# Patient Record
Sex: Female | Born: 1990 | Race: Black or African American | Hispanic: No | Marital: Single | Smoking: Never smoker
Health system: Southern US, Community
[De-identification: ages and names within clinical notes are randomized; demographics above are authoritative.]

## PROBLEM LIST (undated history)

## (undated) ENCOUNTER — Inpatient Hospital Stay (HOSPITAL_COMMUNITY): Payer: Self-pay

## (undated) ENCOUNTER — Emergency Department (HOSPITAL_COMMUNITY): Payer: Self-pay

## (undated) ENCOUNTER — Emergency Department (HOSPITAL_COMMUNITY): Admission: EM | Payer: Self-pay

## (undated) DIAGNOSIS — B999 Unspecified infectious disease: Secondary | ICD-10-CM

## (undated) DIAGNOSIS — L309 Dermatitis, unspecified: Secondary | ICD-10-CM

## (undated) DIAGNOSIS — K219 Gastro-esophageal reflux disease without esophagitis: Secondary | ICD-10-CM

## (undated) DIAGNOSIS — Z862 Personal history of diseases of the blood and blood-forming organs and certain disorders involving the immune mechanism: Secondary | ICD-10-CM

## (undated) DIAGNOSIS — T7840XA Allergy, unspecified, initial encounter: Secondary | ICD-10-CM

## (undated) DIAGNOSIS — A599 Trichomoniasis, unspecified: Secondary | ICD-10-CM

## (undated) DIAGNOSIS — D649 Anemia, unspecified: Secondary | ICD-10-CM

## (undated) DIAGNOSIS — A749 Chlamydial infection, unspecified: Secondary | ICD-10-CM

## (undated) DIAGNOSIS — A549 Gonococcal infection, unspecified: Secondary | ICD-10-CM

## (undated) HISTORY — DX: Anemia, unspecified: D64.9

## (undated) HISTORY — DX: Allergy, unspecified, initial encounter: T78.40XA

## (undated) HISTORY — DX: Gastro-esophageal reflux disease without esophagitis: K21.9

## (undated) HISTORY — PX: OTHER SURGICAL HISTORY: SHX169

## (undated) HISTORY — PX: WISDOM TOOTH EXTRACTION: SHX21

---
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
## 2012-06-14 ENCOUNTER — Inpatient Hospital Stay (HOSPITAL_COMMUNITY)
Admission: AD | Admit: 2012-06-14 | Discharge: 2012-06-14 | Disposition: A | Discharge status: Home / Self Care | Payer: Medicaid Other | Source: Ambulatory Visit | Attending: Family Medicine | Admitting: Family Medicine

## 2012-06-14 ENCOUNTER — Emergency Department (HOSPITAL_COMMUNITY)
Admission: EM | Admit: 2012-06-14 | Discharge: 2012-06-14 | Disposition: A | Discharge status: Home / Self Care | Payer: Self-pay | Attending: Emergency Medicine | Admitting: Emergency Medicine

## 2012-06-14 ENCOUNTER — Encounter (HOSPITAL_COMMUNITY): Payer: Self-pay | Admitting: *Deleted

## 2012-06-14 ENCOUNTER — Encounter (HOSPITAL_COMMUNITY): Payer: Self-pay | Admitting: Emergency Medicine

## 2012-06-14 DIAGNOSIS — N9489 Other specified conditions associated with female genital organs and menstrual cycle: Secondary | ICD-10-CM | POA: Insufficient documentation

## 2012-06-14 DIAGNOSIS — N898 Other specified noninflammatory disorders of vagina: Secondary | ICD-10-CM | POA: Insufficient documentation

## 2012-06-14 DIAGNOSIS — N9089 Other specified noninflammatory disorders of vulva and perineum: Secondary | ICD-10-CM

## 2012-06-14 DIAGNOSIS — N909 Noninflammatory disorder of vulva and perineum, unspecified: Secondary | ICD-10-CM | POA: Insufficient documentation

## 2012-06-14 HISTORY — DX: Unspecified infectious disease: B99.9

## 2012-06-14 HISTORY — DX: Dermatitis, unspecified: L30.9

## 2012-06-14 LAB — WET PREP, GENITAL: Trich, Wet Prep: NONE SEEN

## 2012-06-14 LAB — URINALYSIS, ROUTINE W REFLEX MICROSCOPIC
Bilirubin Urine: NEGATIVE
Glucose, UA: NEGATIVE mg/dL
Protein, ur: NEGATIVE mg/dL
Specific Gravity, Urine: 1.025 (ref 1.005–1.030)

## 2012-06-14 LAB — URINE MICROSCOPIC-ADD ON

## 2012-06-14 MED ORDER — TRIAMCINOLONE ACETONIDE 0.1 % EX CREA
TOPICAL_CREAM | Freq: Two times a day (BID) | CUTANEOUS | Status: DC
Start: 2012-06-14 — End: 2013-03-03

## 2012-06-14 NOTE — MAU Note (Signed)
Pt reports she noticed some swelling in her vaginal area that started today. Has some white discharge as well

## 2012-06-14 NOTE — ED Notes (Signed)
Pt c/o swelling to vaginal area onset today. Pt reports white discharge noted yesterday. Pt has had odor in urine x 1 month.

## 2012-06-14 NOTE — MAU Provider Note (Signed)
History     CSN: [id number]  Arrival date and time: 06/14/12 1758   First Provider Initiated Contact with Patient 06/14/12 1839      Chief Complaint  Patient presents with  . Groin Swelling   HPI [name] [name] 21 y.o. Comes to MAU with vulvar swelling which began this morning.  Denies any intercourse this week.    OB History    Grav Para Term Preterm Abortions TAB SAB Ect Mult Living   2 2 2       2       Past Medical History  Diagnosis Date  . Eczema   . Infection     chlamydia    Past Surgical History  Procedure Date  . No past surgeries     Family History  Problem Relation Age of Onset  . Other Neg Hx     History  Substance Use Topics  . Smoking status: Former Smoker    Types: Cigarettes  . Smokeless tobacco: Never Used  . Alcohol Use: No    Allergies: No Known Allergies  No prescriptions prior to admission    Review of Systems  Constitutional: Negative for fever.  Gastrointestinal: Negative for nausea, vomiting and abdominal pain.  Genitourinary: Negative for dysuria.       Vaginal discharge Vulvar swelling   Physical Exam   Blood pressure 132/65, pulse 70, temperature 97.5 F (36.4 C), temperature source Oral, resp. rate 18, height 5' 3.5" (1.613 m), weight 65.318 kg (144 lb), last menstrual period 06/07/2012, not currently breastfeeding.  Physical Exam  Nursing note and vitals reviewed. Constitutional: She is oriented to person, place, and time. She appears well-developed and well-nourished.  HENT:  Head: Normocephalic.  Eyes: EOM are normal.  Neck: Neck supple.  GI: Soft. There is no tenderness.  Genitourinary:       Entire clitoral hood is edematous as well as entire right labia minora.  No change in left labia minora.  No lesion seen. Speculum exam: Vagina - Small amount of creamy discharge, no odor Cervix - No contact bleeding Bimanual exam: Cervix closed Uterus non tender, normal size Adnexa non tender, no masses  bilaterally GC/Chlam, wet prep done Chaperone present for exam.  Musculoskeletal: Normal range of motion.  Neurological: She is alert and oriented to person, place, and time.  Skin: Skin is warm and dry.  Psychiatric: She has a normal mood and affect.    MAU Course  Procedures  MDM Results for orders placed during the hospital encounter of 06/14/12 (from the past 24 hour(s))  POCT PREGNANCY, URINE     Status: Normal   Collection Time   06/14/12  6:40 PM      Component Value Range   Preg Test, Ur NEGATIVE  NEGATIVE  WET PREP, GENITAL     Status: Abnormal   Collection Time   06/14/12  6:42 PM      Component Value Range   Yeast Wet Prep HPF POC NONE SEEN  NONE SEEN   Trich, Wet Prep NONE SEEN  NONE SEEN   Clue Cells Wet Prep HPF POC FEW (*) NONE SEEN   WBC, Wet Prep HPF POC FEW (*) NONE SEEN   Talked with client at length.  Has eczema which has flared lately.  Has recently changed laundry detergent, uses dryer sheets.  Also has "boy shorts" underwear which she frequently has to pull down from riding up.  Edema could be from contact dermatitis or irritation from clothing.  Assessment and  Plan  Vulvar swelling - possibly a contact dermatitis  Plan Change underwear to a type that does not ride up.   Avoid tight jeans and other tight clothing. Do not use dryer sheets. Change to a hypoallergenic body was  - Aveeno Use the same type of laundry detergent all the time. No infection is found today.  Cultures are pending. Try Benadryl over the counter for relief of swelling. rx Triamcinalone cream to affected area.  BURLESON,TERRI 06/14/2012, 7:11 PM

## 2012-06-14 NOTE — MAU Note (Addendum)
Swelling 'down there'.  (clitoral).  Noted this morning.  Was feeling a little irritated.  Denies recent intercourse, no stimulation (manual or vibrator). Changed to a scented ivory liquid soap, no other new changes. Past few wks urine has had a strong odor.

## 2012-06-14 NOTE — ED Notes (Signed)
Pt reports she is [facility]. Explained to pt we can see her here and she states that she would like to be seen at [facility] due to it being more appropriate.

## 2012-06-15 NOTE — MAU Provider Note (Signed)
Chart reviewed and agree with management and plan.  

## 2012-12-15 ENCOUNTER — Encounter (HOSPITAL_COMMUNITY): Payer: Self-pay | Admitting: *Deleted

## 2012-12-15 ENCOUNTER — Inpatient Hospital Stay (HOSPITAL_COMMUNITY)
Admission: AD | Admit: 2012-12-15 | Discharge: 2012-12-16 | Disposition: A | Discharge status: Home / Self Care | Payer: Self-pay | Source: Ambulatory Visit | Attending: Obstetrics & Gynecology | Admitting: Obstetrics & Gynecology

## 2012-12-15 DIAGNOSIS — O034 Incomplete spontaneous abortion without complication: Secondary | ICD-10-CM

## 2012-12-15 DIAGNOSIS — O021 Missed abortion: Secondary | ICD-10-CM | POA: Insufficient documentation

## 2012-12-15 DIAGNOSIS — R109 Unspecified abdominal pain: Secondary | ICD-10-CM | POA: Insufficient documentation

## 2012-12-15 LAB — CBC
HCT: 34.2 % — ABNORMAL LOW (ref 36.0–46.0)
MCH: 27.3 pg (ref 26.0–34.0)
MCHC: 32.7 g/dL (ref 30.0–36.0)
MCV: 83.4 fL (ref 78.0–100.0)
RDW: 16.4 % — ABNORMAL HIGH (ref 11.5–15.5)
WBC: 11.3 10*3/uL — ABNORMAL HIGH (ref 4.0–10.5)

## 2012-12-15 NOTE — MAU Note (Signed)
PT SAYS SHE DID PREG TEST IN MARCH 1,  POST.    PT SAYS SHE STARTED SPOTTING  ON 4-15-  - THEN OFF/ON  UNTIL   TODAY- THEN WORSE AT 6PM.   HAS A WASH CLOTH- IN UNDERWEAR- - SMALL AMT LIGHT RED  ON BLUE CLOTH.    SAYS PASSED CLOTS - NICKEL SIZE.     FEELS CRAMPS-  WHILE AT WORK-   WORSE LAST NIGHT.  Marland Kitchen  LAST SEX-  4-15.  NO BIRTH CONTROL.      DR MARSHALL AND J-M DEL- OTHER BABIES

## 2012-12-16 ENCOUNTER — Encounter (HOSPITAL_COMMUNITY): Payer: Self-pay

## 2012-12-16 ENCOUNTER — Inpatient Hospital Stay (HOSPITAL_COMMUNITY): Payer: Self-pay

## 2012-12-16 DIAGNOSIS — O034 Incomplete spontaneous abortion without complication: Secondary | ICD-10-CM

## 2012-12-16 LAB — WET PREP, GENITAL: Yeast Wet Prep HPF POC: NONE SEEN

## 2012-12-16 MED ORDER — HYDROCODONE-ACETAMINOPHEN 5-325 MG PO TABS: 1.0000 | ORAL_TABLET | Freq: Four times a day (QID) | ORAL | Status: DC | PRN

## 2012-12-16 MED ORDER — MISOPROSTOL 200 MCG PO TABS
800.0000 ug | ORAL_TABLET | Freq: Once | ORAL | Status: AC
  Administered 2012-12-16: 800 ug via VAGINAL
  Filled 2012-12-16: qty 4

## 2012-12-16 MED ORDER — PROMETHAZINE HCL 25 MG PO TABS: 25.0000 mg | ORAL_TABLET | Freq: Four times a day (QID) | ORAL | Status: DC | PRN

## 2012-12-16 NOTE — MAU Provider Note (Signed)
History     CSN: [id number]  Arrival date and time: 12/15/12 2213   First Provider Initiated Contact with Patient 12/16/12 0010      No chief complaint on file.  HPI This is a 22 y.o. female at about [redacted] weeks gestation who presents with c/o bleeding and cramping for one week. States was worse last night and today.   RN Note: PT SAYS SHE DID PREG TEST IN MARCH 1, POST. PT SAYS SHE STARTED SPOTTING ON 4-15- - THEN OFF/ON UNTIL TODAY- THEN WORSE AT 6PM. HAS A WASH CLOTH- IN UNDERWEAR- - SMALL AMT LIGHT RED ON BLUE CLOTH. SAYS PASSED CLOTS - NICKEL SIZE. FEELS CRAMPS- WHILE AT WORK- WORSE LAST NIGHT. Marland Kitchen LAST SEX- 4-15. NO BIRTH CONTROL. DR MARSHALL AND J-M DEL- OTHER BABIES  OB History   Grav Para Term Preterm Abortions TAB SAB Ect Mult Living   2 2 2       2       Past Medical History  Diagnosis Date  . Eczema   . Infection     chlamydia    Past Surgical History  Procedure Laterality Date  . No past surgeries      Family History  Problem Relation Age of Onset  . Other Neg Hx     History  Substance Use Topics  . Smoking status: Former Smoker    Types: Cigarettes  . Smokeless tobacco: Never Used  . Alcohol Use: No    Allergies: No Known Allergies  Prescriptions prior to admission  Medication Sig Dispense Refill  . cetirizine (ZYRTEC) 10 MG tablet Take 5 mg by mouth daily.      Marland Kitchen triamcinolone cream (KENALOG) 0.1 % Apply topically 2 (two) times daily.  30 g  0    Review of Systems  Constitutional: Negative for fever, chills and malaise/fatigue.  Gastrointestinal: Positive for abdominal pain. Negative for nausea, vomiting, diarrhea and constipation.  Genitourinary: Negative for dysuria.       Vaginal bleeding  Neurological: Negative for weakness and headaches.   Physical Exam   Blood pressure 121/67, pulse 97, temperature 98.1 F (36.7 C), temperature source Oral, resp. rate 20, height 5\' 2"  (1.575 m), weight 169 lb 8 oz (76.885 kg), last menstrual period  10/05/2012.  Physical Exam  Constitutional: She is oriented to person, place, and time. She appears well-developed and well-nourished. No distress.  Cardiovascular: Normal rate.   Respiratory: Effort normal.  GI: Soft. She exhibits no distension and no mass. There is tenderness (over uterus). There is no rebound and no guarding.  Genitourinary: Uterus normal. Vaginal discharge (moderate blood in vault, some membranous tissue at os) found.  Musculoskeletal: Normal range of motion.  Neurological: She is alert and oriented to person, place, and time.  Skin: Skin is warm and dry.  Psychiatric: She has a normal mood and affect.   Results for orders placed during the hospital encounter of 12/15/12 (from the past 24 hour(s))  HCG, QUANTITATIVE, PREGNANCY     Status: Abnormal   Collection Time    12/15/12 10:59 PM      Result Value Range   hCG, Beta Chain, Quant, S 5801 (*) <5 mIU/mL  CBC     Status: Abnormal   Collection Time    12/15/12 10:59 PM      Result Value Range   WBC 11.3 (*) 4.0 - 10.5 K/uL   RBC 4.10  3.87 - 5.11 MIL/uL   Hemoglobin 11.2 (*) 12.0 - 15.0 g/dL  HCT 34.2 (*) 36.0 - 46.0 %   MCV 83.4  78.0 - 100.0 fL   MCH 27.3  26.0 - 34.0 pg   MCHC 32.7  30.0 - 36.0 g/dL   RDW 96.0 (*) 45.4 - 09.8 %   Platelets 282  150 - 400 K/uL  WET PREP, GENITAL     Status: Abnormal   Collection Time    12/16/12 12:15 AM      Result Value Range   Yeast Wet Prep HPF POC NONE SEEN  NONE SEEN   Trich, Wet Prep NONE SEEN  NONE SEEN   Clue Cells Wet Prep HPF POC FEW (*) NONE SEEN   WBC, Wet Prep HPF POC FEW (*) NONE SEEN  US Ob Transvaginal  12/16/2012  *RADIOLOGY REPORT*  Clinical Data: 10 weeks 2 days pregnant by last menstrual period. Spotting for 1 week.  OBSTETRIC <14 WK [place] AND TRANSVAGINAL OB US  Technique:  Both transabdominal and transvaginal ultrasound examinations were performed for complete evaluation of the gestation as well as the maternal uterus, adnexal regions, and pelvic  cul-de-sac.  Transvaginal technique was performed to assess early pregnancy.  Comparison:  10/10/2009    Intrauterine gestational sac:  Visualized Yolk sac: Not visualized Embryo: Visualized Cardiac Activity: Absent  CRL: 21 mm  mm  8 w  5 d            [place] EDC: 07/23/2013.  Maternal uterus/adnexae: No subchorionic hemorrhage.  Both ovaries are normal in appearance, without adnexal mass. No significant free fluid.    IMPRESSION: Fetal pole with crown-rump length of 21 mm.  Absent cardiac activity, consistent with fetal demise.  Findings were relayed to the referring, Devoria Glassing, by the technologist, prior to this dictation at 1:30 am.     Original Report Authenticated By: Jeronimo Greaves, M.D.     MAU Course  Procedures  MDM  Discussed options of expectant management vs Cytotec.  Patient decided on cytotec but cannot bring herself to insert it herself at home. Wants it done here.   Early Intrauterine Pregnancy Failure Protocol  X Documented intrauterine pregnancy failure less than or equal to [redacted] weeks gestation  X No serious current illness  X Baseline Hgb greater than or equal to 10g/dl  X Patient has easily accessible transportation to the hospital  X Clear preference  X Practitioner/physician deems patient reliable  X Counseling by practitioner or physician  X Patient education by RN  X Consent form signed  Rho-Gam given by RN if indicated  X Medication dispensed  __ Cytotec 800 mcg Intravaginally by RN X Ibuprofen 600 mg 1 tablet by mouth every 6 hours as needed #30 - prescribed  X Vicodin by mouth every 4 to 6 hours as needed - prescribed  _x_ Phenergan 12.5 mg by mouth every 4 hours as needed for nausea - prescribed     Assessment and Plan  A:  SIUP at 10.2 weeks with fetal pole at 8+ weeks size      Failed IUP/Missed AB  P:  Cytotec inserted       Discharge home      Rx'es provided     Medication List    TAKE these medications       cetirizine 10 MG tablet   Commonly known as:  ZYRTEC  Take 5 mg by mouth daily.     HYDROcodone-acetaminophen 5-325 MG per tablet  Commonly known as:  NORCO/VICODIN  Take 1 tablet by mouth every 6 (six)  hours as needed for pain.     promethazine 25 MG tablet  Commonly known as:  PHENERGAN  Take 1 tablet (25 mg total) by mouth every 6 (six) hours as needed for nausea.     triamcinolone cream 0.1 %  Commonly known as:  KENALOG  Apply topically 2 (two) times daily.       Repeat quant in clinic in one week   Bleeding precautions   [facility] 12/16/2012, 1:04 AM

## 2012-12-17 NOTE — MAU Provider Note (Signed)
Attestation of Attending Supervision of Advanced Practitioner (PA/CNM/NP): Evaluation and management procedures were performed by the Advanced Practitioner under my supervision and collaboration.  I have reviewed the Advanced Practitioner's note and chart, and I agree with the management and plan.  Khaiden Segreto, MD, FACOG Attending Obstetrician & Gynecologist Faculty Practice, [facility]  

## 2012-12-23 ENCOUNTER — Other Ambulatory Visit: Payer: Self-pay

## 2013-01-27 ENCOUNTER — Encounter: Payer: Self-pay | Admitting: Obstetrics & Gynecology

## 2013-03-03 ENCOUNTER — Encounter (HOSPITAL_COMMUNITY): Payer: Self-pay | Admitting: *Deleted

## 2013-03-03 ENCOUNTER — Inpatient Hospital Stay (HOSPITAL_COMMUNITY)
Admission: AD | Admit: 2013-03-03 | Discharge: 2013-03-03 | Disposition: A | Discharge status: Home / Self Care | Payer: Self-pay | Source: Ambulatory Visit | Attending: Obstetrics & Gynecology | Admitting: Obstetrics & Gynecology

## 2013-03-03 DIAGNOSIS — R319 Hematuria, unspecified: Secondary | ICD-10-CM

## 2013-03-03 DIAGNOSIS — R109 Unspecified abdominal pain: Secondary | ICD-10-CM | POA: Insufficient documentation

## 2013-03-03 DIAGNOSIS — R1030 Lower abdominal pain, unspecified: Secondary | ICD-10-CM

## 2013-03-03 DIAGNOSIS — D573 Sickle-cell trait: Secondary | ICD-10-CM | POA: Insufficient documentation

## 2013-03-03 DIAGNOSIS — R3129 Other microscopic hematuria: Secondary | ICD-10-CM | POA: Insufficient documentation

## 2013-03-03 LAB — URINE MICROSCOPIC-ADD ON

## 2013-03-03 LAB — CBC WITH DIFFERENTIAL/PLATELET
Basophils Relative: 1 % (ref 0–1)
Eosinophils Absolute: 0.6 10*3/uL (ref 0.0–0.7)
Lymphs Abs: 2.3 10*3/uL (ref 0.7–4.0)
MCH: 24.5 pg — ABNORMAL LOW (ref 26.0–34.0)
MCHC: 31.4 g/dL (ref 30.0–36.0)
Neutrophils Relative %: 50 % (ref 43–77)
Platelets: 342 10*3/uL (ref 150–400)
RBC: 3.68 MIL/uL — ABNORMAL LOW (ref 3.87–5.11)

## 2013-03-03 LAB — URINALYSIS, ROUTINE W REFLEX MICROSCOPIC
Ketones, ur: NEGATIVE mg/dL
Nitrite: NEGATIVE
pH: 7.5 (ref 5.0–8.0)

## 2013-03-03 LAB — WET PREP, GENITAL
Trich, Wet Prep: NONE SEEN
Yeast Wet Prep HPF POC: NONE SEEN

## 2013-03-03 MED ORDER — CIPROFLOXACIN HCL 250 MG PO TABS: 500.0000 mg | ORAL_TABLET | Freq: Two times a day (BID) | ORAL | Status: DC

## 2013-03-03 NOTE — MAU Note (Signed)
Has been having cramping in lower abd for the past month.  At times feels like cycle is going to start, when she is not due.  Feels like stomach is getting bigger.  Did a home test and it was negative.

## 2013-03-03 NOTE — MAU Provider Note (Signed)
History     CSN: [id number]  Arrival date and time: 03/03/13 1428   First Provider Initiated Contact with Patient 03/03/13 1449      Chief Complaint  Patient presents with  . Abdominal Pain   HPI [name] [name] is 22 y.o. [id number] presents with abdominal pain x 1 month.  Describes as cramping even when not related to menses.   Worst pain 4-5/10, intermittent.  Now rates it as 2/10.  Has not tried OTC pain meds.  Nothing makes it worse or better.  Denies nausea, vomiting, UTI, or abnormal vaginal discharge or bleeding.  Last bowel movement yesterday, normal.  Periods are monthly lasting 6 days.  Sexually active X 1 partner for 3-4 months.  Not currently using contraception.  Wants her tubes tied but told she was too young.  Past Medical History  Diagnosis Date  . Eczema   . Infection     chlamydia    Past Surgical History  Procedure Laterality Date  . No past surgeries      Family History  Problem Relation Age of Onset  . Other Neg Hx   . Asthma Sister   . Heart disease Maternal Grandmother     History  Substance Use Topics  . Smoking status: Never Smoker   . Smokeless tobacco: Never Used  . Alcohol Use: No    Allergies: No Known Allergies  No prescriptions prior to admission    Review of Systems  Constitutional: Negative for fever and chills.  Gastrointestinal: Positive for abdominal pain. Negative for nausea, vomiting, diarrhea and constipation.  Genitourinary:       Negative for vaginal discharge or bleeding  Neurological: Negative for headaches.   Physical Exam   Blood pressure 112/67, pulse 95, temperature 98.2 F (36.8 C), temperature source Oral, resp. rate 18, height 5' 2.5" (1.588 m), weight 169 lb (76.658 kg), last menstrual period 02/14/2013.  Physical Exam  Constitutional: She is oriented to person, place, and time. She appears well-developed and well-nourished. No distress.  HENT:  Head: Normocephalic.  Neck: Normal range of motion.   Cardiovascular: Normal rate.   Respiratory: Effort normal.  GI: Soft. She exhibits no distension and no mass. There is tenderness. There is no rebound (mild  suprapubic discomfort) and no guarding.  Genitourinary: There is no rash, tenderness or lesion on the right labia. There is no rash, tenderness or lesion on the left labia. Uterus is not enlarged and not tender. Cervix exhibits no motion tenderness, no discharge and no friability. Right adnexum displays no mass, no tenderness and no fullness. Left adnexum displays no mass, no tenderness and no fullness. No erythema, tenderness or bleeding around the vagina. No foreign body around the vagina. Vaginal discharge (small amount of frothy discharge with mild odor) found.  Neurological: She is alert and oriented to person, place, and time.  Skin: Skin is warm and dry.  Psychiatric: She has a normal mood and affect. Her behavior is normal.   Results for orders placed during the hospital encounter of 03/03/13 (from the past 24 hour(s))  URINALYSIS, ROUTINE W REFLEX MICROSCOPIC     Status: Abnormal   Collection Time    03/03/13  2:50 PM      Result Value Range   Color, Urine YELLOW  YELLOW   APPearance CLOUDY (*) CLEAR   Specific Gravity, Urine 1.020  1.005 - 1.030   pH 7.5  5.0 - 8.0   Glucose, UA NEGATIVE  NEGATIVE mg/dL   Hgb urine  dipstick MODERATE (*) NEGATIVE   Bilirubin Urine NEGATIVE  NEGATIVE   Ketones, ur NEGATIVE  NEGATIVE mg/dL   Protein, ur NEGATIVE  NEGATIVE mg/dL   Urobilinogen, UA 0.2  0.0 - 1.0 mg/dL   Nitrite NEGATIVE  NEGATIVE   Leukocytes, UA LARGE (*) NEGATIVE  POCT PREGNANCY, URINE     Status: None   Collection Time    03/03/13  2:50 PM      Result Value Range   Preg Test, Ur NEGATIVE  NEGATIVE  URINE MICROSCOPIC-ADD ON     Status: Abnormal   Collection Time    03/03/13  2:50 PM      Result Value Range   Squamous Epithelial / LPF MANY (*) RARE   WBC, UA 11-20  <3 WBC/hpf   RBC / HPF 11-20  <3 RBC/hpf  CBC WITH  DIFFERENTIAL     Status: Abnormal   Collection Time    03/03/13  3:20 PM      Result Value Range   WBC 7.0  4.0 - 10.5 K/uL   RBC 3.68 (*) 3.87 - 5.11 MIL/uL   Hemoglobin 9.0 (*) 12.0 - 15.0 g/dL   HCT 40.9 (*) 81.1 - 91.4 %   MCV 78.0  78.0 - 100.0 fL   MCH 24.5 (*) 26.0 - 34.0 pg   MCHC 31.4  30.0 - 36.0 g/dL   RDW 78.2  95.6 - 21.3 %   Platelets 342  150 - 400 K/uL   Neutrophils Relative % 50  43 - 77 %   Neutro Abs 3.5  1.7 - 7.7 K/uL   Lymphocytes Relative 34  12 - 46 %   Lymphs Abs 2.3  0.7 - 4.0 K/uL   Monocytes Relative 8  3 - 12 %   Monocytes Absolute 0.5  0.1 - 1.0 K/uL   Eosinophils Relative 8 (*) 0 - 5 %   Eosinophils Absolute 0.6  0.0 - 0.7 K/uL   Basophils Relative 1  0 - 1 %   Basophils Absolute 0.1  0.0 - 0.1 K/uL  WET PREP, GENITAL     Status: Abnormal   Collection Time    03/03/13  3:40 PM      Result Value Range   Yeast Wet Prep HPF POC NONE SEEN  NONE SEEN   Trich, Wet Prep NONE SEEN  NONE SEEN   Clue Cells Wet Prep HPF POC NONE SEEN  NONE SEEN   WBC, Wet Prep HPF POC MANY (*) NONE SEEN    MAU Course  Procedures GC/CHL culture to lab MDM  Discussed labs with the patient.  When questioned her about low HGB, she reports sickle cell trait with history of low hgb.   Her sister has the disease.   Assessment and Plan  A:  Suprapubic pain with microscopic hematuria      Low Hemoglobin     Known sickle cell trait with hx of low hgb  P:  Rx for Cipro 250mg  bid X 3 days to pharmacy      Increase po fluids             Daris Harkins,EVE M 03/03/2013, 4:31 PM

## 2013-03-03 NOTE — MAU Provider Note (Signed)
Attestation of Attending Supervision of Advanced Practitioner (PA/CNM/NP): Evaluation and management procedures were performed by the Advanced Practitioner under my supervision and collaboration.  I have reviewed the Advanced Practitioner's note and chart, and I agree with the management and plan.  Kacie Huxtable, MD, FACOG Attending Obstetrician & Gynecologist Faculty Practice, [facility]  

## 2013-05-07 ENCOUNTER — Encounter (HOSPITAL_COMMUNITY): Payer: Self-pay | Admitting: *Deleted

## 2013-05-07 ENCOUNTER — Inpatient Hospital Stay (HOSPITAL_COMMUNITY)
Admission: AD | Admit: 2013-05-07 | Discharge: 2013-05-07 | Disposition: A | Discharge status: Home / Self Care | Payer: Self-pay | Source: Ambulatory Visit | Attending: Obstetrics & Gynecology | Admitting: Obstetrics & Gynecology

## 2013-05-07 ENCOUNTER — Emergency Department (HOSPITAL_COMMUNITY)
Admission: EM | Admit: 2013-05-07 | Discharge: 2013-05-07 | Discharge status: Against Medical Advice | Payer: Self-pay | Attending: Emergency Medicine | Admitting: Emergency Medicine

## 2013-05-07 ENCOUNTER — Encounter (HOSPITAL_COMMUNITY): Payer: Self-pay | Admitting: Family Medicine

## 2013-05-07 DIAGNOSIS — Z862 Personal history of diseases of the blood and blood-forming organs and certain disorders involving the immune mechanism: Secondary | ICD-10-CM | POA: Insufficient documentation

## 2013-05-07 DIAGNOSIS — O9989 Other specified diseases and conditions complicating pregnancy, childbirth and the puerperium: Secondary | ICD-10-CM | POA: Insufficient documentation

## 2013-05-07 DIAGNOSIS — A599 Trichomoniasis, unspecified: Secondary | ICD-10-CM | POA: Insufficient documentation

## 2013-05-07 DIAGNOSIS — Z3201 Encounter for pregnancy test, result positive: Secondary | ICD-10-CM | POA: Insufficient documentation

## 2013-05-07 DIAGNOSIS — R52 Pain, unspecified: Secondary | ICD-10-CM | POA: Insufficient documentation

## 2013-05-07 DIAGNOSIS — Z3490 Encounter for supervision of normal pregnancy, unspecified, unspecified trimester: Secondary | ICD-10-CM

## 2013-05-07 DIAGNOSIS — Z872 Personal history of diseases of the skin and subcutaneous tissue: Secondary | ICD-10-CM | POA: Insufficient documentation

## 2013-05-07 DIAGNOSIS — N898 Other specified noninflammatory disorders of vagina: Secondary | ICD-10-CM | POA: Insufficient documentation

## 2013-05-07 DIAGNOSIS — R252 Cramp and spasm: Secondary | ICD-10-CM | POA: Insufficient documentation

## 2013-05-07 LAB — POCT PREGNANCY, URINE: Preg Test, Ur: POSITIVE — AB

## 2013-05-07 LAB — CBC WITH DIFFERENTIAL/PLATELET
Basophils Absolute: 0 10*3/uL (ref 0.0–0.1)
Basophils Relative: 0 % (ref 0–1)
Eosinophils Absolute: 0.4 10*3/uL (ref 0.0–0.7)
Eosinophils Relative: 7 % — ABNORMAL HIGH (ref 0–5)
HCT: 32.5 % — ABNORMAL LOW (ref 36.0–46.0)
Hemoglobin: 10.5 g/dL — ABNORMAL LOW (ref 12.0–15.0)
MCH: 25.1 pg — ABNORMAL LOW (ref 26.0–34.0)
MCHC: 32.3 g/dL (ref 30.0–36.0)
MCV: 77.8 fL — ABNORMAL LOW (ref 78.0–100.0)
Monocytes Absolute: 0.4 10*3/uL (ref 0.1–1.0)
Monocytes Relative: 9 % (ref 3–12)
Neutro Abs: 2 10*3/uL (ref 1.7–7.7)
RDW: 17.5 % — ABNORMAL HIGH (ref 11.5–15.5)

## 2013-05-07 LAB — RETICULOCYTES
Retic Count, Absolute: 37.6 10*3/uL (ref 19.0–186.0)
Retic Ct Pct: 0.9 % (ref 0.4–3.1)

## 2013-05-07 LAB — URINE MICROSCOPIC-ADD ON

## 2013-05-07 LAB — URINALYSIS, ROUTINE W REFLEX MICROSCOPIC
Bilirubin Urine: NEGATIVE
Glucose, UA: NEGATIVE mg/dL
Ketones, ur: 15 mg/dL — AB
Nitrite: NEGATIVE
Protein, ur: 30 mg/dL — AB
pH: 6.5 (ref 5.0–8.0)

## 2013-05-07 LAB — COMPREHENSIVE METABOLIC PANEL
ALT: 15 U/L (ref 0–35)
BUN: 5 mg/dL — ABNORMAL LOW (ref 6–23)
CO2: 25 mEq/L (ref 19–32)
Calcium: 9.4 mg/dL (ref 8.4–10.5)
GFR calc Af Amer: >90 mL/min (ref >90)
GFR calc non Af Amer: >90 mL/min (ref >90)
Glucose, Bld: 91 mg/dL (ref 70–99)
Sodium: 135 mEq/L (ref 135–145)
Total Protein: 8 g/dL (ref 6.0–8.3)

## 2013-05-07 LAB — WET PREP, GENITAL: Clue Cells Wet Prep HPF POC: NONE SEEN

## 2013-05-07 MED ORDER — METRONIDAZOLE 500 MG PO TABS: 2000.0000 mg | ORAL_TABLET | Freq: Once | ORAL | Status: DC

## 2013-05-07 NOTE — ED Provider Notes (Signed)
CSN: [id number]     Arrival date & time 05/07/13  0945 History   First MD Initiated Contact with Patient 05/07/13 1001     Chief Complaint  Patient presents with  . Generalized Body Aches   (Consider location/radiation/quality/duration/timing/severity/associated sxs/prior Treatment) The history is provided by the patient and medical records.   Patient presents to the ED for generalized weakness, fatigue, and occasional cramping in upper and lower extremities occuring intermittently for the past 6 months.  No sx at present, last occurrence was yesterday afternoon. Patient does have sickle cell trait, both parents have sickle cell anemia.  Patient also endorses some new, white, nonodorous vaginal discharge for the past week. Denies any new sexual partners or concern for STD at this time. Patient does have a history of Chlamydia. No abdominal pain, nausea, vomiting, or diarrhea. No fevers, sweats, or chills.  No cough, congestion, shortness of breath, or other URI symptoms  No sick contacts.  Med record review-- pt has hx of recurrent anemia.  Past Medical History  Diagnosis Date  . Eczema   . Infection     chlamydia   Past Surgical History  Procedure Laterality Date  . No past surgeries     Family History  Problem Relation Age of Onset  . Other Neg Hx   . Asthma Sister   . Heart disease Maternal Grandmother    History  Substance Use Topics  . Smoking status: Never Smoker   . Smokeless tobacco: Never Used  . Alcohol Use: No   OB History   Grav Para Term Preterm Abortions TAB SAB Ect Mult Living   3 2 2  1  1   2      Review of Systems  Constitutional: Positive for fatigue.  Genitourinary: Positive for vaginal discharge.  All other systems reviewed and are negative.    Allergies  Review of patient's allergies indicates no known allergies.  Home Medications  No current outpatient prescriptions on file. BP 124/75  Pulse 93  Temp(Src) 97.7 F (36.5 C) (Oral)  Resp 20   SpO2 97%  LMP 04/07/2013  Physical Exam  Nursing note and vitals reviewed. Constitutional: She is oriented to person, place, and time. She appears well-developed and well-nourished. No distress.  HENT:  Head: Normocephalic and atraumatic.  Mouth/Throat: Oropharynx is clear and moist.  Eyes: Conjunctivae and EOM are normal. Pupils are equal, round, and reactive to light.  Neck: Normal range of motion. Neck supple.  Cardiovascular: Normal rate, regular rhythm and normal heart sounds.   Pulmonary/Chest: Effort normal and breath sounds normal. No respiratory distress. She has no wheezes.  Abdominal: Soft. Bowel sounds are normal. There is no tenderness. There is no guarding.  Genitourinary: There is no lesion on the right labia. There is no lesion on the left labia. Cervix exhibits no motion tenderness. Right adnexum displays no mass and no tenderness. Left adnexum displays no mass and no tenderness. No tenderness or bleeding around the vagina. Vaginal discharge found.  Cervical os closed; Copious, purulent, malodorous vaginal discharge; no bleeding; no adnexal or CMT  Musculoskeletal: Normal range of motion.  Neurological: She is alert and oriented to person, place, and time.  Skin: Skin is warm and dry. She is not diaphoretic.  Psychiatric: She has a normal mood and affect.    ED Course  Procedures (including critical care time) Labs Review Labs Reviewed  CBC WITH DIFFERENTIAL - Abnormal; Notable for the following:    Hemoglobin 10.5 (*)    HCT  32.5 (*)    MCV 77.8 (*)    MCH 25.1 (*)    RDW 17.5 (*)    Neutrophils Relative % 41 (*)    Eosinophils Relative 7 (*)    All other components within normal limits  COMPREHENSIVE METABOLIC PANEL - Abnormal; Notable for the following:    BUN 5 (*)    All other components within normal limits  WET PREP, GENITAL  GC/CHLAMYDIA PROBE AMP  RETICULOCYTES  URINALYSIS, ROUTINE W REFLEX MICROSCOPIC   Imaging Review No results found.  MDM    1. Pregnancy   2. Trichomoniasis     Labs largely WNL, no anemia or leukocytosis.  u-preg positive.  Pt notified of the results, this pregnancy was unplanned and she does not plan on keeping baby. Pt now G4P2.  Pelvic exam with large amount of purulent, malodorous vaginal discharge, suspicious for STD.  12:38 PM Nursing staff went in to check on pt, she was not there and room had already been cleaned.  U/a and wet prep still pending at this time, pt left without final evaluation/labs results/tx or d/c instructions.  12:55 PM U/a with many bacteria, trich noted.  Wet prep resulted-- too many trich to count.  Feel this needs urgent tx as pt is pregnant and this may cause complications-- notified flow manager, Alexia Freestone, she will contact pt and call into pharmacy.  Garlon Hatchet, PA-C 05/07/13 1304

## 2013-05-07 NOTE — ED Notes (Signed)
Per pt she has been having intermittent body aches and pain for a while. Per pt sts also some white discharge for 1 week.

## 2013-05-07 NOTE — ED Notes (Signed)
Pt c/o generalized weakness and occasion cramping in extremities for a few months. Pt states sickle cell runs in family. Denies pain at this time.

## 2013-05-07 NOTE — MAU Provider Note (Signed)
Chief Complaint: No chief complaint on file.   None    SUBJECTIVE HPI: [name] [name] is a 22 y.o. [id number] at 106w3d by LMP who presents to maternity admissions reporting she does not know how far along she is but had a pos preg test at [facility] today.  She denies pain, vaginal bleeding, vaginal itching/burning, urinary symptoms, h/a, dizziness, n/v, or fever/chills.    Past Medical History  Diagnosis Date  . Eczema   . Infection     chlamydia   Past Surgical History  Procedure Laterality Date  . No past surgeries     History   Social History  . Marital Status: Single    Spouse Name: N/A    Number of Children: N/A  . Years of Education: N/A   Occupational History  . Not on file.   Social History Main Topics  . Smoking status: Never Smoker   . Smokeless tobacco: Never Used  . Alcohol Use: No  . Drug Use: No  . Sexual Activity: Yes    Birth Control/ Protection: None     Comment: stopped pills recently - was not taking correctly   Other Topics Concern  . Not on file   Social History Narrative  . No narrative on file   No current facility-administered medications on file prior to encounter.   No current outpatient prescriptions on file prior to encounter.   No Known Allergies  ROS: Pertinent items in HPI  OBJECTIVE Blood pressure 121/77, pulse 101, temperature 97.6 F (36.4 C), temperature source Oral, resp. rate 18, height 5\' 5"  (1.651 m), weight 75.025 kg (165 lb 6.4 oz), last menstrual period 04/06/2013. GENERAL: Well-developed, well-nourished female in no acute distress.  HEENT: Normocephalic HEART: normal rate RESP: normal effort ABDOMEN: Soft, non-tender EXTREMITIES: Nontender, no edema NEURO: Alert and oriented  LAB RESULTS Results for orders placed during the hospital encounter of 05/07/13 (from the past 24 hour(s))  CBC WITH DIFFERENTIAL     Status: Abnormal   Collection Time    05/07/13 10:27 AM      Result Value Range   WBC 5.0  4.0 - 10.5 K/uL    RBC 4.18  3.87 - 5.11 MIL/uL   Hemoglobin 10.5 (*) 12.0 - 15.0 g/dL   HCT 45.4 (*) 09.8 - 11.9 %   MCV 77.8 (*) 78.0 - 100.0 fL   MCH 25.1 (*) 26.0 - 34.0 pg   MCHC 32.3  30.0 - 36.0 g/dL   RDW 14.7 (*) 82.9 - 56.2 %   Platelets 213  150 - 400 K/uL   Neutrophils Relative % 41 (*) 43 - 77 %   Neutro Abs 2.0  1.7 - 7.7 K/uL   Lymphocytes Relative 43  12 - 46 %   Lymphs Abs 2.2  0.7 - 4.0 K/uL   Monocytes Relative 9  3 - 12 %   Monocytes Absolute 0.4  0.1 - 1.0 K/uL   Eosinophils Relative 7 (*) 0 - 5 %   Eosinophils Absolute 0.4  0.0 - 0.7 K/uL   Basophils Relative 0  0 - 1 %   Basophils Absolute 0.0  0.0 - 0.1 K/uL   RBC Morphology ELLIPTOCYTES     Smear Review PLATELET CLUMPS NOTED ON SMEAR    COMPREHENSIVE METABOLIC PANEL     Status: Abnormal   Collection Time    05/07/13 10:27 AM      Result Value Range   Sodium 135  135 - 145 mEq/L   Potassium  4.1  3.5 - 5.1 mEq/L   Chloride 100  96 - 112 mEq/L   CO2 25  19 - 32 mEq/L   Glucose, Bld 91  70 - 99 mg/dL   BUN 5 (*) 6 - 23 mg/dL   Creatinine, Ser 3.08  0.50 - 1.10 mg/dL   Calcium 9.4  8.4 - 65.7 mg/dL   Total Protein 8.0  6.0 - 8.3 g/dL   Albumin 4.1  3.5 - 5.2 g/dL   AST 19  0 - 37 U/L   ALT 15  0 - 35 U/L   Alkaline Phosphatase 62  39 - 117 U/L   Total Bilirubin 0.3  0.3 - 1.2 mg/dL   GFR calc non Af Amer >90  >90 mL/min   GFR calc Af Amer >90  >90 mL/min  RETICULOCYTES     Status: None   Collection Time    05/07/13 10:27 AM      Result Value Range   Retic Ct Pct 0.9  0.4 - 3.1 %   RBC. 4.18  3.87 - 5.11 MIL/uL   Retic Count, Manual 37.6  19.0 - 186.0 K/uL  URINALYSIS, ROUTINE W REFLEX MICROSCOPIC     Status: Abnormal   Collection Time    05/07/13 11:13 AM      Result Value Range   Color, Urine YELLOW  YELLOW   APPearance CLOUDY (*) CLEAR   Specific Gravity, Urine 1.026  1.005 - 1.030   pH 6.5  5.0 - 8.0   Glucose, UA NEGATIVE  NEGATIVE mg/dL   Hgb urine dipstick LARGE (*) NEGATIVE   Bilirubin Urine  NEGATIVE  NEGATIVE   Ketones, ur 15 (*) NEGATIVE mg/dL   Protein, ur 30 (*) NEGATIVE mg/dL   Urobilinogen, UA 0.2  0.0 - 1.0 mg/dL   Nitrite NEGATIVE  NEGATIVE   Leukocytes, UA LARGE (*) NEGATIVE  URINE MICROSCOPIC-ADD ON     Status: Abnormal   Collection Time    05/07/13 11:13 AM      Result Value Range   Squamous Epithelial / LPF MANY (*) RARE   WBC, UA TOO NUMEROUS TO COUNT  <3 WBC/hpf   RBC / HPF 21-50  <3 RBC/hpf   Bacteria, UA MANY (*) RARE   Urine-Other TRICHOMONAS PRESENT    POCT PREGNANCY, URINE     Status: Abnormal   Collection Time    05/07/13 11:36 AM      Result Value Range   Preg Test, Ur POSITIVE (*) NEGATIVE  WET PREP, GENITAL     Status: Abnormal   Collection Time    05/07/13 11:59 AM      Result Value Range   Yeast Wet Prep HPF POC NONE SEEN  NONE SEEN   Trich, Wet Prep TOO NUMEROUS TO COUNT (*) NONE SEEN   Clue Cells Wet Prep HPF POC NONE SEEN  NONE SEEN   WBC, Wet Prep HPF POC TOO NUMEROUS TO COUNT (*) NONE SEEN     ASSESSMENT 1. Positive pregnancy test     PLAN Discharge home Outpatient U/S ordered for viability F/U with prenatal care at pt choice of provider Return to MAU as needed    Medication List    Notice   You have not been prescribed any medications.         Follow-up Information   Follow up with [facility]. (Or prenatal provider of your choice.  Ultrasound will call you or call [phone] and ask for ultrasound to schedule.  Return  to MAU as needed.)    Specialty:  Obstetrics and Gynecology   Contact information:   [address] [place] [address] [phone]      Sharen Counter Certified Nurse-Midwife 05/07/2013  4:38 PM

## 2013-05-07 NOTE — MAU Note (Signed)
Pt seen in [facility] this a.m., had pos UPT.  Came to MAU to see how far along she is.  Pt denies pain or bleeding.

## 2013-05-07 NOTE — ED Notes (Signed)
Pelvic cart at bedside. 

## 2013-05-07 NOTE — ED Notes (Signed)
Patient left against medical advice before receiving lab results.  PA notified and PA notified flow manager to call patient to receive antibiotics.

## 2013-05-08 LAB — URINE CULTURE: Colony Count: 50000

## 2013-05-08 NOTE — ED Provider Notes (Signed)
Medical screening examination/treatment/procedure(s) were performed by non-physician practitioner and as supervising physician I was immediately available for consultation/collaboration.  Flint Melter, MD 05/08/13 1000

## 2013-05-09 LAB — GC/CHLAMYDIA PROBE AMP: GC Probe RNA: NEGATIVE

## 2013-05-10 NOTE — ED Notes (Signed)
+   chlamydia, Chart sent to EDP for review.

## 2013-05-11 ENCOUNTER — Encounter (HOSPITAL_COMMUNITY): Payer: Self-pay | Admitting: *Deleted

## 2013-05-11 ENCOUNTER — Telehealth: Payer: Self-pay | Admitting: Advanced Practice Midwife

## 2013-05-11 ENCOUNTER — Inpatient Hospital Stay (HOSPITAL_COMMUNITY)
Admission: AD | Admit: 2013-05-11 | Discharge: 2013-05-11 | Discharge status: Against Medical Advice | Payer: Self-pay | Source: Ambulatory Visit | Attending: Obstetrics & Gynecology | Admitting: Obstetrics & Gynecology

## 2013-05-11 DIAGNOSIS — O26859 Spotting complicating pregnancy, unspecified trimester: Secondary | ICD-10-CM | POA: Insufficient documentation

## 2013-05-11 HISTORY — DX: Chlamydial infection, unspecified: A74.9

## 2013-05-11 HISTORY — DX: Trichomoniasis, unspecified: A59.9

## 2013-05-11 LAB — URINALYSIS, ROUTINE W REFLEX MICROSCOPIC
Bilirubin Urine: NEGATIVE
Ketones, ur: NEGATIVE mg/dL
Nitrite: NEGATIVE
Protein, ur: NEGATIVE mg/dL
Urobilinogen, UA: 0.2 mg/dL (ref 0.0–1.0)

## 2013-05-11 NOTE — Telephone Encounter (Signed)
Pt came to MAU with abdominal pain and spotting in early pregnancy but had to leave AMA to pick up her child before being evaluated.  She did leave a urine sample today, 05/11/13.  Called pt to report her tests results at [facility] ED on 05/07/13 were positive for trichomonas and chlamydia.  Trichomonas also present in urine today in MAU.  Also encouraged pt to return to MAU for evaluation for spotting/abominal pain.  Pt reports she will return to MAU tomorrow for evaluation/STD treatment.

## 2013-05-11 NOTE — MAU Note (Signed)
Pt at desk signing AMA form with tech, informed pt she had some lab results that needed to be reviewed with her & that she would need some medications.  Pt states she got a call to pick up her child.  "They can call me with results."  CNM informed.

## 2013-05-11 NOTE — MAU Note (Signed)
Pt seen in MAU on 9/12, started bleeding the next day - spotting, hasn't noted bleeding yet today.  Lower abd cramping.

## 2013-05-12 ENCOUNTER — Inpatient Hospital Stay (HOSPITAL_COMMUNITY): Payer: Self-pay

## 2013-05-12 ENCOUNTER — Encounter (HOSPITAL_COMMUNITY): Payer: Self-pay

## 2013-05-12 ENCOUNTER — Inpatient Hospital Stay (HOSPITAL_COMMUNITY)
Admission: AD | Admit: 2013-05-12 | Discharge: 2013-05-12 | Disposition: A | Discharge status: Home / Self Care | Payer: Self-pay | Source: Ambulatory Visit | Attending: Obstetrics & Gynecology | Admitting: Obstetrics & Gynecology

## 2013-05-12 DIAGNOSIS — A599 Trichomoniasis, unspecified: Secondary | ICD-10-CM

## 2013-05-12 DIAGNOSIS — A5901 Trichomonal vulvovaginitis: Secondary | ICD-10-CM | POA: Insufficient documentation

## 2013-05-12 DIAGNOSIS — A749 Chlamydial infection, unspecified: Secondary | ICD-10-CM

## 2013-05-12 DIAGNOSIS — O98319 Other infections with a predominantly sexual mode of transmission complicating pregnancy, unspecified trimester: Secondary | ICD-10-CM | POA: Insufficient documentation

## 2013-05-12 DIAGNOSIS — N739 Female pelvic inflammatory disease, unspecified: Secondary | ICD-10-CM | POA: Insufficient documentation

## 2013-05-12 DIAGNOSIS — O98819 Other maternal infectious and parasitic diseases complicating pregnancy, unspecified trimester: Secondary | ICD-10-CM | POA: Insufficient documentation

## 2013-05-12 DIAGNOSIS — O26899 Other specified pregnancy related conditions, unspecified trimester: Secondary | ICD-10-CM

## 2013-05-12 DIAGNOSIS — R109 Unspecified abdominal pain: Secondary | ICD-10-CM | POA: Insufficient documentation

## 2013-05-12 DIAGNOSIS — A5619 Other chlamydial genitourinary infection: Secondary | ICD-10-CM | POA: Insufficient documentation

## 2013-05-12 DIAGNOSIS — O9989 Other specified diseases and conditions complicating pregnancy, childbirth and the puerperium: Secondary | ICD-10-CM

## 2013-05-12 LAB — CBC
HCT: 31.3 % — ABNORMAL LOW (ref 36.0–46.0)
Hemoglobin: 10.1 g/dL — ABNORMAL LOW (ref 12.0–15.0)
MCV: 79.6 fL (ref 78.0–100.0)
Platelets: 316 10*3/uL (ref 150–400)
RBC: 3.93 MIL/uL (ref 3.87–5.11)
WBC: 6.3 10*3/uL (ref 4.0–10.5)

## 2013-05-12 LAB — HCG, QUANTITATIVE, PREGNANCY: hCG, Beta Chain, Quant, S: 924 m[IU]/mL — ABNORMAL HIGH (ref <5)

## 2013-05-12 MED ORDER — METRONIDAZOLE 500 MG PO TABS
2000.0000 mg | ORAL_TABLET | Freq: Once | ORAL | Status: AC
  Administered 2013-05-12: 2000 mg via ORAL
  Filled 2013-05-12: qty 4

## 2013-05-12 MED ORDER — ONDANSETRON 8 MG PO TBDP
8.0000 mg | ORAL_TABLET | Freq: Once | ORAL | Status: AC
  Administered 2013-05-12: 8 mg via ORAL
  Filled 2013-05-12: qty 1

## 2013-05-12 MED ORDER — AZITHROMYCIN 250 MG PO TABS
1000.0000 mg | ORAL_TABLET | Freq: Once | ORAL | Status: AC
  Administered 2013-05-12: 1000 mg via ORAL
  Filled 2013-05-12: qty 4

## 2013-05-12 NOTE — MAU Note (Signed)
Patient has been seen at [facility] ED on 9-12 and MAU on 9-16 and left before being completely evaluated. Has had positive cultures and was phoned and told to return for treatment. Patient states she is still having cramping.

## 2013-05-12 NOTE — MAU Note (Signed)
Patient states that she is in because she received a call yesterday to come back and get treated for an infection and ultrasound. She denies pain (she states that she had a slight cramping last night) or vaginal bleeding today.

## 2013-05-12 NOTE — MAU Provider Note (Signed)
History     CSN: [id number]  Arrival date and time: 05/12/13 1308   First Provider Initiated Contact with Patient 05/12/13 0920      Chief Complaint  Patient presents with  . Exposure to STD   HPI [name] is a 22 y.o. [id number] at [redacted]w[redacted]d who presents to MAU today for STD treatment and lower abdominal cramping. The patient was seen at [facility] on 05/07/13 and had +trichomonas and chlamydia. She came to MAU for further evaluation of lower abdominal pain and +HPT and left AMA because she had to pick up her son. She was contacted by Sharen Counter, CNM with positive results and told to come back in for treatment and further evaluation. She states that she continues to have lower abdominal cramping that is rated at 6/10 at the worst. She denies pain now, but had pain earlier today and overnight. She denies vaginal bleeding, discharge, dizziness, weakness, fatigue, fever, N/V/D or constipation or UTI symptoms.    OB History   Grav Para Term Preterm Abortions TAB SAB Ect Mult Living   4 2 2  1  1   2       Past Medical History  Diagnosis Date  . Eczema   . Infection     chlamydia  . Trichomonas   . Chlamydia     Past Surgical History  Procedure Laterality Date  . Wisdom tooth extraction    . Fractured finger as a child      Family History  Problem Relation Age of Onset  . Other Neg Hx   . Asthma Sister   . Heart disease Maternal Grandmother     History  Substance Use Topics  . Smoking status: Never Smoker   . Smokeless tobacco: Never Used  . Alcohol Use: No    Allergies: No Known Allergies  No prescriptions prior to admission    Review of Systems  Constitutional: Negative for fever and malaise/fatigue.  Gastrointestinal: Positive for abdominal pain. Negative for nausea, vomiting, diarrhea and constipation.  Genitourinary: Negative for dysuria, urgency and frequency.       Neg - vaginal bleeding, discharge  Neurological: Negative for dizziness, loss of  consciousness and weakness.   Physical Exam   Blood pressure 131/65, pulse 94, resp. rate 18, height 5\' 4"  (1.626 m), weight 167 lb 6.4 oz (75.932 kg), last menstrual period 04/06/2013.  Physical Exam  Constitutional: She is oriented to person, place, and time. She appears well-developed and well-nourished. No distress.  HENT:  Head: Normocephalic and atraumatic.  Cardiovascular: Normal rate, regular rhythm and normal heart sounds.   Respiratory: Effort normal and breath sounds normal. No respiratory distress.  GI: Soft. Bowel sounds are normal. She exhibits no distension and no mass. There is no tenderness. There is no rebound and no guarding.  Neurological: She is alert and oriented to person, place, and time.  Skin: Skin is warm and dry. No erythema.  Psychiatric: She has a normal mood and affect.   Results for orders placed during the hospital encounter of 05/12/13 (from the past 24 hour(s))  CBC     Status: Abnormal   Collection Time    05/12/13  9:41 AM      Result Value Range   WBC 6.3  4.0 - 10.5 K/uL   RBC 3.93  3.87 - 5.11 MIL/uL   Hemoglobin 10.1 (*) 12.0 - 15.0 g/dL   HCT 62.9 (*) 52.8 - 41.3 %   MCV 79.6  78.0 - 100.0  fL   MCH 25.7 (*) 26.0 - 34.0 pg   MCHC 32.3  30.0 - 36.0 g/dL   RDW 16.1 (*) 09.6 - 04.5 %   Platelets 316  150 - 400 K/uL  HCG, QUANTITATIVE, PREGNANCY     Status: Abnormal   Collection Time    05/12/13  9:41 AM      Result Value Range   hCG, Beta Chain, Quant, S 924 (*) <5 mIU/mL   US Ob Comp Less 14 Wks  05/12/2013   *RADIOLOGY REPORT*  Clinical Data: Pelvic cramping, positive pregnancy test.  Prior intrauterine fetal demise is documented on prior exam 12/16/2012.  OBSTETRIC <14 WK [place] AND TRANSVAGINAL OB US  Technique:  Both transabdominal and transvaginal ultrasound examinations were performed for complete evaluation of the gestation as well as the maternal uterus, adnexal regions, and pelvic cul-de-sac.  Transvaginal technique was performed to  assess early pregnancy.  Comparison:  12/16/2012  Intrauterine gestational sac:  Not visualized Yolk sac: Not visualized Embryo: Not visualized Cardiac Activity: Not visualized  Maternal uterus/adnexae: The ovaries are normal.  No adnexal mass.  IMPRESSION: No intrauterine gestational sac, yolk sac, fetal pole, or cardiac activity visualized. Differential considerations include intrauterine gestation too early to be sonographically visualized, spontaneous abortion, or ectopic pregnancy.  Consider follow-up ultrasound in 10-14 days and serial quantitative beta HCG follow- up.   Original Report Authenticated By: Christiana Pellant, M.D.   US Ob Transvaginal  05/12/2013   *RADIOLOGY REPORT*  Clinical Data: Pelvic cramping, positive pregnancy test.  Prior intrauterine fetal demise is documented on prior exam 12/16/2012.  OBSTETRIC <14 WK [place] AND TRANSVAGINAL OB US  Technique:  Both transabdominal and transvaginal ultrasound examinations were performed for complete evaluation of the gestation as well as the maternal uterus, adnexal regions, and pelvic cul-de-sac.  Transvaginal technique was performed to assess early pregnancy.  Comparison:  12/16/2012  Intrauterine gestational sac:  Not visualized Yolk sac: Not visualized Embryo: Not visualized Cardiac Activity: Not visualized  Maternal uterus/adnexae: The ovaries are normal.  No adnexal mass.  IMPRESSION: No intrauterine gestational sac, yolk sac, fetal pole, or cardiac activity visualized. Differential considerations include intrauterine gestation too early to be sonographically visualized, spontaneous abortion, or ectopic pregnancy.  Consider follow-up ultrasound in 10-14 days and serial quantitative beta HCG follow- up.   Original Report Authenticated By: Christiana Pellant, M.D.    MAU Course  Procedures None  MDM A+ blood type from previous visit in Epic UA, CBC, quant hCG and [place] today Will treat CT and Trichomonas from previous visits 1 G Zithromax and 2 G  Flagyl given with ODT Zofran in MAU Discussed partner treatment needed  Assessment and Plan  A: Trichomonas Chlamydia Abdominal pain in early pregnancy, antepartum  P: Discharge home Patient advised to take Tylenol PRN pain Patient to return to MAU in 48 hours for follow-up quant hCG Bleeding/ectopic precautions reviewed Patient may return to MAU as needed sooner  Freddi Starr, PA-C  05/12/2013, 3:46 PM

## 2013-05-13 NOTE — MAU Provider Note (Signed)
Attestation of Attending Supervision of Advanced Practitioner (PA/CNM/NP): Evaluation and management procedures were performed by the Advanced Practitioner under my supervision and collaboration.  I have reviewed the Advanced Practitioner's note and chart, and I agree with the management and plan.  Alera Quevedo, MD, FACOG Attending Obstetrician & Gynecologist Faculty Practice, [facility]  

## 2013-05-14 ENCOUNTER — Inpatient Hospital Stay (HOSPITAL_COMMUNITY)
Admission: AD | Admit: 2013-05-14 | Discharge: 2013-05-14 | Disposition: A | Discharge status: Home / Self Care | Payer: Self-pay | Source: Ambulatory Visit | Attending: Obstetrics and Gynecology | Admitting: Obstetrics and Gynecology

## 2013-05-14 DIAGNOSIS — Z202 Contact with and (suspected) exposure to infections with a predominantly sexual mode of transmission: Secondary | ICD-10-CM | POA: Insufficient documentation

## 2013-05-14 DIAGNOSIS — O26899 Other specified pregnancy related conditions, unspecified trimester: Secondary | ICD-10-CM

## 2013-05-14 DIAGNOSIS — O99891 Other specified diseases and conditions complicating pregnancy: Secondary | ICD-10-CM | POA: Insufficient documentation

## 2013-05-14 DIAGNOSIS — O9989 Other specified diseases and conditions complicating pregnancy, childbirth and the puerperium: Secondary | ICD-10-CM

## 2013-05-14 DIAGNOSIS — R109 Unspecified abdominal pain: Secondary | ICD-10-CM | POA: Insufficient documentation

## 2013-05-14 NOTE — MAU Note (Signed)
[name] [name] Either PA spoke to pt in Triage regarding lab results and d/c plan made. Pt d/c from Triage

## 2013-05-14 NOTE — MAU Provider Note (Signed)
[name] [name] is a 22 y.o. [phone] at [redacted]w[redacted]d who presents to MAU today for follow-up quant hCG. The patient had quant hCG of 924 on 05/12/13. The patient denies pain or bleeding today. She does state that after being treated for Trichomonas and Chlamydia at her last visit she had intercourse with her "babies father" who had not yet been treated and the condom broke.  LMP 04/06/2013 GENERAL: Well-developed, well-nourished female in no acute distress.  HEENT: Normocephalic, atraumatic.   LUNGS: Effort normal HEART: Regular rate  SKIN: Warm, dry and without erythema PSYCH: Normal mood and affect  Results for orders placed during the hospital encounter of 05/14/13 (from the past 24 hour(s))  HCG, QUANTITATIVE, PREGNANCY     Status: Abnormal   Collection Time    05/14/13  6:36 PM      Result Value Range   hCG, Beta Chain, Quant, S 2483 (*) <5 mIU/mL   MDM Patient will return to MAU in 1 week for [place] results and will need STD check again at that time.   A: Appropriate rise in quant hCG  P: Discharge home [place] ordered for confirmation on 05/21/13. They will call patient with an appointment Patient will follow-up in MAU after [place] for results and STD check Ectopic precautions discussed Patient may return to MAU as needed sooner  Freddi Starr, PA-C 05/14/2013 7:22 PM

## 2013-05-17 NOTE — ED Notes (Addendum)
Patient needs Azithromycin 1 gram orally.Test of cure in 4 weeks per Arthor Captain PAC-rx called to [facility] point [phone] by Alinda Money PFM

## 2013-05-21 ENCOUNTER — Ambulatory Visit (HOSPITAL_COMMUNITY): Payer: Self-pay

## 2013-05-25 ENCOUNTER — Ambulatory Visit (HOSPITAL_COMMUNITY): Payer: Self-pay

## 2013-05-27 ENCOUNTER — Ambulatory Visit (HOSPITAL_COMMUNITY): Payer: Self-pay | Attending: Advanced Practice Midwife

## 2013-06-02 ENCOUNTER — Ambulatory Visit (HOSPITAL_COMMUNITY): Payer: Self-pay

## 2013-06-03 ENCOUNTER — Ambulatory Visit (HOSPITAL_COMMUNITY): Payer: Self-pay | Attending: Advanced Practice Midwife

## 2013-06-10 ENCOUNTER — Ambulatory Visit (HOSPITAL_COMMUNITY): Payer: Self-pay | Attending: Advanced Practice Midwife

## 2013-06-17 ENCOUNTER — Ambulatory Visit (HOSPITAL_COMMUNITY): Payer: Medicaid Other | Attending: Advanced Practice Midwife

## 2013-07-06 ENCOUNTER — Inpatient Hospital Stay (HOSPITAL_COMMUNITY)
Admission: AD | Admit: 2013-07-06 | Discharge: 2013-07-06 | Disposition: A | Discharge status: Home / Self Care | Payer: Medicaid Other | Source: Ambulatory Visit | Attending: Obstetrics & Gynecology | Admitting: Obstetrics & Gynecology

## 2013-07-06 ENCOUNTER — Encounter (HOSPITAL_COMMUNITY): Payer: Self-pay | Admitting: *Deleted

## 2013-07-06 DIAGNOSIS — O209 Hemorrhage in early pregnancy, unspecified: Secondary | ICD-10-CM

## 2013-07-06 DIAGNOSIS — O26859 Spotting complicating pregnancy, unspecified trimester: Secondary | ICD-10-CM | POA: Insufficient documentation

## 2013-07-06 DIAGNOSIS — R109 Unspecified abdominal pain: Secondary | ICD-10-CM | POA: Insufficient documentation

## 2013-07-06 DIAGNOSIS — O99891 Other specified diseases and conditions complicating pregnancy: Secondary | ICD-10-CM | POA: Insufficient documentation

## 2013-07-06 DIAGNOSIS — O4690 Antepartum hemorrhage, unspecified, unspecified trimester: Secondary | ICD-10-CM

## 2013-07-06 LAB — URINALYSIS, ROUTINE W REFLEX MICROSCOPIC
Glucose, UA: NEGATIVE mg/dL
Protein, ur: 30 mg/dL — AB

## 2013-07-06 LAB — URINE MICROSCOPIC-ADD ON

## 2013-07-06 LAB — WET PREP, GENITAL
Clue Cells Wet Prep HPF POC: NONE SEEN
Trich, Wet Prep: NONE SEEN
Yeast Wet Prep HPF POC: NONE SEEN

## 2013-07-06 NOTE — MAU Note (Signed)
Patient states she has not started prenatal care and no plans with a provider. States she has had red spotting on tissue and abdominal cramping off and on for about 2 months. Patient states she is not having any pain or bleeding at this time.

## 2013-07-06 NOTE — MAU Note (Signed)
Patient presents with abdominal cramping intermittently X 1 month.

## 2013-07-06 NOTE — MAU Provider Note (Signed)
History   CSN: [id number]  Arrival date and time: 07/06/13 1016   First Provider Initiated Contact with Patient 07/06/13 1154      Chief Complaint  Patient presents with  . Abdominal Cramping   HPI  [name] [name] is a 22 y.o. [id number] at [redacted]w[redacted]d She presents today with complaint of "some spotting / blood when wiping after using the bathroom." She also endorses some suprapubic discomfort. [name] [name] is 13w pregnant with no prenatal care aside from symptomatic visits to the MAU and [facility] ED for trichomonas and chlamydia infection. The patient has previously left the MAU AMA for this same complaint. She also endorses being nonadherent to recommended treatment plans including abstinence from intercourse during the treatment period for both her and her partner, as well as follow up for ultrasound to confirm her current pregnancy is intrauterine. FHT present at 162 today.    OB History   Grav Para Term Preterm Abortions TAB SAB Ect Mult Living   4 2 2  1  1   2       Past Medical History  Diagnosis Date  . Eczema   . Infection     chlamydia  . Trichomonas   . Chlamydia     Past Surgical History  Procedure Laterality Date  . Wisdom tooth extraction    . Fractured finger as a child      Family History  Problem Relation Age of Onset  . Other Neg Hx   . Asthma Sister   . Heart disease Maternal Grandmother     History  Substance Use Topics  . Smoking status: Never Smoker   . Smokeless tobacco: Never Used  . Alcohol Use: No    Allergies: No Known Allergies  No prescriptions prior to admission    Review of Systems  Constitutional: Negative.  Negative for fever, chills and weight loss.  HENT: Negative.   Eyes: Negative.  Negative for blurred vision, double vision and discharge.  Respiratory: Negative.  Negative for shortness of breath and wheezing.   Cardiovascular: Negative.  Negative for chest pain and palpitations.  Gastrointestinal: Negative.  Negative  for heartburn, nausea, vomiting, abdominal pain, diarrhea, constipation, blood in stool and melena.  Genitourinary: Positive for urgency and frequency. Negative for dysuria, hematuria and flank pain.       Pt reports no urinary pain, but some suprapubic comfort, nocturia x2, and some urinary urgency  Musculoskeletal: Negative.   Skin: Negative.  Negative for itching and rash.  Neurological: Negative.  Negative for dizziness and headaches.  Endo/Heme/Allergies: Negative.   Psychiatric/Behavioral: Negative.  Negative for depression.   Physical Exam   Blood pressure 114/53, pulse 85, temperature 98.2 F (36.8 C), resp. rate 16, height 5\' 4"  (1.626 m), weight 73.483 kg (162 lb), last menstrual period 04/06/2013, SpO2 99.00%.  Physical Exam  Constitutional: She is oriented to person, place, and time. She appears well-developed and well-nourished. No distress.  HENT:  Right Ear: External ear normal.  Left Ear: External ear normal.  Eyes: Right eye exhibits no discharge. Left eye exhibits no discharge.  Neck: No JVD present. No tracheal deviation present.  Cardiovascular: Normal rate and normal heart sounds.  Exam reveals no gallop and no friction rub.   No murmur heard. Respiratory: Effort normal. No stridor. No respiratory distress. She has no wheezes. She has no rales. She exhibits no tenderness.  GI: Soft. Bowel sounds are normal. She exhibits no distension and no mass. There is tenderness (minimal suprapubic discomfort  on deep palpation). There is no rebound and no guarding.  Genitourinary: Vagina normal.  Normal female genitalia and anorectal area - no lesions, deformity, or discharge. Bimanual exam unremarkable. No blood noted in vaginal canal or from cervical os. Some thick white mucous noted around cervix.  Neurological: She is alert and oriented to person, place, and time. She has normal reflexes.  Skin: Skin is warm and dry. No rash noted. She is not diaphoretic. No erythema. No  pallor.  Psychiatric: She has a normal mood and affect. Her behavior is normal. Judgment and thought content normal.  Patient seems to have propensity toward high risk behaviors (poor medical follow up and follow through).    MAU Course  Procedures Bimanual Pelvic Exam and Speculum Exam MDM Urinalysis + fht's 160 via doppler   Results for orders placed during the hospital encounter of 07/06/13 (from the past 24 hour(s))  URINALYSIS, ROUTINE W REFLEX MICROSCOPIC     Status: Abnormal   Collection Time    07/06/13 10:55 AM      Result Value Range   Color, Urine YELLOW  YELLOW   APPearance CLOUDY (*) CLEAR   Specific Gravity, Urine 1.020  1.005 - 1.030   pH 6.5  5.0 - 8.0   Glucose, UA NEGATIVE  NEGATIVE mg/dL   Hgb urine dipstick LARGE (*) NEGATIVE   Bilirubin Urine NEGATIVE  NEGATIVE   Ketones, ur NEGATIVE  NEGATIVE mg/dL   Protein, ur 30 (*) NEGATIVE mg/dL   Urobilinogen, UA 1.0  0.0 - 1.0 mg/dL   Nitrite NEGATIVE  NEGATIVE   Leukocytes, UA SMALL (*) NEGATIVE  URINE MICROSCOPIC-ADD ON     Status: Abnormal   Collection Time    07/06/13 10:55 AM      Result Value Range   Squamous Epithelial / LPF MANY (*) RARE   WBC, UA 0-2  <3 WBC/hpf   RBC / HPF 21-50  <3 RBC/hpf   Bacteria, UA FEW (*) RARE   Urine-Other MUCOUS PRESENT    WET PREP, GENITAL     Status: Abnormal   Collection Time    07/06/13 12:39 PM      Result Value Range   Yeast Wet Prep HPF POC NONE SEEN  NONE SEEN   Trich, Wet Prep NONE SEEN  NONE SEEN   Clue Cells Wet Prep HPF POC NONE SEEN  NONE SEEN   WBC, Wet Prep HPF POC FEW (*) NONE SEEN     Microscopic wet-mount exam shows few WBCs. GC and chlamydia DNA probe obtained and sent to lab. FHT confirmed by doppler at 162bpm.  Assessment and Plan  A: Unsupervised Intrauterine Pregnancy  Risk of Chlamydia  P: Discharge home Return to MAU as needed, if symptoms worsen Patient Education RE: STI and Prenatal Care Referral to [facility] Follow UA Culture  and GC / Chlamydia Culture- will call patient if results are positive   Jefm Petty 07/06/2013, 12:53 PM   Evaluation and management procedures were performed by the under my supervision and collaboration. I have reviewed the note and chart, and I agree with the management and plan.  Iona Hansen Kassem Kibbe, NP 07/06/2013 8:32 PM

## 2013-07-06 NOTE — MAU Provider Note (Signed)

## 2013-07-07 LAB — URINE CULTURE
Culture: NO GROWTH
Special Requests: NORMAL

## 2013-07-07 LAB — GC/CHLAMYDIA PROBE AMP: CT Probe RNA: NEGATIVE

## 2013-08-03 ENCOUNTER — Encounter: Payer: Self-pay | Admitting: Advanced Practice Midwife

## 2013-08-09 ENCOUNTER — Other Ambulatory Visit (HOSPITAL_COMMUNITY): Payer: Self-pay | Admitting: *Deleted

## 2013-08-09 DIAGNOSIS — Z3689 Encounter for other specified antenatal screening: Secondary | ICD-10-CM

## 2013-08-09 LAB — OB RESULTS CONSOLE HIV ANTIBODY (ROUTINE TESTING): HIV: NONREACTIVE

## 2013-08-09 LAB — OB RESULTS CONSOLE PLATELET COUNT: Platelets: 220 10*3/uL

## 2013-08-09 LAB — OB RESULTS CONSOLE RUBELLA ANTIBODY, IGM: Rubella: IMMUNE

## 2013-08-09 LAB — OB RESULTS CONSOLE ABO/RH: RH Type: POSITIVE

## 2013-08-09 LAB — OB RESULTS CONSOLE HGB/HCT, BLOOD
HCT: 26 %
Hemoglobin: 8.7 g/dL

## 2013-08-09 LAB — OB RESULTS CONSOLE ANTIBODY SCREEN: Antibody Screen: NEGATIVE

## 2013-08-09 LAB — OB RESULTS CONSOLE HEPATITIS B SURFACE ANTIGEN: Hepatitis B Surface Ag: NEGATIVE

## 2013-08-09 LAB — OB RESULTS CONSOLE GC/CHLAMYDIA
CHLAMYDIA, DNA PROBE: NEGATIVE
Gonorrhea: NEGATIVE

## 2013-08-09 LAB — SICKLE CELL SCREEN

## 2013-08-09 LAB — OB RESULTS CONSOLE VARICELLA ZOSTER ANTIBODY, IGG: Varicella: NON-IMMUNE/NOT IMMUNE

## 2013-08-09 LAB — OB RESULTS CONSOLE RPR: RPR: NONREACTIVE

## 2013-08-23 ENCOUNTER — Ambulatory Visit (HOSPITAL_COMMUNITY)
Admission: RE | Admit: 2013-08-23 | Discharge: 2013-08-23 | Disposition: A | Discharge status: Home / Self Care | Payer: Medicaid Other | Source: Ambulatory Visit | Attending: Obstetrics & Gynecology | Admitting: Obstetrics & Gynecology

## 2013-08-23 DIAGNOSIS — Z3689 Encounter for other specified antenatal screening: Secondary | ICD-10-CM

## 2013-08-26 NOTE — L&D Delivery Note (Signed)
Delivery Note At 10:38 AM a viable female was delivered via  (Presentation: OA).   Placenta status: delivered with cord traction via Tomasa BlaseSchultz.  3 vessel cord.  Anesthesia:  Epidural Episiotomy: None Lacerations: None Suture Repair: n/a Est. Blood Loss (mL): 200 ml  Mom to postpartum.  Baby to Couplet care / Skin to Skin.  Antionette CharLisa Jackson-Moore 01/12/2014, 11:07 AM

## 2013-09-29 ENCOUNTER — Encounter: Payer: Self-pay | Admitting: Obstetrics & Gynecology

## 2013-10-18 ENCOUNTER — Encounter: Payer: Self-pay | Admitting: Advanced Practice Midwife

## 2013-10-27 ENCOUNTER — Encounter: Payer: Self-pay | Admitting: Obstetrics & Gynecology

## 2013-10-27 ENCOUNTER — Ambulatory Visit (INDEPENDENT_AMBULATORY_CARE_PROVIDER_SITE_OTHER): Payer: Medicaid Other | Admitting: Obstetrics & Gynecology

## 2013-10-27 VITALS — BP 115/65 | Temp 97.2°F | Wt 180.0 lb

## 2013-10-27 DIAGNOSIS — Z3480 Encounter for supervision of other normal pregnancy, unspecified trimester: Secondary | ICD-10-CM

## 2013-10-27 NOTE — Progress Notes (Signed)
Pulse- 98 Patient states she is having lower abdomen pain and pressure.   Subjective:    [name] [name] is being seen today for her first obstetrical visit.  This is not a planned pregnancy. She is at 3689w1d gestation. Her obstetrical history is significant for none. Relationship with FOB: not in contact. Patient does not intend to breast feed. Pregnancy history fully reviewed.  Menstrual History: OB History   Grav Para Term Preterm Abortions TAB SAB Ect Mult Living   4 2 2  1  1   2       Menarche age: [age]  Patient's last menstrual period was 04/06/2013.    The following portions of the patient's history were reviewed and updated as appropriate: allergies, current medications, past family history, past medical history, past social history, past surgical history and problem list.  Review of Systems Pertinent items are noted in HPI.    Objective:   No exam today  Assessment:    Pregnancy at 5889w1d weeks --transferring care   Plan:     Prenatal vitamins.  Counseling provided regarding continued use of seat belts, cessation of alcohol consumption, smoking or use of illicit drugs; infection precautions i.e., influenza/TDAP immunizations, toxoplasmosis,CMV, parvovirus, listeria and varicella; workplace safety, exercise during pregnancy; routine dental care, safe medications, sexual activity, hot tubs, saunas, pools, travel, caffeine use, fish and methlymercury, potential toxins, hair treatments, varicose veins Weight gain recommendations per IOM guidelines reviewed: overweight/BMI 25 - 29.9--> gain 15 - 25 lbs Problem list reviewed and updated. Follow up in 2 weeks. 50% of 15 min visit spent on counseling and coordination of care.

## 2013-10-28 ENCOUNTER — Encounter: Payer: Self-pay | Admitting: Obstetrics & Gynecology

## 2013-10-28 ENCOUNTER — Encounter: Payer: Self-pay | Admitting: *Deleted

## 2013-10-28 DIAGNOSIS — Z3480 Encounter for supervision of other normal pregnancy, unspecified trimester: Secondary | ICD-10-CM

## 2013-10-31 NOTE — Patient Instructions (Signed)
Tetanus, Diphtheria (Td) Vaccine What You Need to Know WHY GET VACCINATED? Tetanus  and diphtheria are very serious diseases. They are rare in the [place] today, but people who do become infected often have severe complications. Td vaccine is used to protect adolescents and adults from both of these diseases. Both tetanus and diphtheria are infections caused by bacteria. Diphtheria spreads from person to person through coughing or sneezing. Tetanus-causing bacteria enter the body through cuts, scratches, or wounds. TETANUS (Lockjaw) causes painful muscle tightening and stiffness, usually all over the body.  It can lead to tightening of muscles in the head and neck so you can't open your mouth, swallow, or sometimes even breathe. Tetanus kills about 1 out of every 5 people who are infected. DIPHTHERIA can cause a thick coating to form in the back of the throat.  It can lead to breathing problems, paralysis, heart failure, and death. Before vaccines, the [place] saw as many as 200,000 cases a year of diphtheria and hundreds of cases of tetanus. Since vaccination began, cases of both diseases have dropped by about 99%. TD VACCINE Td vaccine can protect adolescents and adults from tetanus and diphtheria. Td is usually given as a booster dose every 10 years but it can also be given earlier after a severe and dirty wound or burn. Your doctor can give you more information. Td may safely be given at the same time as other vaccines. SOME PEOPLE SHOULD NOT GET THIS VACCINE  If you ever had a life-threatening allergic reaction after a dose of any tetanus or diphtheria containing vaccine, OR if you have a severe allergy to any part of this vaccine, you should not get Td. Tell your doctor if you have any severe allergies.  Talk to your doctor if you:  have epilepsy or another nervous system problem,  had severe pain or swelling after any vaccine containing diphtheria or tetanus,  ever had  Guillain Barr Syndrome (GBS),  aren't feeling well on the day the shot is scheduled. RISKS OF A VACCINE REACTION With a vaccine, like any medicine, there is a chance of side effects. These are usually mild and go away on their own. Serious side effects are also possible, but are very rare. Most people who get Td vaccine do not have any problems with it. Mild Problems  following Td (Did not interfere with activities)  Pain where the shot was given (about 8 people in 10)  Redness or swelling where the shot was given (about 1 person in 3)  Mild fever (about 1 person in 15)  Headache or Tiredness (uncommon) Moderate Problems following Td (Interfered with activities, but did not require medical attention)  Fever over 102 F (38.9 C) (rare) Severe Problems  following Td (Unable to perform usual activities; required medical attention)  Swelling, severe pain, bleeding, or redness in the arm where the shot was given (rare). Problems that could happen after any vaccine:  Brief fainting spells can happen after any medical procedure, including vaccination. Sitting or lying down for about 15 minutes can help prevent fainting, and injuries caused by a fall. Tell your doctor if you feel dizzy, or have vision changes or ringing in the ears.  Severe shoulder pain and reduced range of motion in the arm where a shot was given can happen, very rarely, after a vaccination.  Severe allergic reactions from a vaccine are very rare, estimated at less than 1 in a million doses. If one were to occur, it would   usually be within a few minutes to a few hours after the vaccination. WHAT IF THERE IS A SERIOUS REACTION? What should I look for?  Look for anything that concerns you, such as signs of a severe allergic reaction, very high fever, or behavior changes. Signs of a severe allergic reaction can include hives, swelling of the face and throat, difficulty breathing, a fast heartbeat, dizziness, and  weakness. These would usually start a few minutes to a few hours after the vaccination. What should I do?  If you think it is a severe allergic reaction or other emergency that can't wait, call 911 or get the person to the nearest hospital. Otherwise, call your doctor.  Afterward, the reaction should be reported to the Vaccine Adverse Event Reporting System (VAERS). Your doctor might file this report, or, you can do it yourself through the VAERS website or by calling 1-800-822-7967. VAERS is only for reporting reactions. They do not give medical advice. THE NATIONAL VACCINE INJURY COMPENSATION PROGRAM The National Vaccine Injury Compensation Program (VICP) is a federal program that was created to compensate people who may have been injured by certain vaccines. Persons who believe they may have been injured by a vaccine can learn about the program and about filing a claim by calling 1-800-338-2382 or visiting the VICP website. HOW CAN I LEARN MORE?  Ask your doctor.  Contact your local or state health department.  Contact the Centers for Disease Control and Prevention (CDC):  Call 1-800-232-4636 (1-800-CDC-INFO)  Visit CDC's vaccines website CDC Td Vaccine Interim VIS (09/29/12) Document Released: 06/09/2006 Document Revised: 12/07/2012 Document Reviewed: 12/02/2012 ExitCare Patient Information 2014 [employer].  

## 2013-11-02 ENCOUNTER — Encounter: Payer: Self-pay | Admitting: Advanced Practice Midwife

## 2013-11-10 ENCOUNTER — Ambulatory Visit (INDEPENDENT_AMBULATORY_CARE_PROVIDER_SITE_OTHER): Payer: Medicaid Other | Admitting: Obstetrics

## 2013-11-10 ENCOUNTER — Other Ambulatory Visit: Payer: Medicaid Other

## 2013-11-10 VITALS — BP 121/71 | Wt 181.0 lb

## 2013-11-10 DIAGNOSIS — Z3480 Encounter for supervision of other normal pregnancy, unspecified trimester: Secondary | ICD-10-CM

## 2013-11-10 LAB — CBC
HCT: 32.6 % — ABNORMAL LOW (ref 36.0–46.0)
Hemoglobin: 11 g/dL — ABNORMAL LOW (ref 12.0–15.0)
MCH: 28.7 pg (ref 26.0–34.0)
MCHC: 33.7 g/dL (ref 30.0–36.0)
MCV: 85.1 fL (ref 78.0–100.0)
Platelets: 207 10*3/uL (ref 150–400)
RBC: 3.83 MIL/uL — AB (ref 3.87–5.11)
RDW: 18.2 % — ABNORMAL HIGH (ref 11.5–15.5)
WBC: 8.9 10*3/uL (ref 4.0–10.5)

## 2013-11-10 NOTE — Progress Notes (Signed)
Pulse 90 Pt would like to have STD cultures at today's visit.

## 2013-11-10 NOTE — Progress Notes (Unsigned)
Patient in office today for 2 HR GTT. 2 HR GTT not drawn per Dr. Clearance CootsHarper because it took the patient 35 minutes to finish the drink. Only doing fasting glucose.

## 2013-11-11 ENCOUNTER — Other Ambulatory Visit: Payer: Medicaid Other

## 2013-11-11 ENCOUNTER — Encounter: Payer: Medicaid Other | Admitting: Advanced Practice Midwife

## 2013-11-11 ENCOUNTER — Encounter: Payer: Self-pay | Admitting: Obstetrics

## 2013-11-11 LAB — HIV ANTIBODY (ROUTINE TESTING W REFLEX): HIV: NONREACTIVE

## 2013-11-11 LAB — GLUCOSE, RANDOM: GLUCOSE: 57 mg/dL — AB (ref 70–99)

## 2013-11-11 LAB — RPR

## 2013-12-01 ENCOUNTER — Ambulatory Visit (INDEPENDENT_AMBULATORY_CARE_PROVIDER_SITE_OTHER): Payer: Medicaid Other | Admitting: Obstetrics

## 2013-12-01 ENCOUNTER — Encounter: Payer: Medicaid Other | Admitting: Obstetrics

## 2013-12-01 VITALS — BP 129/81 | Temp 97.3°F | Wt 189.0 lb

## 2013-12-01 DIAGNOSIS — Z3480 Encounter for supervision of other normal pregnancy, unspecified trimester: Secondary | ICD-10-CM

## 2013-12-01 LAB — POCT URINALYSIS DIPSTICK
Bilirubin, UA: NEGATIVE
Glucose, UA: NEGATIVE
Ketones, UA: NEGATIVE
NITRITE UA: NEGATIVE
Spec Grav, UA: 1.015
UROBILINOGEN UA: NEGATIVE
pH, UA: 6.5

## 2013-12-01 NOTE — Progress Notes (Signed)
Pulse: 91 Patient states she is having lower abdominal and pelvic pain and pressure. Patient states for the past two days it has been more pain then pressure.  Temperature would not read orally so temperature was taken axillary. Patient states she is taking an iron tablet.

## 2013-12-02 ENCOUNTER — Emergency Department (HOSPITAL_COMMUNITY): Payer: Medicaid Other

## 2013-12-02 ENCOUNTER — Emergency Department (HOSPITAL_COMMUNITY)
Admission: EM | Admit: 2013-12-02 | Discharge: 2013-12-02 | Discharge status: Against Medical Advice | Payer: Medicaid Other | Attending: Emergency Medicine | Admitting: Emergency Medicine

## 2013-12-02 ENCOUNTER — Encounter (HOSPITAL_COMMUNITY): Payer: Self-pay | Admitting: Emergency Medicine

## 2013-12-02 DIAGNOSIS — O9989 Other specified diseases and conditions complicating pregnancy, childbirth and the puerperium: Secondary | ICD-10-CM | POA: Insufficient documentation

## 2013-12-02 DIAGNOSIS — R05 Cough: Secondary | ICD-10-CM | POA: Insufficient documentation

## 2013-12-02 DIAGNOSIS — R079 Chest pain, unspecified: Secondary | ICD-10-CM

## 2013-12-02 DIAGNOSIS — R059 Cough, unspecified: Secondary | ICD-10-CM | POA: Insufficient documentation

## 2013-12-02 DIAGNOSIS — Z79899 Other long term (current) drug therapy: Secondary | ICD-10-CM | POA: Insufficient documentation

## 2013-12-02 DIAGNOSIS — Z8619 Personal history of other infectious and parasitic diseases: Secondary | ICD-10-CM | POA: Insufficient documentation

## 2013-12-02 DIAGNOSIS — R0789 Other chest pain: Secondary | ICD-10-CM | POA: Insufficient documentation

## 2013-12-02 DIAGNOSIS — Z872 Personal history of diseases of the skin and subcutaneous tissue: Secondary | ICD-10-CM | POA: Insufficient documentation

## 2013-12-02 DIAGNOSIS — R0602 Shortness of breath: Secondary | ICD-10-CM | POA: Insufficient documentation

## 2013-12-02 LAB — CBC
HCT: 32.6 % — ABNORMAL LOW (ref 36.0–46.0)
Hemoglobin: 10.9 g/dL — ABNORMAL LOW (ref 12.0–15.0)
MCH: 28.7 pg (ref 26.0–34.0)
MCHC: 33.4 g/dL (ref 30.0–36.0)
MCV: 85.8 fL (ref 78.0–100.0)
PLATELETS: 181 10*3/uL (ref 150–400)
RBC: 3.8 MIL/uL — ABNORMAL LOW (ref 3.87–5.11)
RDW: 16.2 % — AB (ref 11.5–15.5)
WBC: 9.4 10*3/uL (ref 4.0–10.5)

## 2013-12-02 LAB — BASIC METABOLIC PANEL
BUN: 5 mg/dL — ABNORMAL LOW (ref 6–23)
CO2: 20 mEq/L (ref 19–32)
Calcium: 8.6 mg/dL (ref 8.4–10.5)
Chloride: 105 mEq/L (ref 96–112)
Creatinine, Ser: 0.41 mg/dL — ABNORMAL LOW (ref 0.50–1.10)
GFR calc non Af Amer: >90 mL/min (ref >90)
Glucose, Bld: 78 mg/dL (ref 70–99)
POTASSIUM: 3.9 meq/L (ref 3.7–5.3)
Sodium: 140 mEq/L (ref 137–147)

## 2013-12-02 LAB — I-STAT TROPONIN, ED: Troponin i, poc: 0 ng/mL (ref 0.00–0.08)

## 2013-12-02 LAB — PRO B NATRIURETIC PEPTIDE: Pro B Natriuretic peptide (BNP): <5 pg/mL (ref 0–125)

## 2013-12-02 LAB — POC URINE PREG, ED: Preg Test, Ur: POSITIVE — AB

## 2013-12-02 NOTE — ED Notes (Signed)
Pt states [redacted] weeks pregnant. With 2 living children and 1 miscarriage.

## 2013-12-02 NOTE — ED Notes (Signed)
Pt states SOB with clear sputum

## 2013-12-02 NOTE — ED Notes (Signed)
PA at bedside.

## 2013-12-02 NOTE — Discharge Instructions (Signed)
You have decided not to go forth with CT scan of your chest at this time to rule out a blood clot. I recommend that you monitor symptoms closely and follow-up with physician in the next 24-48 hours for re-check. [facility] new or worsening symptoms.

## 2013-12-02 NOTE — ED Notes (Signed)
Pt states CP x 3 days which started while she was walking.

## 2013-12-02 NOTE — ED Notes (Addendum)
While ambulating, Patient 02 dropped from 95 to 74.  Patient pulse started out as 93 went up to 126. Brought Patient back to room in wheelchair.

## 2013-12-02 NOTE — ED Provider Notes (Signed)
CSN: [id number]     Arrival date & time 12/02/13  0445 History   First MD Initiated Contact with Patient 12/02/13 [phone]     Chief Complaint  Patient presents with  . Chest Pain     (Consider location/radiation/quality/duration/timing/severity/associated sxs/prior Treatment) Patient is a 23 y.o. female presenting with chest pain. The history is provided by the patient and medical records.  Chest Pain Associated symptoms: shortness of breath    This is a 23 y.o. F, G1P0 currently [redacted] weeks gestation presenting to the ED for chest pain and shortness of breath over the past 3 days.  Pain described as a tightness across her upper chest.  States sx are worse with exertional activities, especially if walking long distances.  Denies associated palpitations, diaphoresis, dizziness, weakness, nausea, or vomiting.  She has had a productive cough with clear sputum over the past several days.  Does have hx of allergies and has had occasional sore throat and watery eyes recently.  No recent travel, surgeries, LE edema, calf pain, or prolonged immobilization.  No prior hx of DVT or PE.  PMH significant for anemia, currently on Fe+ supplements.  Denies any abdominal pain, vaginal bleeding, loss of fluids, or feelings of contractions.    Past Medical History  Diagnosis Date  . Eczema   . Infection     chlamydia  . Trichomonas   . Chlamydia    Past Surgical History  Procedure Laterality Date  . Wisdom tooth extraction    . Fractured finger as a child     Family History  Problem Relation Age of Onset  . Other Neg Hx   . Asthma Sister   . Heart disease Maternal Grandmother    History  Substance Use Topics  . Smoking status: Never Smoker   . Smokeless tobacco: Never Used  . Alcohol Use: No   OB History   Grav Para Term Preterm Abortions TAB SAB Ect Mult Living   4 2 2  1  1   2      Review of Systems  Respiratory: Positive for shortness of breath.   Cardiovascular: Positive for chest pain.   All other systems reviewed and are negative.     Allergies  Review of patient's allergies indicates no known allergies.  Home Medications   Current Outpatient Rx  Name  Route  Sig  Dispense  Refill  . ferrous sulfate 325 (65 FE) MG tablet   Oral   Take 325 mg by mouth 3 (three) times daily with meals.          BP 120/59  Pulse 94  Temp(Src) 98.3 F (36.8 C) (Oral)  Resp 19  SpO2 97%  LMP 04/06/2013  Physical Exam  Nursing note and vitals reviewed. Constitutional: She is oriented to person, place, and time. She appears well-developed and well-nourished. No distress.  Lying comfortably in bed, NAD  HENT:  Head: Normocephalic and atraumatic.  Mouth/Throat: Oropharynx is clear and moist.  Eyes: Conjunctivae and EOM are normal. Pupils are equal, round, and reactive to light.  Neck: Normal range of motion.  Cardiovascular: Normal rate, regular rhythm and normal heart sounds.   Pulmonary/Chest: Effort normal and breath sounds normal. No accessory muscle usage. Not tachypneic. No respiratory distress. She has no decreased breath sounds. She has no wheezes. She has no rhonchi. She has no rales.  Normal work of breathing without accessory muscle use; no audible wheezes, rhonchi, or rales; pt speaking in full complete sentences without difficulty  Abdominal:  pregnant  Musculoskeletal: Normal range of motion.  No calf asymmetry, tenderness, or palpable cords; negative Homan's sign bilaterally  Neurological: She is alert and oriented to person, place, and time.  Skin: Skin is warm and dry. She is not diaphoretic.  Psychiatric: She has a normal mood and affect.    ED Course  Procedures (including critical care time)   Date: 12/02/2013  Rate: 103  Rhythm: sinus tachycardia  QRS Axis: normal  Intervals: normal  ST/T Wave abnormalities: normal  Conduction Disutrbances:none  Narrative Interpretation:   Old EKG Reviewed: none available   Labs Review Labs Reviewed  CBC  - Abnormal; Notable for the following:    RBC 3.80 (*)    Hemoglobin 10.9 (*)    HCT 32.6 (*)    RDW 16.2 (*)    All other components within normal limits  BASIC METABOLIC PANEL - Abnormal; Notable for the following:    BUN 5 (*)    Creatinine, Ser 0.41 (*)    All other components within normal limits  POC URINE PREG, ED - Abnormal; Notable for the following:    Preg Test, Ur POSITIVE (*)    All other components within normal limits  PRO B NATRIURETIC PEPTIDE  I-STAT TROPOININ, ED   Imaging Review No results found.   EKG Interpretation None      MDM   Final diagnoses:  Chest pain  Shortness of breath   On initial evaluation pt is lying comfortably in bed in NAD.  She has no signs of respiratory distress and is saturating well on RA.  She is able to speak in full complete sentences without dyspnea.  She is [redacted] weeks gestation has no other RF or prior hx of PE.  No LE symptoms to suggest DVT.  EKG sinus tach, rate 103 without ischemic changes.  Labs pending at this time.  Labs reassuring.  CXR is clear.  Pts HR has slowed without intervention now, 83.  She continues to saturate well on RA.  Pt was ambulated with pulse ox-- first time she desatted down to 74%, less than 5 minutes later was ambulated again with O2 sats 88-100%.  Pt states she just felt as though she needed to take several deep breaths but she did not experience any lightheadedness or chest pain while ambulating. Possible error with pulse-ox, however this does raise concern for possible PE.  Long discussion with pt regarding possibility of PE and process for evaluation for such including CT angio of chest.  Pt was advised that this is radiation to her and baby and does not go without risk.  She has elected not to go forth with scan at this time.  I have encouraged her to monitor sx closely and to FU within the next 24-48 hours for re-check.  She was instructed to return to the ED immediately for new or worsening  symptoms.  Pt advised of risks of not going forth with scan including worsening condition, unknown PE, cardiac instability or even death.  Pt acknowledged these risks and will sign out AMA.  She has agreed to have OP follow-up.  Garlon Hatchet, PA-C 12/02/13 [phone]

## 2013-12-02 NOTE — ED Notes (Addendum)
Sats 88-100% on RA while ambulating approx 53800ft. Pt states she felt like she couldn't get a deep breath and coughed several times. Sharilyn SitesLisa Sanders PA-C aware. Pt assisted back to bed and placed on cardiac monitor.

## 2013-12-02 NOTE — Progress Notes (Signed)
Patient left before being seen by physician.

## 2013-12-03 NOTE — ED Provider Notes (Signed)
Medical screening examination/treatment/procedure(s) were performed by non-physician practitioner and as supervising physician I was immediately available for consultation/collaboration.   EKG Interpretation None       Sunnie NielsenBrian Mark Benecke, MD 12/03/13 [phone]

## 2013-12-08 ENCOUNTER — Ambulatory Visit (INDEPENDENT_AMBULATORY_CARE_PROVIDER_SITE_OTHER): Payer: Medicaid Other | Admitting: Obstetrics & Gynecology

## 2013-12-08 VITALS — BP 117/66 | Temp 97.5°F | Wt 188.0 lb

## 2013-12-08 DIAGNOSIS — Z3480 Encounter for supervision of other normal pregnancy, unspecified trimester: Secondary | ICD-10-CM

## 2013-12-08 DIAGNOSIS — O321XX0 Maternal care for breech presentation, not applicable or unspecified: Secondary | ICD-10-CM | POA: Insufficient documentation

## 2013-12-08 LAB — POCT URINALYSIS DIPSTICK
Bilirubin, UA: NEGATIVE
Blood, UA: NEGATIVE
Glucose, UA: NEGATIVE
Ketones, UA: NEGATIVE
Nitrite, UA: NEGATIVE
Protein, UA: NEGATIVE
Spec Grav, UA: 1.015
Urobilinogen, UA: NEGATIVE
pH, UA: 5

## 2013-12-08 NOTE — Patient Instructions (Signed)
Medroxyprogesterone injection [Contraceptive] What is this medicine? MEDROXYPROGESTERONE (me DROX ee proe JES te rone) contraceptive injections prevent pregnancy. They provide effective birth control for 3 months. Depo-subQ Provera 104 is also used for treating pain related to endometriosis. This medicine may be used for other purposes; ask your health care provider or pharmacist if you have questions. COMMON BRAND NAME(S): Depo-Provera, Depo-subQ Provera 104 What should I tell my health care provider before I take this medicine? They need to know if you have any of these conditions: -frequently drink alcohol -asthma -blood vessel disease or a history of a blood clot in the lungs or legs -bone disease such as osteoporosis -breast cancer -diabetes -eating disorder (anorexia nervosa or bulimia) -high blood pressure -HIV infection or AIDS -kidney disease -liver disease -mental depression -migraine -seizures (convulsions) -stroke -tobacco smoker -vaginal bleeding -an unusual or allergic reaction to medroxyprogesterone, other hormones, medicines, foods, dyes, or preservatives -pregnant or trying to get pregnant -breast-feeding How should I use this medicine? Depo-Provera Contraceptive injection is given into a muscle. Depo-subQ Provera 104 injection is given under the skin. These injections are given by a health care professional. You must not be pregnant before getting an injection. The injection is usually given during the first 5 days after the start of a menstrual period or 6 weeks after delivery of a baby. Talk to your pediatrician regarding the use of this medicine in children. Special care may be needed. These injections have been used in female children who have started having menstrual periods. Overdosage: If you think you have taken too much of this medicine contact a poison control center or emergency room at once. NOTE: This medicine is only for you. Do not share this medicine  with others. What if I miss a dose? Try not to miss a dose. You must get an injection once every 3 months to maintain birth control. If you cannot keep an appointment, call and reschedule it. If you wait longer than 13 weeks between Depo-Provera contraceptive injections or longer than 14 weeks between Depo-subQ Provera 104 injections, you could get pregnant. Use another method for birth control if you miss your appointment. You may also need a pregnancy test before receiving another injection. What may interact with this medicine? Do not take this medicine with any of the following medications: -bosentan This medicine may also interact with the following medications: -aminoglutethimide -antibiotics or medicines for infections, especially rifampin, rifabutin, rifapentine, and griseofulvin -aprepitant -barbiturate medicines such as phenobarbital or primidone -bexarotene -carbamazepine -medicines for seizures like ethotoin, felbamate, oxcarbazepine, phenytoin, topiramate -modafinil -St. John's wort This list may not describe all possible interactions. Give your health care provider a list of all the medicines, herbs, non-prescription drugs, or dietary supplements you use. Also tell them if you smoke, drink alcohol, or use illegal drugs. Some items may interact with your medicine. What should I watch for while using this medicine? This drug does not protect you against HIV infection (AIDS) or other sexually transmitted diseases. Use of this product may cause you to lose calcium from your bones. Loss of calcium may cause weak bones (osteoporosis). Only use this product for more than 2 years if other forms of birth control are not right for you. The longer you use this product for birth control the more likely you will be at risk for weak bones. Ask your health care professional how you can keep strong bones. You may have a change in bleeding pattern or irregular periods. Many females stop having    periods while taking this drug. If you have received your injections on time, your chance of being pregnant is very low. If you think you may be pregnant, see your health care professional as soon as possible. Tell your health care professional if you want to get pregnant within the next year. The effect of this medicine may last a long time after you get your last injection. What side effects may I notice from receiving this medicine? Side effects that you should report to your doctor or health care professional as soon as possible: -allergic reactions like skin rash, itching or hives, swelling of the face, lips, or tongue -breast tenderness or discharge -breathing problems -changes in vision -depression -feeling faint or lightheaded, falls -fever -pain in the abdomen, chest, groin, or leg -problems with balance, talking, walking -unusually weak or tired -yellowing of the eyes or skin Side effects that usually do not require medical attention (report to your doctor or health care professional if they continue or are bothersome): -acne -fluid retention and swelling -headache -irregular periods, spotting, or absent periods -temporary pain, itching, or skin reaction at site where injected -weight gain This list may not describe all possible side effects. Call your doctor for medical advice about side effects. You may report side effects to FDA at 1-800-FDA-1088. Where should I keep my medicine? This does not apply. The injection will be given to you by a health care professional. NOTE: This sheet is a summary. It may not cover all possible information. If you have questions about this medicine, talk to your doctor, pharmacist, or health care provider.  2014, Elsevier/Gold Standard. (2008-09-02 18:37:56) Breech Delivery A breech birth is when a baby is born with the buttocks or the feet first. Most babies are in a head down (vertex) position when they are born. Many babies are breech early  in the pregnancy but turn to headfirst position toward the end of the pregnancy. There are three types of breech babies that can pose a risk to the baby and may need delivery by cesarean delivery. These include:  When the baby's buttocks is showing first in the birth canal (vagina) with the legs straight up and the feet at the baby's head (frank breech).  When the baby's buttocks shows first with the legs bent at the knees and the feet down near the buttocks (complete breech).  When one or both of the baby's feet are down below the buttocks (footling breech). RISKS WITH BREECH DELIVERIES:  Umbilical cord prolapse. This is when the umbilical cord is in front of the baby before or during labor.  Injury to the nerves in the shoulder, arm and hand (Brachial Plexus injury) when delivered.  Birth defects, overly large head (hydrocephalic) and neural tube defects (spina bifida) are seen more often.  Premature babies are seen more often. These are babies that are born too early.  There is an increase rate for cesarean delivery with breech babies. CAUSE OF BREECH PREGNANCY:  The mother has had several babies.  Having twins or more.  The uterus is abnormal in shape and size (double uterus).  The baby weighs less than 5 pounds.  There is too much or not enough amniotic fluid.  There is a tumor in the uterus.  All or part of the placenta covers the opening of the uterus (placenta previa). DIAGNOSIS  When you are close to your due date, your caregiver can tell if your baby is breech by an abdominal or vaginal exam, an X-ray  or ultrasound, or any combination both. TREATMENT   More problems are likely if the baby is breech, but some babies may be delivered safely without a cesarean delivery. A cesarean delivery also has risks such as longer hospital stays, blood clots, infection or bleeding. A breech delivery can compress the umbilical cord or cut off the blood supply to the baby. This can  cause brain damage or death to the baby.  You and your caregiver will discuss the best way to deliver the baby, in your particular circumstance.  Your caregiver may try to turn the baby in your uterus. This is done towards the end of a normal pregnancy with your caregiver placing both hands on the abdomen, gently and slowly turning the baby around. This is a procedure called external cephalic version (ECV). If this procedure is successful, and the baby stays head down, a normal delivery is more likely.  Some doctors will plan to deliver a breech baby by cesarean delivery. An ECV should be attempted only when:  The pregnancy is more than [redacted] weeks along.  The breech should be confirmed with an ultrasound.  ECV should only be done in a delivery/surgical room with an anesthesiologist, surgical nurses and pediatric nursery nurses and preferably a pediatrician present. All should be ready for an emergency cesarean delivery if necessary.  A medication is given before the ECV is attempted to relax the muscles of the uterus.  A nonstress test on the baby should be normal.  A biophysical profile on the baby should be normal.  The baby should have continuous monitoring during the ECV.  Using regional anesthesia (epidural) helps the outcome of the ECV to be more successful.  The baby should be monitored for 1 to 2 hours after the procedure. The benefits of ECV are:   Lower risk to the baby when delivered head first.  Lower cost.  Decrease in rate of cesarean delivery. The ECV should not be used if you have:  Vaginal bleeding.  Placenta previa.  A nonreactive nonstress test of the baby.  An abnormal biophysical profile of the baby.  An abnormally small baby.  A low level of fluid in the sac that surrounds and protects the baby.  An abnormal fetal heart rate.  Early rupture of the membranes.  Twins or other multiple pregnancy.  An abnormal shape uterus, tumor or genetic defect  (double uterus). More than half of external cephalic versions work. Even when they work, there is always a chance that the baby will turn back to the breech position.  HOME CARE INSTRUCTIONS  When you are pregnant:  See your caregiver regularly.  Take your prenatal vitamins as suggested.  Eat a well-balanced diet and exercise regularly unless instructed otherwise.  Get plenty of rest and sleep. After you have the baby:  You may have a small amount of bleeding for a week or so.  You may have some cramping of the uterus, especially if you are nursing.  Do not have sexual intercourse until your caregiver says it is okay.  Do not use tampons or douche.  Do not take aspirin because it can cause bleeding.  If you had a cesarean delivery, you may have some pain in the cut (incision). Your caregiver will advise you or give you pain medication.  Do not lift anything over 5 pounds.  Try to have help at home for 2 to 3 weeks.  Keep all your post delivery appointments. SEEK IMMEDIATE MEDICAL CARE IF:  Before  you have the baby:  You have vaginal bleeding.  You are leaking fluid or have a gush of fluid from the vagina.  You develop uterine contractions.  You develop a temperature of 100 F (37.8 C) or higher.  You feel the baby is not moving or is moving less than normal. After you have the baby:  You develop a temperature of 100 F (37.8 C) or higher.  You have heavy vaginal bleeding.  You develop abnormal vaginal discharge.  You have pain when you urinate.  You become light headed or faint.  You have leg pain, chest pain or shortness of breath.  If you had a cesarean delivery and develop redness and swelling around the incision. You see fluid (pus) coming from the incision. Call your caregiver if you think you are developing any problems during the pregnancy or after you have the baby. Document Released: 10/03/2006 Document Revised: 11/04/2011 Document Reviewed:  03/30/2008 [facility] Patient Information 2014 [employer] [place].

## 2013-12-08 NOTE — Progress Notes (Signed)
Pulse- 85 Pt states she is having lower abdomen pain and pressure. Breech presentation by Leopold's maneuvers--confirmed by informal U/S.  Counseled re: ECV/possible C/D

## 2013-12-09 LAB — GC/CHLAMYDIA PROBE AMP
CT Probe RNA: NEGATIVE
GC Probe RNA: NEGATIVE

## 2013-12-10 LAB — STREP B DNA PROBE: STREP GROUP B AG: NEGATIVE

## 2013-12-15 ENCOUNTER — Ambulatory Visit (INDEPENDENT_AMBULATORY_CARE_PROVIDER_SITE_OTHER): Payer: Medicaid Other | Admitting: Obstetrics & Gynecology

## 2013-12-15 VITALS — BP 119/69 | HR 93 | Temp 96.3°F | Wt 189.0 lb

## 2013-12-15 DIAGNOSIS — Z3480 Encounter for supervision of other normal pregnancy, unspecified trimester: Secondary | ICD-10-CM

## 2013-12-15 LAB — POCT URINALYSIS DIPSTICK
Bilirubin, UA: NEGATIVE
Blood, UA: NEGATIVE
Glucose, UA: NEGATIVE
Ketones, UA: NEGATIVE
Nitrite, UA: NEGATIVE
SPEC GRAV UA: 1.01
Urobilinogen, UA: NEGATIVE
pH, UA: 7

## 2013-12-15 NOTE — Progress Notes (Signed)
Subjective:    [name] [name] is a 23 y.o. female being seen today for her obstetrical visit. She is at 8762w1d gestation. Patient reports no complaints. Fetal movement: normal.   Past Medical History  Diagnosis Date  . Eczema   . Infection     chlamydia  . Trichomonas   . Chlamydia     Past Surgical History  Procedure Laterality Date  . Wisdom tooth extraction    . Fractured finger as a child      Current outpatient prescriptions:ferrous sulfate 325 (65 FE) MG tablet, Take 325 mg by mouth 3 (three) times daily with meals., Disp: , Rfl:  No Known Allergies  History  Substance Use Topics  . Smoking status: Never Smoker   . Smokeless tobacco: Never Used  . Alcohol Use: No    Family History  Problem Relation Age of Onset  . Other Neg Hx   . Asthma Sister   . Heart disease Maternal Grandmother      Review of Systems Constitutional: negative for anorexia Gastrointestinal: negative for abdominal pain Genitourinary:negative for vaginal discharge Musculoskeletal:negative for back pain Behavioral/Psych: negative for depression and tobacco use   Objective:    BP 119/69  Pulse 93  Temp(Src) 96.3 F (35.7 C)  Wt 85.73 kg (189 lb)  LMP 04/06/2013 FHT:   120 BPM  Uterine Size: size equals dates  Presentation: cephalic confirmed by informal U/S     Assessment:    Pregnancy @ 2562w1d weeks   Plan:     labs reviewed, problem list updated Cigarette smoking: never smoked. Orders Placed This Encounter  Procedures  . POCT urinalysis dipstick    Follow up in 1 Week.

## 2013-12-16 ENCOUNTER — Encounter: Payer: Medicaid Other | Admitting: Obstetrics & Gynecology

## 2013-12-22 ENCOUNTER — Ambulatory Visit (INDEPENDENT_AMBULATORY_CARE_PROVIDER_SITE_OTHER): Payer: Medicaid Other | Admitting: Obstetrics & Gynecology

## 2013-12-22 ENCOUNTER — Encounter: Payer: Self-pay | Admitting: Obstetrics & Gynecology

## 2013-12-22 VITALS — BP 123/72 | HR 84 | Temp 97.5°F | Wt 190.0 lb

## 2013-12-22 DIAGNOSIS — Z3480 Encounter for supervision of other normal pregnancy, unspecified trimester: Secondary | ICD-10-CM

## 2013-12-22 LAB — POCT URINALYSIS DIPSTICK
Bilirubin, UA: NEGATIVE
Blood, UA: NEGATIVE
GLUCOSE UA: NEGATIVE
Leukocytes, UA: NEGATIVE
NITRITE UA: NEGATIVE
SPEC GRAV UA: 1.015
UROBILINOGEN UA: NEGATIVE
pH, UA: 6.5

## 2013-12-22 NOTE — Progress Notes (Signed)
Subjective:    [name] [name] is a 23 y.o. female being seen today for her obstetrical visit. She is at 4118w1d  gestation. Patient reports no complaints. Fetal movement: normal.   Past Medical History  Diagnosis Date  . Eczema   . Infection     chlamydia  . Trichomonas   . Chlamydia     Past Surgical History  Procedure Laterality Date  . Wisdom tooth extraction    . Fractured finger as a child      Current outpatient prescriptions:ferrous sulfate 325 (65 FE) MG tablet, Take 325 mg by mouth 3 (three) times daily with meals., Disp: , Rfl:  No Known Allergies  History  Substance Use Topics  . Smoking status: Never Smoker   . Smokeless tobacco: Never Used  . Alcohol Use: No    Family History  Problem Relation Age of Onset  . Other Neg Hx   . Asthma Sister   . Heart disease Maternal Grandmother      Review of Systems Constitutional: negative for anorexia Gastrointestinal: negative for abdominal pain Genitourinary:negative for vaginal discharge Musculoskeletal:negative for back pain Behavioral/Psych: negative for depression and tobacco use   Objective:    BP 123/72  Pulse 84  Temp(Src) 97.5 F (36.4 C)  Wt 86.183 kg (190 lb)  LMP 04/06/2013 FHT:   120 BPM  Uterine Size: size equals dates  Presentation: cephalic      Assessment:    Pregnancy @ 6218w1d  weeks   Plan:     labs reviewed, problem list updated Cigarette smoking: never smoked. Orders Placed This Encounter  Procedures  . POCT urinalysis dipstick    Follow up in 1 Week.

## 2013-12-22 NOTE — Patient Instructions (Signed)
Sudden Infant Death Syndrome (SIDS): Sleeping Position SIDS is the sudden death of a healthy infant. The cause of SIDS is not known. However, there are certain factors that put the baby at risk, such as:  Babies placed on their stomach or side to sleep.  The baby being born earlier than normal (prematurity).  Being of [place] American, Native [place], and [place] Native descent.  Being a female. SIDS is seen more often in female babies than in female babies.  Sleeping on a soft surface.  Overheating.  Having a mother who smokes or uses illegal drugs.  Being an infant of a mother who is very young.  Having poor prenatal care.  Babies that had a low weight at birth.  Abnormalities of the placenta, the organ that provides nutrition in the womb.  Babies born in the fall or winter months.  Recent respiratory tract infection. Although it is recommended that most babies should be put on their backs to sleep, some questions have arisen: IS THE SIDE POSITION AS EFFECTIVE AS THE BACK? The side position is not recommended because there is still an increased chance of SIDS compared to the back position. Your baby should be placed on his or her back every time he or she sleeps. ARE THERE ANY BABIES WHO SHOULD BE PLACED ON THEIR TUMMY FOR SLEEP? Babies with certain disorders have fewer problems when lying on their tummy. These babies include:  Infants with symptomatic gastro-esophageal reflux (GERD). Reflux is usually less in the tummy position.  Babies with certain upper airway malformations, such as Robin syndrome. There are fewer occurrences of the airway being blocked when lying on the stomach. Before letting your baby sleep on his/her tummy, discuss with your caregiver. If your baby has one of the above problems, your caregiver will help you decide if the benefits of tummy sleeping are more than the small increased risk for SIDS. Be sure to avoid overheating and soft bedding as these risk  factors are troublesome for belly sleeping infants. SHOULD HEALTHY BABIES EVER BE PLACED ON THE TUMMY? Having tummy time while the baby is awake is important for movement (motor) development. It can also lower the chance of a flattened head (positional plagiocephaly). Flattened head can be the result of spending too much time on their back. Tummy time when the baby is awake and watched by an adult is good for baby's development. WHICH SLEEPING POSITION IS BEST FOR A BABY BORN EARLY (PRE-TERM) AFTER LEAVING THE HOSPITAL? In the nursery, babies who are born early (pre-term) often receive care in a position lying on their backs. Once recovered and ready to leave the hospital, there is no reason to believe that they should be treated any differently than a baby who was born at term. Unless there are specific instructions to do otherwise, these babies should be placed on their backs to sleep. IN WHAT POSITION ARE FULL-TERM BABIES PUT TO SLEEP IN HOSPITAL NURSERIES? Unless there is a specific reason to do otherwise, babies are placed on their backs in hospital nurseries.  IF A BABY DOES NOT SLEEP WELL ON HIS OR HER BACK, IS IT OKAY TO TURN HIM OR HER TO A SIDE OR TUMMY POSITION? No. Because of the risk of SIDS, the side and tummy positions are not recommended. Positional preference appears to be a learned behavior among infants from birth to 634 to 316 months of age. Infants who are always placed on their backs will become used to this position. If your baby  is not sleeping well, look for possible reasons. For example, be sure to avoid overheating or the use of soft bedding. AT WHAT AGE CAN YOU STOP USING THE BACK POSITION FOR SLEEP? The peak risk for SIDS is age 21 to 8224 weeks. Although less common, it can occur up to 21 year of age. It is recommended that you place your baby on his/her back up to age 17 year.  DO I NEED TO KEEP CHECKING ON MY BABY AFTER LAYING HIM OR HER DOWN FOR SLEEP IN A BACK-LYING POSITION?    No. Very young infants placed on their backs cannot roll onto their tummies. HOW SHOULD HOSPITALS PLACE BABIES DOWN FOR SLEEP IF THEY ARE READMITTED? As a general guideline, hospitalized infants should sleep on their backs just as they would at home. However, there may be a medical problem that would require a side or tummy position.  WILL BABIES ASPIRATE ON THEIR BACKS? There is no evidence that healthy babies are more likely to inhale stomach contents (have aspiration episodes) when they are on their backs. In the majority of the small number of reported cases of death due to aspiration, the infant's position at death, when known, was on their tummy. DOES SLEEPING ON THE BACK CAUSE BABIES TO HAVE FLAT HEADS? There is some suggestion that the incidence of babies developing a flat spot on their heads may have increased since the incidence of sleeping on their tummies has decreased. Usually, this is not a serious condition. This condition will disappear within several months after the baby begins to sit up. Flat spots can be avoided by altering the head position when the baby is sleeping on his/her back. Giving your baby tummy time also helps prevent the development of a flat head. SHOULD PRODUCTS BE USED TO KEEP BABIES ON THEIR BACKS OR SIDES DURING SLEEP? Although various devices have been sold to maintain babies in a back-lying position during sleep, their use is not recommended. Infants who sleep on their backs need no extra support. SHOULD SOFT SURFACES BE AVOIDED? Several studies indicate that soft sleeping surfaces increase the risk of SIDS in infants. It is unknown how soft a surface must be to pose a threat. A firm infant-mattress with no more than a thin covering such as a sheet or rubberized pad between the infant and mattress is advised. Soft, plush, or bulky items, such as pillows, rolls of bedding, or cushions in the baby's sleeping environment are strongly warned against. These items can  come into close contact with the infant's face and might cause breathing problems.  DOES BED SHARING OR CO-SLEEPING DECREEASE RISK? No.Bed sharing, while controversial, is associated with an increased risk of SIDS, especially when the mother smokes, when sleeping occurs on a couch or sofa, when there are multiple bed sharers, or when bed sharers have consumed alcohol. Sleeping in an approved crib or bassinet in the same room as the mother decreases risk of SIDS. CAN A PACIFIER DECREASE RISK? While it is not known exactly how, pacifier use during the first year of life decreases the risk of SIDS. Give your baby the pacifier when putting the baby down, but do not force a pacifier or place one in your baby's mouth once your baby has fallen asleep. Pacifiers should not have any sugary solutions applied to them and need to be cleaned regularly. Finally, if your baby is breastfeeding, it is beneficial to delay use of a pacifier in order to firmly establish breastfeeding. Document Released: 08/06/2001  Document Revised: 11/04/2011 Document Reviewed: 03/13/2009 [facility] Patient Information 2014 [place], [place].

## 2013-12-28 ENCOUNTER — Encounter: Payer: Self-pay | Admitting: Obstetrics & Gynecology

## 2013-12-30 ENCOUNTER — Encounter: Payer: Medicaid Other | Admitting: Obstetrics & Gynecology

## 2014-01-05 ENCOUNTER — Encounter: Payer: Medicaid Other | Admitting: Obstetrics & Gynecology

## 2014-01-07 ENCOUNTER — Encounter: Payer: Self-pay | Admitting: Obstetrics & Gynecology

## 2014-01-07 ENCOUNTER — Ambulatory Visit (INDEPENDENT_AMBULATORY_CARE_PROVIDER_SITE_OTHER): Payer: Medicaid Other | Admitting: Obstetrics & Gynecology

## 2014-01-07 VITALS — BP 124/71 | HR 80 | Temp 97.8°F | Wt 193.0 lb

## 2014-01-07 DIAGNOSIS — Z3480 Encounter for supervision of other normal pregnancy, unspecified trimester: Secondary | ICD-10-CM

## 2014-01-07 LAB — POCT URINALYSIS DIPSTICK
Glucose, UA: NEGATIVE
KETONES UA: NEGATIVE
Nitrite, UA: NEGATIVE
PH UA: 7
Spec Grav, UA: 1.015

## 2014-01-07 NOTE — Progress Notes (Signed)
Subjective:    [name] [name] is a 23 y.o. female being seen today for her obstetrical visit. She is at 9946w3d  gestation. Patient reports no complaints. Fetal movement: normal.   Past Medical History  Diagnosis Date  . Eczema   . Infection     chlamydia  . Trichomonas   . Chlamydia     Past Surgical History  Procedure Laterality Date  . Wisdom tooth extraction    . Fractured finger as a child      Current outpatient prescriptions:ferrous sulfate 325 (65 FE) MG tablet, Take 325 mg by mouth 3 (three) times daily with meals., Disp: , Rfl:  No Known Allergies  History  Substance Use Topics  . Smoking status: Never Smoker   . Smokeless tobacco: Never Used  . Alcohol Use: No    Family History  Problem Relation Age of Onset  . Other Neg Hx   . Asthma Sister   . Heart disease Maternal Grandmother      Review of Systems Constitutional: negative for anorexia Gastrointestinal: negative for abdominal pain Genitourinary:negative for vaginal discharge Musculoskeletal:negative for back pain Behavioral/Psych: negative for depression and tobacco use   Objective:    BP 124/71  Pulse 80  Temp(Src) 97.8 F (36.6 C)  Wt 87.544 kg (193 lb)  LMP 04/06/2013 FHT:   120 BPM  Uterine Size: size equals dates  Presentation: cephalic      Assessment:    Pregnancy @ 3146w3d  weeks   Plan:     labs reviewed, problem list updated  Orders Placed This Encounter  Procedures  . POCT urinalysis dipstick    Follow up in 1 Week.

## 2014-01-12 ENCOUNTER — Encounter (HOSPITAL_COMMUNITY): Payer: Self-pay | Admitting: *Deleted

## 2014-01-12 ENCOUNTER — Inpatient Hospital Stay (HOSPITAL_COMMUNITY): Payer: Medicaid Other | Admitting: Anesthesiology

## 2014-01-12 ENCOUNTER — Inpatient Hospital Stay (HOSPITAL_COMMUNITY)
Admission: AD | Admit: 2014-01-12 | Discharge: 2014-01-14 | DRG: 775 | Disposition: A | Discharge status: Home / Self Care | Payer: Medicaid Other | Source: Ambulatory Visit | Attending: Obstetrics & Gynecology | Admitting: Obstetrics & Gynecology

## 2014-01-12 ENCOUNTER — Encounter (HOSPITAL_COMMUNITY): Payer: Medicaid Other | Admitting: Anesthesiology

## 2014-01-12 DIAGNOSIS — Z3480 Encounter for supervision of other normal pregnancy, unspecified trimester: Secondary | ICD-10-CM

## 2014-01-12 LAB — CBC
HCT: 34.9 % — ABNORMAL LOW (ref 36.0–46.0)
Hemoglobin: 11.4 g/dL — ABNORMAL LOW (ref 12.0–15.0)
MCH: 28.5 pg (ref 26.0–34.0)
MCHC: 32.7 g/dL (ref 30.0–36.0)
MCV: 87.3 fL (ref 78.0–100.0)
PLATELETS: 171 10*3/uL (ref 150–400)
RBC: 4 MIL/uL (ref 3.87–5.11)
RDW: 16.1 % — AB (ref 11.5–15.5)
WBC: 8.2 10*3/uL (ref 4.0–10.5)

## 2014-01-12 LAB — RPR

## 2014-01-12 MED ORDER — OXYTOCIN 40 UNITS IN LACTATED RINGERS INFUSION - SIMPLE MED
62.5000 mL/h | INTRAVENOUS | Status: DC
  Administered 2014-01-12: 62.5 mL/h via INTRAVENOUS
  Filled 2014-01-12: qty 1000

## 2014-01-12 MED ORDER — PHENYLEPHRINE 40 MCG/ML (10ML) SYRINGE FOR IV PUSH (FOR BLOOD PRESSURE SUPPORT)
80.0000 ug | PREFILLED_SYRINGE | INTRAVENOUS | Status: DC | PRN
  Filled 2014-01-12: qty 10
  Filled 2014-01-12: qty 2

## 2014-01-12 MED ORDER — OXYCODONE-ACETAMINOPHEN 5-325 MG PO TABS
1.0000 | ORAL_TABLET | ORAL | Status: DC | PRN
Start: 2014-01-12 — End: 2014-01-12

## 2014-01-12 MED ORDER — LIDOCAINE HCL (PF) 1 % IJ SOLN
INTRAMUSCULAR | Status: DC | PRN
  Administered 2014-01-12 (×2): 4 mL

## 2014-01-12 MED ORDER — DIPHENHYDRAMINE HCL 50 MG/ML IJ SOLN: 12.5000 mg | INTRAMUSCULAR | Status: DC | PRN

## 2014-01-12 MED ORDER — DIPHENHYDRAMINE HCL 25 MG PO CAPS: 25.0000 mg | ORAL_CAPSULE | Freq: Four times a day (QID) | ORAL | Status: DC | PRN

## 2014-01-12 MED ORDER — SENNOSIDES-DOCUSATE SODIUM 8.6-50 MG PO TABS
2.0000 | ORAL_TABLET | ORAL | Status: DC
  Administered 2014-01-13 – 2014-01-14 (×2): 2 via ORAL
  Filled 2014-01-12 (×2): qty 2

## 2014-01-12 MED ORDER — EPHEDRINE 5 MG/ML INJ
10.0000 mg | INTRAVENOUS | Status: DC | PRN
  Filled 2014-01-12: qty 4
  Filled 2014-01-12: qty 2

## 2014-01-12 MED ORDER — DIBUCAINE 1 % RE OINT: 1.0000 "application " | TOPICAL_OINTMENT | RECTAL | Status: DC | PRN

## 2014-01-12 MED ORDER — WITCH HAZEL-GLYCERIN EX PADS: 1.0000 "application " | MEDICATED_PAD | CUTANEOUS | Status: DC | PRN

## 2014-01-12 MED ORDER — ONDANSETRON HCL 4 MG/2ML IJ SOLN: 4.0000 mg | Freq: Four times a day (QID) | INTRAMUSCULAR | Status: DC | PRN

## 2014-01-12 MED ORDER — MAGNESIUM HYDROXIDE 400 MG/5ML PO SUSP: 30.0000 mL | ORAL | Status: DC | PRN

## 2014-01-12 MED ORDER — LACTATED RINGERS IV SOLN
500.0000 mL | INTRAVENOUS | Status: DC | PRN
  Administered 2014-01-12: 500 mL via INTRAVENOUS

## 2014-01-12 MED ORDER — LANOLIN HYDROUS EX OINT: TOPICAL_OINTMENT | CUTANEOUS | Status: DC | PRN

## 2014-01-12 MED ORDER — LIDOCAINE HCL (PF) 1 % IJ SOLN
30.0000 mL | INTRAMUSCULAR | Status: DC | PRN
  Filled 2014-01-12: qty 30

## 2014-01-12 MED ORDER — ACETAMINOPHEN 325 MG PO TABS: 650.0000 mg | ORAL_TABLET | ORAL | Status: DC | PRN

## 2014-01-12 MED ORDER — MEASLES, MUMPS & RUBELLA VAC ~~LOC~~ INJ
0.5000 mL | INJECTION | Freq: Once | SUBCUTANEOUS | Status: DC
  Filled 2014-01-12: qty 0.5

## 2014-01-12 MED ORDER — CITRIC ACID-SODIUM CITRATE 334-500 MG/5ML PO SOLN: 30.0000 mL | ORAL | Status: DC | PRN

## 2014-01-12 MED ORDER — TETANUS-DIPHTH-ACELL PERTUSSIS 5-2.5-18.5 LF-MCG/0.5 IM SUSP: 0.5000 mL | Freq: Once | INTRAMUSCULAR | Status: DC

## 2014-01-12 MED ORDER — IBUPROFEN 600 MG PO TABS
600.0000 mg | ORAL_TABLET | Freq: Four times a day (QID) | ORAL | Status: DC
  Administered 2014-01-12 – 2014-01-14 (×7): 600 mg via ORAL
  Filled 2014-01-12 (×7): qty 1

## 2014-01-12 MED ORDER — PHENYLEPHRINE 40 MCG/ML (10ML) SYRINGE FOR IV PUSH (FOR BLOOD PRESSURE SUPPORT)
80.0000 ug | PREFILLED_SYRINGE | INTRAVENOUS | Status: DC | PRN
  Filled 2014-01-12: qty 2

## 2014-01-12 MED ORDER — PRENATAL MULTIVITAMIN CH
1.0000 | ORAL_TABLET | Freq: Every day | ORAL | Status: DC
  Administered 2014-01-13: 1 via ORAL
  Filled 2014-01-12: qty 1

## 2014-01-12 MED ORDER — LACTATED RINGERS IV SOLN: INTRAVENOUS | Status: DC

## 2014-01-12 MED ORDER — FENTANYL 2.5 MCG/ML BUPIVACAINE 1/10 % EPIDURAL INFUSION (WH - ANES)
INTRAMUSCULAR | Status: DC | PRN
  Administered 2014-01-12: 14 mL/h via EPIDURAL

## 2014-01-12 MED ORDER — BENZOCAINE-MENTHOL 20-0.5 % EX AERO
1.0000 "application " | INHALATION_SPRAY | CUTANEOUS | Status: DC | PRN
  Administered 2014-01-12: 1 via TOPICAL
  Filled 2014-01-12: qty 56

## 2014-01-12 MED ORDER — FENTANYL 2.5 MCG/ML BUPIVACAINE 1/10 % EPIDURAL INFUSION (WH - ANES)
14.0000 mL/h | INTRAMUSCULAR | Status: DC | PRN
  Filled 2014-01-12: qty 125

## 2014-01-12 MED ORDER — OXYCODONE-ACETAMINOPHEN 5-325 MG PO TABS
1.0000 | ORAL_TABLET | ORAL | Status: DC | PRN
  Administered 2014-01-12: 1 via ORAL
  Administered 2014-01-12: 2 via ORAL
  Administered 2014-01-13: 1 via ORAL
  Administered 2014-01-13: 2 via ORAL
  Administered 2014-01-14 (×2): 1 via ORAL
  Filled 2014-01-12: qty 2
  Filled 2014-01-12: qty 1
  Filled 2014-01-12: qty 2
  Filled 2014-01-12 (×3): qty 1

## 2014-01-12 MED ORDER — EPHEDRINE 5 MG/ML INJ
10.0000 mg | INTRAVENOUS | Status: DC | PRN
  Filled 2014-01-12: qty 2

## 2014-01-12 MED ORDER — FERROUS SULFATE 325 (65 FE) MG PO TABS
325.0000 mg | ORAL_TABLET | Freq: Two times a day (BID) | ORAL | Status: DC
  Administered 2014-01-12 – 2014-01-14 (×4): 325 mg via ORAL
  Filled 2014-01-12 (×4): qty 1

## 2014-01-12 MED ORDER — IBUPROFEN 600 MG PO TABS
600.0000 mg | ORAL_TABLET | Freq: Four times a day (QID) | ORAL | Status: DC | PRN
  Administered 2014-01-12: 600 mg via ORAL
  Filled 2014-01-12: qty 1

## 2014-01-12 MED ORDER — LACTATED RINGERS IV SOLN
500.0000 mL | Freq: Once | INTRAVENOUS | Status: AC
  Administered 2014-01-12: 500 mL via INTRAVENOUS

## 2014-01-12 MED ORDER — FLEET ENEMA 7-19 GM/118ML RE ENEM: 1.0000 | ENEMA | RECTAL | Status: DC | PRN

## 2014-01-12 MED ORDER — ZOLPIDEM TARTRATE 5 MG PO TABS: 5.0000 mg | ORAL_TABLET | Freq: Every evening | ORAL | Status: DC | PRN

## 2014-01-12 MED ORDER — ONDANSETRON HCL 4 MG PO TABS: 4.0000 mg | ORAL_TABLET | ORAL | Status: DC | PRN

## 2014-01-12 MED ORDER — ONDANSETRON HCL 4 MG/2ML IJ SOLN: 4.0000 mg | INTRAMUSCULAR | Status: DC | PRN

## 2014-01-12 MED ORDER — OXYTOCIN BOLUS FROM INFUSION: 500.0000 mL | INTRAVENOUS | Status: DC

## 2014-01-12 NOTE — MAU Note (Signed)
Reports contractions, denies bleeding or ROM

## 2014-01-12 NOTE — Anesthesia Preprocedure Evaluation (Signed)

## 2014-01-12 NOTE — Progress Notes (Signed)
Delivery of live viable female by Dr Tamela OddiJackson-Moore, APGARS 8,9

## 2014-01-12 NOTE — H&P (Signed)
[name] [name] is a 23 y.o. female presenting for UC's. History OB History   Grav Para Term Preterm Abortions TAB SAB Ect Mult Living   4 2 2  1  1   2      Past Medical History  Diagnosis Date  . Eczema   . Infection     chlamydia  . Trichomonas   . Chlamydia    Past Surgical History  Procedure Laterality Date  . Wisdom tooth extraction    . Fractured finger as a child     Family History: family history includes Asthma in her sister; Heart disease in her maternal grandmother. There is no history of Other. Social History:  reports that she has never smoked. She has never used smokeless tobacco. She reports that she does not drink alcohol or use illicit drugs.   Prenatal Transfer Tool  Maternal Diabetes: No Genetic Screening: Normal Maternal Ultrasounds/Referrals: Normal Fetal Ultrasounds or other Referrals:  None Maternal Substance Abuse:  No Significant Maternal Medications:  None Significant Maternal Lab Results:  Lab values include: Group B Strep negative Other Comments:  None  Review of Systems  All other systems reviewed and are negative.   Dilation: 5 Effacement (%): 80 Station: -2 Exam by:: A. Gagliardo, RN Blood pressure 127/63, pulse 84, temperature 99.3 F (37.4 C), temperature source Oral, resp. rate 18, height 5\' 5"  (1.651 m), weight 197 lb (89.359 kg), last menstrual period 04/06/2013, SpO2 99.00%. Maternal Exam:  Abdomen: Patient reports no abdominal tenderness. Fetal presentation: vertex  Introitus: Normal vulva. Normal vagina.  Pelvis: adequate for delivery.   Cervix: Cervix evaluated by digital exam.     Physical Exam  Nursing note and vitals reviewed. Constitutional: She is oriented to person, place, and time. She appears well-developed and well-nourished.  HENT:  Head: Normocephalic and atraumatic.  Eyes: Conjunctivae are normal. Pupils are equal, round, and reactive to light.  Neck: Normal range of motion. Neck supple.  Cardiovascular:  Normal rate and regular rhythm.   Respiratory: Effort normal.  GI: Soft.  Genitourinary: Vagina normal and uterus normal.  Musculoskeletal: Normal range of motion.  Neurological: She is alert and oriented to person, place, and time.  Skin: Skin is warm and dry.  Psychiatric: She has a normal mood and affect. Her behavior is normal. Judgment and thought content normal.    Prenatal labs: ABO, Rh: A/Positive/-- (12/15 0000) Antibody: Negative (12/15 0000) Rubella: Immune (12/15 0000) RPR: NON REAC (03/18 1321)  HBsAg: Negative (12/15 0000)  HIV: NON REACTIVE (03/18 1321)  GBS: NEGATIVE (04/15 1156)   Assessment/Plan: 40.1 weeks.  Active labor.  Admit.   Brock Badharles A Harper 01/12/2014, 3:11 AM

## 2014-01-12 NOTE — Progress Notes (Signed)
[name] [name] is a 23 y.o. [id number] at 7834w1d by LMP admitted for active labor  Subjective:   Objective: BP 110/67  Pulse 82  Temp(Src) 97.3 F (36.3 C) (Oral)  Resp 18  Ht 5\' 5"  (1.651 m)  Wt 197 lb (89.359 kg)  BMI 32.78 kg/m2  SpO2 97%  LMP 04/06/2013 I/O last 3 completed shifts: In: -  Out: 1150 [Urine:1150]    FHT:  120-130 bpm UC:   regular, every 2-3 minutes SVE:   Dilation: 8 Effacement (%): 100 Station: 0 Exam by:: L. MCdaniel RN  Labs: Lab Results  Component Value Date   WBC 8.2 01/12/2014   HGB 11.4* 01/12/2014   HCT 34.9* 01/12/2014   MCV 87.3 01/12/2014   PLT 171 01/12/2014    Assessment / Plan: Spontaneous labor, progressing normally  Labor: Progressing normally Preeclampsia:  n/a Fetal Wellbeing:  Category I Pain Control:  Epidural I/D:  n/a Anticipated MOD:  NSVD  Brock Badharles A Harper 01/12/2014, 9:25 AM

## 2014-01-12 NOTE — Anesthesia Procedure Notes (Signed)
Epidural Patient location during procedure: OB Start time: 01/12/2014 4:05 AM  Staffing Anesthesiologist: Malen GauzeFOSTER, Jamie-Lee Galdamez A. Performed by: anesthesiologist   Preanesthetic Checklist Completed: patient identified, site marked, surgical consent, pre-op evaluation, timeout performed, IV checked, risks and benefits discussed and monitors and equipment checked  Epidural Patient position: sitting Prep: site prepped and draped and DuraPrep Patient monitoring: continuous pulse ox and blood pressure Approach: midline Location: L3-L4 Injection technique: LOR air  Needle:  Needle type: Tuohy  Needle gauge: 17 G Needle length: 9 cm and 9 Needle insertion depth: 6 cm Catheter type: closed end flexible Catheter size: 19 Gauge Catheter at skin depth: 11 cm Test dose: negative and Other  Assessment Events: blood not aspirated, injection not painful, no injection resistance, negative IV test and no paresthesia  Additional Notes Patient identified. Risks and benefits discussed including failed block, incomplete  Pain control, post dural puncture headache, nerve damage, paralysis, blood pressure Changes, nausea, vomiting, reactions to medications-both toxic and allergic and post Partum back pain. All questions were answered. Patient expressed understanding and wished to proceed. Sterile technique was used throughout procedure. Epidural site was Dressed with sterile barrier dressing. No paresthesias, signs of intravascular injection Or signs of intrathecal spread were encountered.  Patient was more comfortable after the epidural was dosed. Please see RN's note for documentation of vital signs and FHR which are stable.

## 2014-01-13 ENCOUNTER — Encounter: Payer: Medicaid Other | Admitting: Obstetrics & Gynecology

## 2014-01-13 NOTE — Progress Notes (Signed)
UR completed 

## 2014-01-13 NOTE — Progress Notes (Signed)
Post Partum Day 1 Subjective: no complaints  Objective: Blood pressure 108/63, pulse 69, temperature 97.5 F (36.4 C), temperature source Oral, resp. rate 17, height 5\' 5"  (1.651 m), weight 197 lb (89.359 kg), last menstrual period 04/06/2013, SpO2 97.00%, unknown if currently breastfeeding.  Physical Exam:  General: alert and no distress Lochia: appropriate Uterine Fundus: firm Incision: none DVT Evaluation: No evidence of DVT seen on physical exam.   Recent Labs  01/12/14 0315  HGB 11.4*  HCT 34.9*    Assessment/Plan: Plan for discharge tomorrow   LOS: 1 day   [name] [name] 01/13/2014, 8:34 AM

## 2014-01-13 NOTE — Anesthesia Postprocedure Evaluation (Signed)
Anesthesia Post Note  Patient: [name] [name]  Procedure(s) Performed: * No procedures listed *  Anesthesia type: Epidural  Patient location: Mother/Baby  Post pain: Pain level controlled  Post assessment: Post-op Vital signs reviewed  Last Vitals:  Filed Vitals:   01/13/14 0546  BP: 108/63  Pulse: 69  Temp: 36.4 C  Resp: 17    Post vital signs: Reviewed  Level of consciousness:alert  Complications: No apparent anesthesia complications

## 2014-01-14 MED ORDER — IBUPROFEN 600 MG PO TABS: 600.0000 mg | ORAL_TABLET | Freq: Four times a day (QID) | ORAL | Status: DC

## 2014-01-14 MED ORDER — OXYCODONE-ACETAMINOPHEN 5-325 MG PO TABS: 1.0000 | ORAL_TABLET | ORAL | Status: DC | PRN

## 2014-01-14 NOTE — Discharge Summary (Signed)
Obstetric Discharge Summary Reason for Admission: onset of labor Prenatal Procedures: none Intrapartum Procedures: spontaneous vaginal delivery Postpartum Procedures: none Complications-Operative and Postpartum: none Hemoglobin  Date Value Ref Range Status  01/12/2014 11.4* 12.0 - 15.0 g/dL Final  93/81/0175 8.7   Final     HCT  Date Value Ref Range Status  01/12/2014 34.9* 36.0 - 46.0 % Final  08/09/2013 26   Final    Physical Exam:  General: alert and cooperative Lochia: appropriate Uterine Fundus: firm Incision: NA DVT Evaluation: No evidence of DVT seen on physical exam.  Discharge Diagnoses: Term Pregnancy-delivered  Discharge Information: Date: 01/14/2014 Activity: pelvic rest Diet: routine Medications: Ibuprofen and Percocet Condition: stable Instructions: refer to practice specific booklet Discharge to: home   Newborn Data: Live born female  Birth Weight: 7 lb 9 oz (3430 g) APGAR: 8, 9  Home with mother.  [name] [name] Dessa Phi 01/14/2014, 8:56 AM

## 2014-02-07 ENCOUNTER — Ambulatory Visit: Payer: Medicaid Other | Admitting: Obstetrics & Gynecology

## 2014-02-23 ENCOUNTER — Ambulatory Visit: Payer: Medicaid Other | Admitting: Obstetrics & Gynecology

## 2014-03-07 ENCOUNTER — Encounter: Payer: Self-pay | Admitting: Obstetrics & Gynecology

## 2014-03-07 ENCOUNTER — Ambulatory Visit (INDEPENDENT_AMBULATORY_CARE_PROVIDER_SITE_OTHER): Payer: Medicaid Other | Admitting: Obstetrics & Gynecology

## 2014-03-07 VITALS — BP 117/72 | HR 98 | Temp 97.6°F | Ht 63.0 in | Wt 174.0 lb

## 2014-03-07 DIAGNOSIS — Z113 Encounter for screening for infections with a predominantly sexual mode of transmission: Secondary | ICD-10-CM

## 2014-03-07 DIAGNOSIS — Z01818 Encounter for other preprocedural examination: Secondary | ICD-10-CM

## 2014-03-07 NOTE — Patient Instructions (Signed)
Levonorgestrel intrauterine device (IUD) What is this medicine? LEVONORGESTREL IUD (LEE voe nor jes trel) is a contraceptive (birth control) device. The device is placed inside the uterus by a healthcare professional. It is used to prevent pregnancy and can also be used to treat heavy bleeding that occurs during your period. Depending on the device, it can be used for 3 to 5 years. This medicine may be used for other purposes; ask your health care provider or pharmacist if you have questions. COMMON BRAND NAME(S): Mirena, Skyla What should I tell my health care provider before I take this medicine? They need to know if you have any of these conditions: -abnormal Pap smear -cancer of the breast, uterus, or cervix -diabetes -endometritis -genital or pelvic infection now or in the past -have more than one sexual partner or your partner has more than one partner -heart disease -history of an ectopic or tubal pregnancy -immune system problems -IUD in place -liver disease or tumor -problems with blood clots or take blood-thinners -use intravenous drugs -uterus of unusual shape -vaginal bleeding that has not been explained -an unusual or allergic reaction to levonorgestrel, other hormones, silicone, or polyethylene, medicines, foods, dyes, or preservatives -pregnant or trying to get pregnant -breast-feeding How should I use this medicine? This device is placed inside the uterus by a health care professional. Talk to your pediatrician regarding the use of this medicine in children. Special care may be needed. Overdosage: If you think you have taken too much of this medicine contact a poison control center or emergency room at once. NOTE: This medicine is only for you. Do not share this medicine with others. What if I miss a dose? This does not apply. What may interact with this medicine? Do not take this medicine with any of the following  medications: -amprenavir -bosentan -fosamprenavir This medicine may also interact with the following medications: -aprepitant -barbiturate medicines for inducing sleep or treating seizures -bexarotene -griseofulvin -medicines to treat seizures like carbamazepine, ethotoin, felbamate, oxcarbazepine, phenytoin, topiramate -modafinil -pioglitazone -rifabutin -rifampin -rifapentine -some medicines to treat HIV infection like atazanavir, indinavir, lopinavir, nelfinavir, tipranavir, ritonavir -St. John's wort -warfarin This list may not describe all possible interactions. Give your health care provider a list of all the medicines, herbs, non-prescription drugs, or dietary supplements you use. Also tell them if you smoke, drink alcohol, or use illegal drugs. Some items may interact with your medicine. What should I watch for while using this medicine? Visit your doctor or health care professional for regular check ups. See your doctor if you or your partner has sexual contact with others, becomes HIV positive, or gets a sexual transmitted disease. This product does not protect you against HIV infection (AIDS) or other sexually transmitted diseases. You can check the placement of the IUD yourself by reaching up to the top of your vagina with clean fingers to feel the threads. Do not pull on the threads. It is a good habit to check placement after each menstrual period. Call your doctor right away if you feel more of the IUD than just the threads or if you cannot feel the threads at all. The IUD may come out by itself. You may become pregnant if the device comes out. If you notice that the IUD has come out use a backup birth control method like condoms and call your health care provider. Using tampons will not change the position of the IUD and are okay to use during your period. What side effects may I   notice from receiving this medicine? Side effects that you should report to your doctor or  health care professional as soon as possible: -allergic reactions like skin rash, itching or hives, swelling of the face, lips, or tongue -fever, flu-like symptoms -genital sores -high blood pressure -no menstrual period for 6 weeks during use -pain, swelling, warmth in the leg -pelvic pain or tenderness -severe or sudden headache -signs of pregnancy -stomach cramping -sudden shortness of breath -trouble with balance, talking, or walking -unusual vaginal bleeding, discharge -yellowing of the eyes or skin Side effects that usually do not require medical attention (report to your doctor or health care professional if they continue or are bothersome): -acne -breast pain -change in sex drive or performance -changes in weight -cramping, dizziness, or faintness while the device is being inserted -headache -irregular menstrual bleeding within first 3 to 6 months of use -nausea This list may not describe all possible side effects. Call your doctor for medical advice about side effects. You may report side effects to FDA at 1-800-FDA-1088. Where should I keep my medicine? This does not apply. NOTE: This sheet is a summary. It may not cover all possible information. If you have questions about this medicine, talk to your doctor, pharmacist, or health care provider.  2015, Elsevier/Gold Standard. (2011-09-12 13:54:04) Health Maintenance - 67-34 Years Old SCHOOL PERFORMANCE After high school, you may attend college or technical or vocational school, enroll in the [employer], or enter the workforce. PHYSICAL, SOCIAL, AND EMOTIONAL DEVELOPMENT  One hour of regular physical activity daily is recommended. Continue to participate in sports.  Develop your own interests and consider community service or volunteerism.  Make decisions about college and work plans.  Throughout these years, you should assume responsibility for your own health care. Increasing independence is important for you.  You  may be exploring your sexual identity. Understand that you should never be in a situation that makes you feel uncomfortable, and tell your partner if you do not want to engage in sexual activity.  Body image may become important to you. Be mindful that eating disorders can develop at this time. Talk to your parents or other caregivers if you have concerns about body image, weight gain, or losing weight.  You may notice mood disturbances, depression, anxiety, attention problems, or trouble with alcohol. Talk to your health care provider if you have concerns about mental illness.  Set limits for yourself and talk with your parents or other caregivers about independent decision making.  Handle conflict without physical violence.  Avoid loud noises which may impair hearing.  Limit television and computer time to 2 hours each day. Individuals who engage in excessive inactivity are more likely to become overweight. RECOMMENDED IMMUNIZATIONS  Influenza vaccine.  All adults should be immunized every year.  All adults, including pregnant women and people with hives-only allergy to eggs, can receive the inactivated influenza (IIV) vaccine.  Adults aged 18-49 years can receive the recombinant influenza (RIV) vaccine. The RIV vaccine does not contain any egg protein.  Tetanus, diphtheria, and acellular pertussis (Td, Tdap) vaccine.  Pregnant women should receive 1 dose of Tdap vaccine during each pregnancy. The dose should be obtained regardless of the length of time since the last dose. Immunization is preferred during the 27th to 36th week of gestation.  An adult who has not previously received Tdap or who does not know his or her vaccine status should receive 1 dose of Tdap. This initial dose should be followed by tetanus and  diphtheria toxoids (Td) booster doses every 10 years.  Adults with an unknown or incomplete history of completing a 3-dose immunization series with Td-containing vaccines  should begin or complete a primary immunization series including a Tdap dose.  Adults should receive a Td booster every 10 years.  Varicella vaccine.  An adult without evidence of immunity to varicella should receive 2 doses or a second dose if he or she has previously received 1 dose.  Pregnant females who do not have evidence of immunity should receive the first dose after pregnancy. This first dose should be obtained before leaving the health care facility. The second dose should be obtained 4-8 weeks after the first dose.  Human papillomavirus (HPV) vaccine.  Females aged 13-26 years who have not received the vaccine previously should obtain the 3-dose series.  The vaccine is not recommended for pregnant females. However, pregnancy testing is not needed before receiving a dose. If a female is found to be pregnant after receiving a dose, no treatment is needed. In that case, the remaining doses should be delayed until after the pregnancy.  Males aged 5-21 years who have not received the vaccine previously should receive the 3-dose series. Males aged 22-26 years may be immunized.  Immunization is recommended through the age of 40 years for any female who has sex with males and did not get any or all doses earlier.  Immunization is recommended for any person with an immunocompromised condition through the age of 50 years if he or she did not get any or all doses earlier.  During the 3-dose series, the second dose should be obtained 4-8 weeks after the first dose. The third dose should be obtained 24 weeks after the first dose and 16 weeks after the second dose.  Measles, mumps, and rubella (MMR) vaccine.  Adults born in [birth year] or later should have 1 or more doses of MMR vaccine unless there is a contraindication to the vaccine or there is laboratory evidence of immunity to each of the three diseases.  A routine second dose of MMR vaccine should be obtained at least 28 days after the first  dose for students attending postsecondary schools, health care workers, and international travelers.  For females of childbearing age, rubella immunity should be determined. If there is no evidence of immunity, females who are not pregnant should be vaccinated. If there is no evidence of immunity, females who are pregnant should delay immunization until after pregnancy.  Pneumococcal 13-valent conjugate (PCV13) vaccine.  When indicated, a person who is uncertain of his or her immunization history and has no record of immunization should receive the PCV13 vaccine.  An adult aged 4 years or older who has certain medical conditions and has not been previously immunized should receive 1 dose of PCV13 vaccine. This PCV13 should be followed with a dose of pneumococcal polysaccharide (PPSV23) vaccine. The PPSV23 vaccine dose should be obtained at least 8 weeks after the dose of PCV13 vaccine.  An adult aged 43 years or older who has certain medical conditions and previously received 1 or more doses of PPSV23 vaccine should receive 1 dose of PCV13. The PCV13 vaccine dose should be obtained 1 or more years after the last PPSV23 vaccine dose.  Pneumococcal polysaccharide (PPSV23) vaccine.  When PCV13 is also indicated, PCV13 should be obtained first.  An adult younger than age 65 years who has certain medical conditions should be immunized.  Any person who resides in a long-term care facility should be immunized.  An adult smoker should be immunized.  People with an immunocompromised condition and certain other conditions should receive both PCV13 and PPSV23 vaccines.  People with human immunodeficiency virus (HIV) infection should be immunized as soon as possible after diagnosis.  Immunization during chemotherapy or radiation therapy should be avoided.  Routine use of PPSV23 vaccine is not recommended for American Indians, [address], or people younger than 65 years unless there are medical  conditions that require PPSV23 vaccine.  When indicated, people who have unknown immunization and have no record of immunization should receive PPSV23 vaccine.  One-time revaccination 5 years after the first dose of PPSV23 is recommended for people aged 19-64 years who have chronic kidney failure, nephrotic syndrome, asplenia, or immunocompromised conditions.  Meningococcal vaccine.  Adults with asplenia or persistent complement component deficiencies should receive 2 doses of quadrivalent meningococcal conjugate (MenACWY-D) vaccine. The doses should be obtained at least 2 months apart.  Microbiologists working with certain meningococcal bacteria, military recruits, people at risk during an outbreak, and people who travel to or live in countries with a high rate of meningitis should be immunized.  A first-year college student up through age 49 years who is living in a residence hall should receive a dose if he or she did not receive a dose on or after his or her 16th birthday.  Adults who have certain high-risk conditions should receive one or more doses of vaccine.  Hepatitis A vaccine.  Adults who wish to be protected from this disease, have certain high-risk conditions, work with hepatitis A-infected animals, work in hepatitis A research labs, or travel to or work in countries with a high rate of hepatitis A should be immunized.  Adults who were previously unvaccinated and who anticipate close contact with an international adoptee during the first 60 days after arrival in the [place] from a country with a high rate of hepatitis A should be immunized.  Hepatitis B vaccine.  Adults who wish to be protected from this disease, have certain high-risk conditions, may be exposed to blood or other infectious body fluids, are household contacts or sex partners of hepatitis B positive people, are clients or workers in certain care facilities, or travel to or work in countries with a high rate  of hepatitis B should be immunized.  Haemophilus influenzae type b (Hib) vaccine.  A previously unvaccinated person with asplenia or sickle cell disease or having a scheduled splenectomy should receive 1 dose of Hib vaccine.  Regardless of previous immunization, a recipient of a hematopoietic stem cell transplant should receive a 3-dose series 6-12 months after his or her successful transplant.  Hib vaccine is not recommended for adults with HIV infection. TESTING  Annual screening for vision and hearing problems is recommended. Vision should be screened at least once between 70-81 years of age.  You may be screened for anemia or tuberculosis.  You should have a blood test to check for high cholesterol.  You should be screened for alcohol and drug use.  If you are sexually active, you may be screened for sexually transmitted infections (STIs), pregnancy, or HIV. You should be screened for STIs if:  Your sexual activity has changed since the last screening test, and you are at an increased risk for chlamydia or gonorrhea. Ask your health care provider if you are at risk.  If you are at an increased risk for hepatitis B, you should be screened for this virus. You are considered at high risk for hepatitis B  if you:  Were born in a country where hepatitis B occurs often. Talk with your health care provider about which countries are considered high risk.  Have parents who were born in a high-risk country and have not received a shot to protect against hepatitis B (hepatitis B vaccine).  Have HIV or AIDS.  Use needles to inject street drugs.  Live with or have sex with someone who has hepatitis B.  Are a man who has sex with other men (MSM).  Get hemodialysis treatment.  Take certain medicines for conditions like cancer, organ transplantation, or autoimmune conditions. NUTRITION   You should:  Have three servings of low-fat milk and dairy products daily. If you do not drink milk  or consume dairy products, you should eat calcium-enriched foods, such as juice, bread, or cereal. Dark, leafy greens or canned fish are alternate sources of calcium.  Drink plenty of water. Fruit juice should be limited to 8-12 oz (240-360 mL) each day. Sugary beverages and sodas should be avoided.  Avoid eating foods high in fat, salt, or sugar, such as chips, candy, and cookies.  Avoid fast foods and limit eating out at restaurants.  Try not to skip meals, especially breakfast. You should eat a variety of vegetables, fruits, and lean meats.  Eat meals together as a family whenever possible. ORAL HEALTH Brush your teeth twice a day and floss at least once a day. You should have two dental exams a year.  SKIN CARE You should wear sunscreen when out in the sun. TALK TO SOMEONE ABOUT:  Precautions against pregnancy, contraception, and sexually transmitted infections.  Taking a prescription medicine daily to prevent HIV infection if you are at risk of being infected with HIV. This is called preexposure prophylaxis (PrEP). You are at risk if you:  Are a female who has sex with other males (MSM).  Are heterosexual and sexually active with more than one partner.  Take drugs by injection.  Are sexually active with a partner who has HIV.  Whether you are at high risk of being infected with HIV. If you choose to begin PrEP, you should first be tested for HIV. You should then be tested every 3 months for as long as you are taking PrEP.  Drug, tobacco, and alcohol use among your friends or at friends' homes. Smoking tobacco or marijuana and taking drugs have health consequences and may impact your brain development.  Appropriate use of over-the-counter or prescription medicines.  Driving guidelines and riding with friends.  The risks of drinking and driving or boating. Call someone if you have been drinking or using drugs and need a ride. WHAT'S NEXT? Visit your pediatrician or family  physician once a year. By young adulthood, you should transition from your pediatrician to a family physician or internal medicine specialist. If you are a female and are sexually active, you may want to begin annual physical exams with a gynecologist. Document Released: 11/07/2006 Document Revised: 08/17/2013 Document Reviewed: 11/27/2006 [facility] Patient Information 2015 [place], [place]. This information is not intended to replace advice given to you by your health care provider. Make sure you discuss any questions you have with your health care provider.

## 2014-03-07 NOTE — Progress Notes (Signed)
Patient ID: [name] [name], female   DOB: 1991/04/27, 23 y.o.   MRN: [id number] Subjective:     [name] [name] is a 23 y.o. female who presents for a postpartum visit. She is 8 weeks postpartum following a spontaneous vaginal delivery. I have fully reviewed the prenatal and intrapartum course. The delivery was at term. Outcome: spontaneous vaginal delivery. Anesthesia: epidural. Postpartum course has been normal. Baby's course has been unremarkable. Baby is feeding by bottle. Bleeding no bleeding. Bowel function is normal. Bladder function is normal. Patient is not sexually active. Contraception method is none. Postpartum depression screening: negative.  Tobacco, alcohol and substance abuse history reviewed.  Adult immunizations reviewed including TDAP, rubella and varicella.  The following portions of the patient's history were reviewed and updated as appropriate: allergies, current medications, past family history, past medical history, past social history, past surgical history and problem list.  Review of Systems Pertinent items are noted in HPI.   Objective:    BP 117/72  Pulse 98  Temp(Src) 97.6 F (36.4 C)  Ht 5\' 3"  (1.6 m)  Wt 78.926 kg (174 lb)  BMI 30.83 kg/m2  LMP 02/21/2014  Breastfeeding? No  General:  alert   Breasts:  negative  Lungs: clear to auscultation bilaterally  Heart:  regular rate and rhythm, S1, S2 normal, no murmur, click, rub or gallop  Abdomen: soft, non-tender; bowel sounds normal; no masses,  no organomegaly   Vulva:  normal  Vagina: normal vagina  Cervix:  no lesions  Corpus: normal  Adnexa:  normal adnexa  Rectal Exam: Not performed.           Assessment:     Normal postpartum exam.  Plan:     Contraception: OCP for now  Return for Mirena IUD

## 2014-03-08 LAB — GC/CHLAMYDIA PROBE AMP
CT Probe RNA: NEGATIVE
GC Probe RNA: NEGATIVE

## 2014-03-09 ENCOUNTER — Ambulatory Visit: Payer: Medicaid Other | Admitting: Obstetrics & Gynecology

## 2014-04-13 ENCOUNTER — Ambulatory Visit: Payer: Self-pay | Admitting: Obstetrics & Gynecology

## 2014-05-25 ENCOUNTER — Encounter (HOSPITAL_COMMUNITY): Payer: Self-pay

## 2014-05-25 ENCOUNTER — Inpatient Hospital Stay (HOSPITAL_COMMUNITY)
Admission: AD | Admit: 2014-05-25 | Discharge: 2014-05-25 | Discharge status: Against Medical Advice | Payer: Medicaid Other | Source: Ambulatory Visit | Attending: Obstetrics & Gynecology | Admitting: Obstetrics & Gynecology

## 2014-05-25 ENCOUNTER — Inpatient Hospital Stay (HOSPITAL_COMMUNITY)
Admission: AD | Admit: 2014-05-25 | Discharge: 2014-05-25 | Disposition: A | Discharge status: Home / Self Care | Payer: Medicaid Other | Source: Ambulatory Visit | Attending: Obstetrics & Gynecology | Admitting: Obstetrics & Gynecology

## 2014-05-25 DIAGNOSIS — Z3201 Encounter for pregnancy test, result positive: Secondary | ICD-10-CM | POA: Insufficient documentation

## 2014-05-25 DIAGNOSIS — Z3200 Encounter for pregnancy test, result unknown: Secondary | ICD-10-CM | POA: Diagnosis present

## 2014-05-25 LAB — POCT PREGNANCY, URINE: PREG TEST UR: POSITIVE — AB

## 2014-05-25 NOTE — MAU Provider Note (Signed)
Pt seen for pregnancy test only.  Her pregnancy test is positive.  She denies vaginal bleeding or abdominal pain.  She has no further questions or concerns.   First trimester precautions given.   [facility]  PNV qd.   Pregnancy verification letter provided and pt discharged to home.  Dorris CarnesK. Teague Clark, PA-C

## 2014-05-25 NOTE — MAU Note (Signed)
Pt not in lobby, second call

## 2014-05-25 NOTE — MAU Note (Signed)
Pt not in lobby when called for third time

## 2014-05-25 NOTE — MAU Note (Signed)
Pt states here for pregnancy verification letter only. Denies pain or bleeding. LMP-04/18/2014./

## 2014-05-25 NOTE — MAU Note (Signed)
Pt not in lobby.  

## 2014-05-26 NOTE — MAU Provider Note (Signed)
Attestation of Attending Supervision of Advanced Practitioner (PA/CNM/NP): Evaluation and management procedures were performed by the Advanced Practitioner under my supervision and collaboration.  I have reviewed the Advanced Practitioner's note and chart, and I agree with the management and plan.  Adric Wrede, MD, FACOG Attending Obstetrician & Gynecologist Faculty Practice, [facility]   

## 2014-06-15 ENCOUNTER — Inpatient Hospital Stay (HOSPITAL_COMMUNITY)
Admission: AD | Admit: 2014-06-15 | Discharge: 2014-06-15 | Disposition: A | Discharge status: Home / Self Care | Payer: Medicaid Other | Source: Ambulatory Visit | Attending: Obstetrics & Gynecology | Admitting: Obstetrics & Gynecology

## 2014-06-15 ENCOUNTER — Encounter (HOSPITAL_COMMUNITY): Payer: Self-pay | Admitting: *Deleted

## 2014-06-15 ENCOUNTER — Inpatient Hospital Stay (HOSPITAL_COMMUNITY): Payer: Medicaid Other

## 2014-06-15 DIAGNOSIS — O26851 Spotting complicating pregnancy, first trimester: Secondary | ICD-10-CM | POA: Diagnosis not present

## 2014-06-15 DIAGNOSIS — O43891 Other placental disorders, first trimester: Secondary | ICD-10-CM | POA: Insufficient documentation

## 2014-06-15 DIAGNOSIS — Z3A08 8 weeks gestation of pregnancy: Secondary | ICD-10-CM | POA: Diagnosis not present

## 2014-06-15 DIAGNOSIS — A549 Gonococcal infection, unspecified: Secondary | ICD-10-CM

## 2014-06-15 DIAGNOSIS — O468X1 Other antepartum hemorrhage, first trimester: Secondary | ICD-10-CM

## 2014-06-15 DIAGNOSIS — O418X10 Other specified disorders of amniotic fluid and membranes, first trimester, not applicable or unspecified: Secondary | ICD-10-CM

## 2014-06-15 HISTORY — DX: Gonococcal infection, unspecified: A54.9

## 2014-06-15 LAB — CBC
HEMATOCRIT: 34.4 % — AB (ref 36.0–46.0)
HEMOGLOBIN: 11.8 g/dL — AB (ref 12.0–15.0)
MCH: 30.8 pg (ref 26.0–34.0)
MCHC: 34.3 g/dL (ref 30.0–36.0)
MCV: 89.8 fL (ref 78.0–100.0)
PLATELETS: 239 10*3/uL (ref 150–400)
RBC: 3.83 MIL/uL — ABNORMAL LOW (ref 3.87–5.11)
RDW: 13.4 % (ref 11.5–15.5)
WBC: 7.9 10*3/uL (ref 4.0–10.5)

## 2014-06-15 LAB — WET PREP, GENITAL
Trich, Wet Prep: NONE SEEN
Yeast Wet Prep HPF POC: NONE SEEN

## 2014-06-15 LAB — HCG, QUANTITATIVE, PREGNANCY: HCG, BETA CHAIN, QUANT, S: 103866 m[IU]/mL — AB (ref <5)

## 2014-06-15 LAB — OB RESULTS CONSOLE GC/CHLAMYDIA
Chlamydia: NEGATIVE
GC PROBE AMP, GENITAL: NEGATIVE

## 2014-06-15 NOTE — Discharge Instructions (Signed)
Pelvic Rest °Pelvic rest is sometimes recommended for women when:  °· The placenta is partially or completely covering the opening of the cervix (placenta previa). °· There is bleeding between the uterine wall and the amniotic sac in the first trimester (subchorionic hemorrhage). °· The cervix begins to open without labor starting (incompetent cervix, cervical insufficiency). °· The labor is too early (preterm labor). °HOME CARE INSTRUCTIONS °· Do not have sexual intercourse, stimulation, or an orgasm. °· Do not use tampons, douche, or put anything in the vagina. °· Do not lift anything over 10 pounds (4.5 kg). °· Avoid strenuous activity or straining your pelvic muscles. °SEEK MEDICAL CARE IF:  °· You have any vaginal bleeding during pregnancy. Treat this as a potential emergency. °· You have cramping pain felt low in the stomach (stronger than menstrual cramps). °· You notice vaginal discharge (watery, mucus, or bloody). °· You have a low, dull backache. °· There are regular contractions or uterine tightening. °SEEK IMMEDIATE MEDICAL CARE IF: °You have vaginal bleeding and have placenta previa.  °Document Released: 12/07/2010 Document Revised: 11/04/2011 Document Reviewed: 12/07/2010 °ExitCare® Patient Information ©2015 [employer]. This information is not intended to replace advice given to you by your health care provider. Make sure you discuss any questions you have with your health care provider. ° °Subchorionic Hematoma °A subchorionic hematoma is a gathering of blood between the outer wall of the placenta and the inner wall of the womb (uterus). The placenta is the organ that connects the fetus to the wall of the uterus. The placenta performs the feeding, breathing (oxygen to the fetus), and waste removal (excretory work) of the fetus.  °Subchorionic hematoma is the most common abnormality found on a result from ultrasonography done during the first trimester or early second trimester of pregnancy. If  there has been little or no vaginal bleeding, early small hematomas usually shrink on their own and do not affect your baby or pregnancy. The blood is gradually absorbed over 1-2 weeks. When bleeding starts later in pregnancy or the hematoma is larger or occurs in an older pregnant woman, the outcome may not be as good. Larger hematomas may get bigger, which increases the chances for miscarriage. Subchorionic hematoma also increases the risk of premature detachment of the placenta from the uterus, preterm (premature) labor, and stillbirth. °HOME CARE INSTRUCTIONS °· Stay on bed rest if your health care provider recommends this. Although bed rest will not prevent more bleeding or prevent a miscarriage, your health care provider may recommend bed rest until you are advised otherwise. °· Avoid heavy lifting (more than 10 lb [4.5 kg]), exercise, sexual intercourse, or douching as directed by your health care provider. °· Keep track of the number of pads you use each day and how soaked (saturated) they are. Write down this information. °· Do not use tampons. °· Keep all follow-up appointments as directed by your health care provider. Your health care provider may ask you to have follow-up blood tests or ultrasound tests or both. °SEEK IMMEDIATE MEDICAL CARE IF: °· You have severe cramps in your stomach, back, abdomen, or pelvis. °· You have a fever. °· You pass large clots or tissue. Save any tissue for your health care provider to look at. °· Your bleeding increases or you become lightheaded, feel weak, or have fainting episodes. °Document Released: 11/27/2006 Document Revised: 12/27/2013 Document Reviewed: 03/11/2013 °ExitCare® Patient Information ©2015 [employer]. This information is not intended to replace advice given to you by your health care provider.   Make sure you discuss any questions you have with your health care provider. ° °

## 2014-06-15 NOTE — MAU Provider Note (Signed)
History     CSN: [id number]  Arrival date and time: 06/15/14 [id number]   First Provider Initiated Contact with Patient 06/15/14 1921      Chief Complaint  Patient presents with  . Vaginal Bleeding   Vaginal Bleeding    [name] [name] is a 23 y.o. [id number] at 3142w2d who presents today with spotting. She states that about a week ago she had an episode of spotting. She wasn't concerned about it at that time. However, she had another episode today, and came in for evaluation. She denies any pain or vaginal discharge or pain with urination. She has not established care at this time. She has seen Dr. Tamela OddiJackson-Moore in the past.   Past Medical History  Diagnosis Date  . Eczema   . Infection     chlamydia  . Trichomonas   . Chlamydia     Past Surgical History  Procedure Laterality Date  . Wisdom tooth extraction    . Fractured finger as a child      Family History  Problem Relation Age of Onset  . Other Neg Hx   . Asthma Sister   . Heart disease Maternal Grandmother     History  Substance Use Topics  . Smoking status: Never Smoker   . Smokeless tobacco: Never Used  . Alcohol Use: No    Allergies: No Known Allergies  Prescriptions prior to admission  Medication Sig Dispense Refill  . ferrous sulfate 325 (65 FE) MG tablet Take 325 mg by mouth 3 (three) times daily with meals.        Review of Systems  Genitourinary: Positive for vaginal bleeding.   Physical Exam   Blood pressure 130/75, pulse 95, temperature 98.3 F (36.8 C), temperature source Oral, resp. rate 18, height 5\' 2"  (1.575 m), weight 81.647 kg (180 lb), last menstrual period 04/18/2014, not currently breastfeeding.  Physical Exam  Nursing note and vitals reviewed. Constitutional: She is oriented to person, place, and time. She appears well-developed and well-nourished. No distress.  Cardiovascular: Normal rate.   Respiratory: Effort normal.  GI: Soft. There is no tenderness.  Neurological: She is  alert and oriented to person, place, and time.  Skin: Skin is warm and dry.  Psychiatric: She has a normal mood and affect.    MAU Course  Procedures Results for orders placed during the hospital encounter of 06/15/14 (from the past 24 hour(s))  CBC     Status: Abnormal   Collection Time    06/15/14  6:53 PM      Result Value Ref Range   WBC 7.9  4.0 - 10.5 K/uL   RBC 3.83 (*) 3.87 - 5.11 MIL/uL   Hemoglobin 11.8 (*) 12.0 - 15.0 g/dL   HCT 65.734.4 (*) 84.636.0 - 96.246.0 %   MCV 89.8  78.0 - 100.0 fL   MCH 30.8  26.0 - 34.0 pg   MCHC 34.3  30.0 - 36.0 g/dL   RDW 95.213.4  84.111.5 - 32.415.5 %   Platelets 239  150 - 400 K/uL  HCG, QUANTITATIVE, PREGNANCY     Status: Abnormal   Collection Time    06/15/14  6:54 PM      Result Value Ref Range   hCG, Beta Chain, [place] LongestQuant, S [id number] (*) <5 mIU/mL  WET PREP, GENITAL     Status: Abnormal   Collection Time    06/15/14  7:35 PM      Result Value Ref Range   Yeast Wet Prep HPF POC  NONE SEEN  NONE SEEN   Trich, Wet Prep NONE SEEN  NONE SEEN   Clue Cells Wet Prep HPF POC FEW (*) NONE SEEN   WBC, Wet Prep HPF POC MANY (*) NONE SEEN   [place] Ob Comp Less 14 Wks  06/15/2014   CLINICAL DATA:  Vaginal bleeding for 1 day. Estimated gestational age by LMP is 8 weeks 2 days. Quantitative beta HCG is 103,866  EXAM: OBSTETRIC <14 WK US AND TRANSVAGINAL OB US  TECHNIQUE: Both transabdominal and transvaginal ultrasound examinations were performed for complete evaluation of the gestation as well as the maternal uterus, adnexal regions, and pelvic cul-de-sac. Transvaginal technique was performed to assess early pregnancy.  COMPARISON:  None.  FINDINGS: Intrauterine gestational sac: A single intrauterine gestational sac is visualized.  Yolk sac:  Yolk sac is present.  Embryo:  Fetal pole is present.  Cardiac Activity: Fetal cardiac activity is observed.  Heart Rate:  144 bpm  CRL:   17.1  mm   8 w 2 d                  US EDC: 01/23/2015  Maternal uterus/adnexae: A moderate-sized  subchorionic hemorrhage is demonstrated anteriorly. Uterus is anteverted. Limited visualization of the adnexal regions. Left ovary is visualized with corpus luteum cyst present. Right ovary is not visualized. No free pelvic fluid collections.  IMPRESSION: Single intrauterine pregnancy. Estimated gestational age by crown-rump length is 8 weeks 2 days. Moderate-sized subchorionic hemorrhage demonstrated.   Electronically Signed   By: Burman NievesWilliam  Stevens M.D.   On: 06/15/2014 21:19   [place] Ob Transvaginal  06/15/2014   CLINICAL DATA:  Vaginal bleeding for 1 day. Estimated gestational age by LMP is 8 weeks 2 days. Quantitative beta HCG is 103,866  EXAM: OBSTETRIC <14 WK US AND TRANSVAGINAL OB US  TECHNIQUE: Both transabdominal and transvaginal ultrasound examinations were performed for complete evaluation of the gestation as well as the maternal uterus, adnexal regions, and pelvic cul-de-sac. Transvaginal technique was performed to assess early pregnancy.  COMPARISON:  None.  FINDINGS: Intrauterine gestational sac: A single intrauterine gestational sac is visualized.  Yolk sac:  Yolk sac is present.  Embryo:  Fetal pole is present.  Cardiac Activity: Fetal cardiac activity is observed.  Heart Rate:  144 bpm  CRL:   17.1  mm   8 w 2 d                  US EDC: 01/23/2015  Maternal uterus/adnexae: A moderate-sized subchorionic hemorrhage is demonstrated anteriorly. Uterus is anteverted. Limited visualization of the adnexal regions. Left ovary is visualized with corpus luteum cyst present. Right ovary is not visualized. No free pelvic fluid collections.  IMPRESSION: Single intrauterine pregnancy. Estimated gestational age by crown-rump length is 8 weeks 2 days. Moderate-sized subchorionic hemorrhage demonstrated.   Electronically Signed   By: Burman NievesWilliam  Stevens M.D.   On: 06/15/2014 21:19   Assessment and Plan   1. Spotting during pregnancy in first trimester   2. Subchorionic hematoma in first trimester, antepartum     Bleeding precautions reviewed Pelvic rest Return to MAU as needed  Follow-up Information   Schedule an appointment as soon as possible for a [facility].   Specialty:  Obstetrics and Gynecology   Contact information:   [address], [address] [place] [address] [phone]       [name] CrookHogan, Saranya Harlin Donovan 06/15/2014, 7:22 PM

## 2014-06-15 NOTE — MAU Note (Signed)
Noted blood when wiped after using restroom about an hour ago. No pain.  Hx of spotting a wk ago

## 2014-06-16 LAB — GC/CHLAMYDIA PROBE AMP
CT Probe RNA: POSITIVE — AB
GC Probe RNA: POSITIVE — AB

## 2014-06-16 LAB — HIV ANTIBODY (ROUTINE TESTING W REFLEX): HIV: NONREACTIVE

## 2014-06-17 ENCOUNTER — Ambulatory Visit (INDEPENDENT_AMBULATORY_CARE_PROVIDER_SITE_OTHER): Payer: Medicaid Other

## 2014-06-17 VITALS — BP 137/67 | HR 83 | Wt 179.3 lb

## 2014-06-17 DIAGNOSIS — A549 Gonococcal infection, unspecified: Secondary | ICD-10-CM

## 2014-06-17 DIAGNOSIS — A749 Chlamydial infection, unspecified: Secondary | ICD-10-CM

## 2014-06-17 MED ORDER — CEFTRIAXONE SODIUM 1 G IJ SOLR
250.0000 mg | Freq: Once | INTRAMUSCULAR | Status: AC
  Administered 2014-06-17: 250 mg via INTRAMUSCULAR

## 2014-06-17 MED ORDER — AZITHROMYCIN 250 MG PO TABS
1000.0000 mg | ORAL_TABLET | Freq: Once | ORAL | Status: AC
  Administered 2014-06-17: 1000 mg via ORAL

## 2014-06-18 ENCOUNTER — Telehealth: Payer: Self-pay | Admitting: Advanced Practice Midwife

## 2014-06-18 NOTE — Telephone Encounter (Signed)
LM for pt to return call about lab results. Pt positive for gonorrhea and chlamydia on 10/21.  Pt returned call and reports she was notified and treated at health department yesterday.

## 2014-06-27 ENCOUNTER — Encounter (HOSPITAL_COMMUNITY): Payer: Self-pay | Admitting: *Deleted

## 2014-08-22 ENCOUNTER — Encounter: Payer: Self-pay | Admitting: *Deleted

## 2014-08-23 ENCOUNTER — Encounter: Payer: Self-pay | Admitting: Obstetrics & Gynecology

## 2014-08-25 ENCOUNTER — Other Ambulatory Visit (HOSPITAL_COMMUNITY): Payer: Self-pay | Admitting: Nurse Practitioner

## 2014-08-25 DIAGNOSIS — Z3689 Encounter for other specified antenatal screening: Secondary | ICD-10-CM

## 2014-08-25 LAB — OB RESULTS CONSOLE HEPATITIS B SURFACE ANTIGEN: HEP B S AG: NEGATIVE

## 2014-08-25 LAB — OB RESULTS CONSOLE RUBELLA ANTIBODY, IGM: Rubella: IMMUNE

## 2014-08-26 NOTE — L&D Delivery Note (Signed)
Delivery Note At 11:44 PM a viable female was delivered via Vaginal, Spontaneous Delivery (Presentation: ; Occiput Anterior).  APGAR: 8, 9; weight (pending).   Placenta status: Intact, Spontaneous.  Cord: 3v with the following complications: None.  Anesthesia: Epidural  Episiotomy: None Lacerations: Superficial perineal Suture Repair: None Est. Blood Loss (mL): 200cc  Mom to postpartum.  Baby to Couplet care / Skin to Skin.  Upon arrival to room patient was complete and ready to push, had received epidural about 1 hr prior still with discomfort. She pushed with good maternal effort to deliver a healthy baby boy, delivered without significant difficulty, some decreased maternal effort with pushing due to pain after delivery of head but continued good progress. Loose nuchal x 1, reduced following delivery, good tone and place on maternal abdomen for oral suctioning, drying and stimulation. Delayed cord clamping performed and cut by Family. Placenta delivered intact with 3V cord. Vaginal canal and perineum was inspected and with superficial perineal laceration, hemostatic without req suture. Pitocin was started and uterus massaged until bleeding slowed. Counts of sharps, instruments, and lap pads were all correct.    Saralyn PilarAlexander Karamalegos, DO [facility], PGY-2 01/24/2015, 12:10 AM   Patient is a [id number] at 9138w0d who was admitted w/ PROM and uncomplicated prenatal course.  She progressed with augmentation via Pitocin.  I was gloved and present for delivery in its entirety.  Second stage of labor progressed, baby delivered after 3-4 contractions.  Mild variable decels during second stage noted.  Complications: none  Lacerations: superficial perineal    Zykerria Tanton, CNM 12:42 AM

## 2014-09-01 ENCOUNTER — Ambulatory Visit (HOSPITAL_COMMUNITY)
Admission: RE | Admit: 2014-09-01 | Discharge: 2014-09-01 | Disposition: A | Discharge status: Home / Self Care | Payer: Medicaid Other | Source: Ambulatory Visit | Attending: Nurse Practitioner | Admitting: Nurse Practitioner

## 2014-09-01 DIAGNOSIS — Z36 Encounter for antenatal screening of mother: Secondary | ICD-10-CM | POA: Insufficient documentation

## 2014-09-01 DIAGNOSIS — Z3689 Encounter for other specified antenatal screening: Secondary | ICD-10-CM | POA: Insufficient documentation

## 2014-09-01 DIAGNOSIS — Z3A19 19 weeks gestation of pregnancy: Secondary | ICD-10-CM | POA: Insufficient documentation

## 2014-10-04 ENCOUNTER — Inpatient Hospital Stay (HOSPITAL_COMMUNITY)
Admission: AD | Admit: 2014-10-04 | Discharge: 2014-10-04 | Disposition: A | Discharge status: Home / Self Care | Payer: Medicaid Other | Source: Ambulatory Visit | Attending: Family Medicine | Admitting: Family Medicine

## 2014-10-04 ENCOUNTER — Encounter (HOSPITAL_COMMUNITY): Payer: Self-pay | Admitting: *Deleted

## 2014-10-04 DIAGNOSIS — O98312 Other infections with a predominantly sexual mode of transmission complicating pregnancy, second trimester: Secondary | ICD-10-CM | POA: Diagnosis not present

## 2014-10-04 DIAGNOSIS — Z3A24 24 weeks gestation of pregnancy: Secondary | ICD-10-CM

## 2014-10-04 DIAGNOSIS — O4692 Antepartum hemorrhage, unspecified, second trimester: Secondary | ICD-10-CM

## 2014-10-04 DIAGNOSIS — A5602 Chlamydial vulvovaginitis: Secondary | ICD-10-CM | POA: Insufficient documentation

## 2014-10-04 DIAGNOSIS — O26852 Spotting complicating pregnancy, second trimester: Secondary | ICD-10-CM | POA: Diagnosis present

## 2014-10-04 LAB — URINALYSIS, ROUTINE W REFLEX MICROSCOPIC
Bilirubin Urine: NEGATIVE
Glucose, UA: NEGATIVE mg/dL
KETONES UR: NEGATIVE mg/dL
NITRITE: NEGATIVE
PH: 6.5 (ref 5.0–8.0)
Protein, ur: NEGATIVE mg/dL
SPECIFIC GRAVITY, URINE: 1.025 (ref 1.005–1.030)
UROBILINOGEN UA: 0.2 mg/dL (ref 0.0–1.0)

## 2014-10-04 LAB — WET PREP, GENITAL
Clue Cells Wet Prep HPF POC: NONE SEEN
Trich, Wet Prep: NONE SEEN
Yeast Wet Prep HPF POC: NONE SEEN

## 2014-10-04 LAB — URINE MICROSCOPIC-ADD ON

## 2014-10-04 NOTE — MAU Note (Signed)
Urine in lab 

## 2014-10-04 NOTE — MAU Note (Signed)
P 

## 2014-10-04 NOTE — Discharge Instructions (Signed)
Vaginal Bleeding During Pregnancy, Second Trimester °A small amount of bleeding (spotting) from the vagina is relatively common in pregnancy. It usually stops on its own. Various things can cause bleeding or spotting in pregnancy. Some bleeding may be related to the pregnancy, and some may not. Sometimes the bleeding is normal and is not a problem. However, bleeding can also be a sign of something serious. Be sure to tell your health care provider about any vaginal bleeding right away. °Some possible causes of vaginal bleeding during the second trimester include: °· Infection, inflammation, or growths on the cervix.   °· The placenta may be partially or completely covering the opening of the cervix inside the uterus (placenta previa). °· The placenta may have separated from the uterus (abruption of the placenta).   °· You may be having early (preterm) labor.   °· The cervix may not be strong enough to keep a baby inside the uterus (cervical insufficiency).   °· Tiny cysts may have developed in the uterus instead of pregnancy tissue (molar pregnancy).  °HOME CARE INSTRUCTIONS  °Watch your condition for any changes. The following actions may help to lessen any discomfort you are feeling: °· Follow your health care provider's instructions for limiting your activity. If your health care provider orders bed rest, you may need to stay in bed and only get up to use the bathroom. However, your health care provider may allow you to continue light activity. °· If needed, make plans for someone to help with your regular activities and responsibilities while you are on bed rest. °· Keep track of the number of pads you use each day, how often you change pads, and how soaked (saturated) they are. Write this down. °· Do not use tampons. Do not douche. °· Do not have sexual intercourse or orgasms until approved by your health care provider. °· If you pass any tissue from your vagina, save the tissue so you can show it to your  health care provider. °· Only take over-the-counter or prescription medicines as directed by your health care provider. °· Do not take aspirin because it can make you bleed. °· Do not exercise or perform any strenuous activities or heavy lifting without your health care provider's permission. °· Keep all follow-up appointments as directed by your health care provider. °SEEK MEDICAL CARE IF: °· You have any vaginal bleeding during any part of your pregnancy. °· You have cramps or labor pains. °· You have a fever, not controlled by medicine. °SEEK IMMEDIATE MEDICAL CARE IF:  °· You have severe cramps in your back or belly (abdomen). °· You have contractions. °· You have chills. °· You pass large clots or tissue from your vagina. °· Your bleeding increases. °· You feel light-headed or weak, or you have fainting episodes. °· You are leaking fluid or have a gush of fluid from your vagina. °MAKE SURE YOU: °· Understand these instructions. °· Will watch your condition. °· Will get help right away if you are not doing well or get worse. °Document Released: 05/22/2005 Document Revised: 08/17/2013 Document Reviewed: 04/19/2013 °ExitCare® Patient Information ©2015 [employer]. This information is not intended to replace advice given to you by your health care provider. Make sure you discuss any questions you have with your health care provider. ° °

## 2014-10-04 NOTE — Progress Notes (Signed)
Pt states she has spotting now but at first it was red when she used the toilet.x 1. Pt states she wiped and it was there very faint

## 2014-10-04 NOTE — MAU Note (Signed)
Pt states she noticed spotting to day

## 2014-10-05 LAB — GC/CHLAMYDIA PROBE AMP (~~LOC~~) NOT AT ARMC
CHLAMYDIA, DNA PROBE: POSITIVE — AB
Neisseria Gonorrhea: NEGATIVE

## 2014-10-05 NOTE — MAU Provider Note (Signed)
  History     CSN: [id number]  Arrival date and time: 10/04/14 1719   None     Chief Complaint  Patient presents with  . Vaginal Bleeding   HPI  Patient is 24 y.o. [id number] 8234w1d here with complaints of vaginal spotting earlier today, none currently.  Previous + G/C and has since been treated, no TOC yet.  No recent intercourse.  +FM, denies LOF, contractions, vaginal discharge.     Past Medical History  Diagnosis Date  . Eczema   . Infection     chlamydia  . Trichomonas   . Chlamydia     Past Surgical History  Procedure Laterality Date  . Wisdom tooth extraction    . Fractured finger as a child      Family History  Problem Relation Age of Onset  . Other Neg Hx   . Asthma Sister   . Heart disease Maternal Grandmother     History  Substance Use Topics  . Smoking status: Never Smoker   . Smokeless tobacco: Never Used  . Alcohol Use: No    Allergies: No Known Allergies  No prescriptions prior to admission    Review of Systems  Constitutional: Negative for fever and chills.  Respiratory: Negative for cough and shortness of breath.   Gastrointestinal: Negative for heartburn, nausea, vomiting and diarrhea.  Genitourinary: Negative for dysuria, urgency, frequency and hematuria.  Neurological:       No headache   Physical Exam   Blood pressure 119/62, pulse 96, temperature 97.7 F (36.5 C), temperature source Oral, resp. rate 16, last menstrual period 04/18/2014, not currently breastfeeding.  Physical Exam  Constitutional: She is oriented to person, place, and time. She appears well-developed and well-nourished.  HENT:  Head: Normocephalic and atraumatic.  Eyes: Conjunctivae and EOM are normal.  Neck: Normal range of motion.  Cardiovascular: Normal rate.   Respiratory: Effort normal. No respiratory distress.  GI: Soft. She exhibits no distension. There is no tenderness.  Genitourinary:  SPE: cervix friable, no blood in vault   Musculoskeletal:  Normal range of motion. She exhibits no edema.  Neurological: She is alert and oriented to person, place, and time.  Skin: Skin is warm and dry. No erythema.    MAU Course  Procedures  MDM Repeat G/C  Results for [name] [name], [name] [name] (MRN [id number]) as of 10/07/2014 09:16  10/04/2014 17:27  APPearance HAZY (A)  Specific Gravity, Urine 1.025  pH 6.5  Glucose NEGATIVE  Bilirubin Urine NEGATIVE  Ketones, ur NEGATIVE  Protein NEGATIVE  Urobilinogen, UA 0.2  Nitrite NEGATIVE  Leukocytes, UA LARGE (A)  Hgb urine dipstick LARGE (A)  Urine-Other AMORPHOUS URATES/...  WBC, UA 7-10  Squamous Epithelial / LPF FEW (A)  Crystals CA OXALATE CRYSTALS (A)    Assessment and Plan  Patient is 24 y.o. [id number] 2334w1d reporting vaginal bleeding likely secondary to friable cervix - fetal kick counts reinforced - preterm labor precautions - repeat G/C ==> was positive Chlamydia, Marni to notify patient   Loyalty Brashier ROCIO 10/05/2014, 8:53 PM

## 2014-12-13 ENCOUNTER — Encounter (HOSPITAL_COMMUNITY): Payer: Self-pay | Admitting: *Deleted

## 2014-12-13 ENCOUNTER — Observation Stay (HOSPITAL_COMMUNITY)
Admission: AD | Admit: 2014-12-13 | Discharge: 2014-12-14 | Disposition: A | Discharge status: Home / Self Care | Payer: Medicaid Other | Source: Ambulatory Visit | Attending: Family Medicine | Admitting: Family Medicine

## 2014-12-13 ENCOUNTER — Inpatient Hospital Stay (HOSPITAL_COMMUNITY): Payer: Medicaid Other

## 2014-12-13 DIAGNOSIS — O368390 Maternal care for abnormalities of the fetal heart rate or rhythm, unspecified trimester, not applicable or unspecified: Secondary | ICD-10-CM

## 2014-12-13 DIAGNOSIS — O9989 Other specified diseases and conditions complicating pregnancy, childbirth and the puerperium: Secondary | ICD-10-CM | POA: Insufficient documentation

## 2014-12-13 DIAGNOSIS — Z872 Personal history of diseases of the skin and subcutaneous tissue: Secondary | ICD-10-CM | POA: Diagnosis not present

## 2014-12-13 DIAGNOSIS — Z3A34 34 weeks gestation of pregnancy: Secondary | ICD-10-CM | POA: Diagnosis not present

## 2014-12-13 DIAGNOSIS — R42 Dizziness and giddiness: Secondary | ICD-10-CM | POA: Insufficient documentation

## 2014-12-13 DIAGNOSIS — O212 Late vomiting of pregnancy: Secondary | ICD-10-CM | POA: Diagnosis present

## 2014-12-13 DIAGNOSIS — Z8619 Personal history of other infectious and parasitic diseases: Secondary | ICD-10-CM | POA: Diagnosis not present

## 2014-12-13 DIAGNOSIS — IMO0002 Reserved for concepts with insufficient information to code with codable children: Secondary | ICD-10-CM | POA: Insufficient documentation

## 2014-12-13 HISTORY — DX: Gonococcal infection, unspecified: A54.9

## 2014-12-13 LAB — RAPID URINE DRUG SCREEN, HOSP PERFORMED
Amphetamines: NOT DETECTED
BARBITURATES: NOT DETECTED
BENZODIAZEPINES: NOT DETECTED
COCAINE: NOT DETECTED
Opiates: NOT DETECTED
TETRAHYDROCANNABINOL: NOT DETECTED

## 2014-12-13 LAB — URINALYSIS, ROUTINE W REFLEX MICROSCOPIC
Bilirubin Urine: NEGATIVE
Glucose, UA: NEGATIVE mg/dL
Ketones, ur: >80 mg/dL — AB
LEUKOCYTES UA: NEGATIVE
Nitrite: NEGATIVE
PROTEIN: 30 mg/dL — AB
UROBILINOGEN UA: 0.2 mg/dL (ref 0.0–1.0)
pH: 5.5 (ref 5.0–8.0)

## 2014-12-13 LAB — WET PREP, GENITAL
Trich, Wet Prep: NONE SEEN
Yeast Wet Prep HPF POC: NONE SEEN

## 2014-12-13 LAB — URINE MICROSCOPIC-ADD ON

## 2014-12-13 MED ORDER — SODIUM CHLORIDE 0.9 % IV SOLN
Freq: Once | INTRAVENOUS | Status: AC
  Administered 2014-12-13: 23:00:00 via INTRAVENOUS

## 2014-12-13 MED ORDER — SODIUM CHLORIDE 0.9 % IV SOLN
Freq: Once | INTRAVENOUS | Status: AC
  Administered 2014-12-13: 20:00:00 via INTRAVENOUS

## 2014-12-13 MED ORDER — METOCLOPRAMIDE HCL 5 MG/ML IJ SOLN
10.0000 mg | Freq: Once | INTRAMUSCULAR | Status: AC
  Administered 2014-12-13: 10 mg via INTRAVENOUS
  Filled 2014-12-13: qty 2

## 2014-12-13 MED ORDER — CEFTRIAXONE SODIUM 250 MG IJ SOLR
250.0000 mg | Freq: Once | INTRAMUSCULAR | Status: AC
  Administered 2014-12-13: 250 mg via INTRAMUSCULAR
  Filled 2014-12-13: qty 250

## 2014-12-13 MED ORDER — SODIUM CHLORIDE 0.9 % IV SOLN
25.0000 mg | Freq: Once | INTRAVENOUS | Status: DC
  Filled 2014-12-13: qty 1

## 2014-12-13 MED ORDER — ONDANSETRON HCL 4 MG/2ML IJ SOLN
4.0000 mg | Freq: Once | INTRAMUSCULAR | Status: AC
  Administered 2014-12-13: 4 mg via INTRAVENOUS
  Filled 2014-12-13: qty 2

## 2014-12-13 MED ORDER — AZITHROMYCIN 250 MG PO TABS
1000.0000 mg | ORAL_TABLET | Freq: Once | ORAL | Status: AC
  Administered 2014-12-13: 1000 mg via ORAL
  Filled 2014-12-13: qty 4

## 2014-12-13 NOTE — H&P (Signed)
[name] [name] is a 24 y.o. female presenting for nausea and vomiting.  Received reglan, 1L NS with continued PO intolerance.  Given  zofran and additional fluids.  Still vomited.  Had late deceleration down to 90s.  BPP 6/8.  Patient also acting bizarrely, ignoring staff.  Had to undergo in-out cath to obtain UA/UDS.  Then hours later, RN asked if patient needed to go to bathroom and reported "No, the catheter is working just fine," stating that she still had catheter in place when she in fact did not.   Maternal Medical History:  Reason for admission: Nausea.     OB History    Gravida Para Term Preterm AB TAB SAB Ectopic Multiple Living   Past Medical History  Diagnosis Date  . Eczema   . Infection     chlamydia  . Trichomonas   . Chlamydia   . Gonorrhea    Past Surgical History  Procedure Laterality Date  . Wisdom tooth extraction    . Fractured finger as a child     Family History: family history includes Asthma in her sister; Heart disease in her maternal grandmother. There is no history of Other. Social History:  reports that she has never smoked. She has never used smokeless tobacco. She reports that she does not drink alcohol or use illicit drugs.   Prenatal Transfer Tool  Maternal Diabetes: No Genetic Screening: Declined Maternal Ultrasounds/Referrals: Normal Fetal Ultrasounds or other Referrals:  None Maternal Substance Abuse:  No Significant Maternal Medications:  None Significant Maternal Lab Results:  None Other Comments:  None  Review of Systems  Constitutional: Negative for fever and chills.  HENT: Negative for congestion.   Respiratory: Negative for cough and shortness of breath.   Cardiovascular: Negative for chest pain and leg swelling.  Gastrointestinal: Positive for nausea and vomiting. Negative for heartburn and diarrhea.  Genitourinary: Negative for dysuria, urgency, frequency and hematuria.  Skin: Negative for  itching and rash.  Neurological: Positive for dizziness. Negative for loss of consciousness and headaches.      Blood pressure 124/65, pulse 89, temperature 98.3 F (36.8 C), temperature source Oral, resp. rate 18, last menstrual period 04/18/2014, SpO2 100 %, not currently breastfeeding. Exam Physical Exam  Constitutional: She is oriented to person, place, and time. She appears well-developed and well-nourished.  HENT:  Head: Normocephalic and atraumatic.  Eyes: Conjunctivae and EOM are normal.  Neck: Normal range of motion.  Cardiovascular: Normal rate.   Respiratory: Effort normal. No respiratory distress.  GI: Soft. She exhibits no distension. There is no tenderness.  Musculoskeletal: Normal range of motion. She exhibits no edema.  Neurological: She is alert and oriented to person, place, and time.  Skin: Skin is warm and dry. No erythema.    Prenatal labs: ABO, Rh:    A pos Antibody:    Neg Rubella:    Immune RPR: NON REAC (05/20 0315)  HBsAg:   neg HIV: NONREACTIVE (10/21 1853)  GBS:     Assessment/Plan: 81XB [id number] here at [redacted]w[redacted]d with nausea/vomiting, PO intolerance, and late deceleration while in MAU   N/V: prn zofran, reglan Late deceleration: continuous monitoring BPP 6/8: repeat BPP tomorrow 1h gtt: has not had yet, will post-pone 1h until adequately tolerating PO Vaginal discharge: consistent with bacterial vaginosis.   => Received rocephin , did not tolerate  azithromycin.  Was empirically ordered initially 2/2 suspected noncompliance and large  amount of vaginal discharge noted on blind G/C. - repeat g/c ordered - if chlamydia positive will need tx - metrogel for BV  [name] [name] 12/13/2014, 11:55 PM

## 2014-12-13 NOTE — MAU Note (Signed)
Arrived via EMS. EMS reported patient states she has been vomiting today. Unable to keep food down. C/O weakness. Patient states she is unable to void for urine. Patient has received minimal prenatal care. States she has no child care and can not take her baby to health dept for appointments so she just stopped going.

## 2014-12-13 NOTE — MAU Provider Note (Signed)
Care taken over at 2000.   #hx of chlamydia, reports having medication called in for her by HD/WOC(was unsure who called it), states she did take it.  - Repeat g/c today #N/V: improved s/p reglan, still has fluids going.  8:45PM  #N/V: reports feeling better however reported episode of emesis after crackers/gingerale (not witnessed)  - bolus 500mL  - zofran 4mg  IV #hx of chlamydia: RN did blind G/C and reported significant amount of discharge.  Will treat empirically for G/C given hx of poor follow up and chlamydiax2  9:31 PM Elizabethanne Lusher ROCIO, MD   Patient subsequently admitted for BPP 8/10 with late deceleration for 24h obs and continous monitoring.  Please see H&P for further documentation  Perry MountACOSTA,Skip Litke ROCIO, MD

## 2014-12-13 NOTE — MAU Note (Addendum)
FAMILY  IN ROOM   -  UNABLE  TO TAKE ALL HER MEDS-    SWALLOWED 1-   VOMITED 1,   1 IN PT 'S  HAND - THREW IN TRASH  ,    RETURNED  2 TO [facility]

## 2014-12-13 NOTE — MAU Provider Note (Signed)
  History     CSN: [id number]  Arrival date and time: 12/13/14 1805   First Provider Initiated Contact with Patient 12/13/14 1837      Chief Complaint  Patient presents with  . Emesis   HPI  [name] is a 24 y.o. [id number] at 227w1d here with report of vomiting that started this AM.  Reports vomiting "a lot".  Unable to give a number.   Reports not able to hold down anything PO.  Denies fever or body aches.  +chills started this morning.  No recent exposure to ill individuals.    [facility] however stopped due to not being able to take children to visit.  Reports a normal prenatal course.    Past Medical History  Diagnosis Date  . Eczema   . Infection     chlamydia  . Trichomonas   . Chlamydia     Past Surgical History  Procedure Laterality Date  . Wisdom tooth extraction    . Fractured finger as a child      Family History  Problem Relation Age of Onset  . Other Neg Hx   . Asthma Sister   . Heart disease Maternal Grandmother     History  Substance Use Topics  . Smoking status: Never Smoker   . Smokeless tobacco: Never Used  . Alcohol Use: No    Allergies: No Known Allergies  No prescriptions prior to admission    Review of Systems  Constitutional: Positive for chills and malaise/fatigue. Negative for fever.  Gastrointestinal: Positive for nausea and vomiting. Negative for abdominal pain and diarrhea.  Genitourinary: Negative for dysuria, urgency and frequency.  Neurological: Positive for dizziness. Negative for headaches.  All other systems reviewed and are negative.  Physical Exam   Blood pressure 124/65, pulse 90, temperature 98.3 F (36.8 C), temperature source Oral, resp. rate 18, last menstrual period 04/18/2014, SpO2 99 %, not currently breastfeeding.  Physical Exam  Constitutional: She is oriented to person, place, and time. She appears well-developed and well-nourished.  No distress.  HENT:  Head: Normocephalic.  Neck: Normal range of motion. Neck supple.  Cardiovascular: Normal rate, regular rhythm and normal heart sounds.   Respiratory: Effort normal and breath sounds normal.  GI: Soft. There is no tenderness.  Genitourinary: No bleeding in the vagina.  Neurological: She is oriented to person, place, and time.  Skin: Skin is warm and dry.  Psychiatric:  Appears drowsy; responds to verbal stimulus    MAU Course  Procedures  2010 Report given to K. Loreta Avecosta who assumes care of patient.   Eino FarberWalidah Kennith GainN Karim, CNM   Assessment and Plan

## 2014-12-13 NOTE — Progress Notes (Addendum)
Patient lethargic, no eye contract. Difficult to assess. Will not lie in position conducive to monitoring. Will not follow instructions. Encouraged patient to lie where baby and contractions could be assessed. Patient make no response and will not turn back as requested. Currently monitoring MHR with flat UC line. Informed patient of importance in monitoring to assess wellbeing of her baby. Contractions noted prior to patient turning.

## 2014-12-13 NOTE — MAU Note (Signed)
SAYS FEELS  BETTER-  SO GAVE SPRITE AND GRAM CRACKERS

## 2014-12-14 ENCOUNTER — Observation Stay (HOSPITAL_COMMUNITY): Payer: Medicaid Other

## 2014-12-14 ENCOUNTER — Encounter (HOSPITAL_COMMUNITY): Payer: Self-pay | Admitting: *Deleted

## 2014-12-14 DIAGNOSIS — A749 Chlamydial infection, unspecified: Secondary | ICD-10-CM

## 2014-12-14 DIAGNOSIS — Z3A34 34 weeks gestation of pregnancy: Secondary | ICD-10-CM | POA: Insufficient documentation

## 2014-12-14 DIAGNOSIS — O98313 Other infections with a predominantly sexual mode of transmission complicating pregnancy, third trimester: Secondary | ICD-10-CM

## 2014-12-14 DIAGNOSIS — IMO0002 Reserved for concepts with insufficient information to code with codable children: Secondary | ICD-10-CM | POA: Insufficient documentation

## 2014-12-14 DIAGNOSIS — O368390 Maternal care for abnormalities of the fetal heart rate or rhythm, unspecified trimester, not applicable or unspecified: Secondary | ICD-10-CM | POA: Insufficient documentation

## 2014-12-14 DIAGNOSIS — R1110 Vomiting, unspecified: Secondary | ICD-10-CM

## 2014-12-14 LAB — GC/CHLAMYDIA PROBE AMP (~~LOC~~) NOT AT ARMC
Chlamydia: NEGATIVE
Neisseria Gonorrhea: NEGATIVE

## 2014-12-14 MED ORDER — METOCLOPRAMIDE HCL 5 MG/ML IJ SOLN: 10.0000 mg | Freq: Three times a day (TID) | INTRAMUSCULAR | Status: DC | PRN

## 2014-12-14 MED ORDER — METRONIDAZOLE 0.75 % VA GEL
1.0000 | Freq: Two times a day (BID) | VAGINAL | Status: DC
  Administered 2014-12-14 (×2): 1 via VAGINAL
  Filled 2014-12-14: qty 70

## 2014-12-14 MED ORDER — DOCUSATE SODIUM 100 MG PO CAPS: 100.0000 mg | ORAL_CAPSULE | Freq: Every day | ORAL | Status: DC

## 2014-12-14 MED ORDER — ACETAMINOPHEN 325 MG PO TABS: 650.0000 mg | ORAL_TABLET | ORAL | Status: DC | PRN

## 2014-12-14 MED ORDER — CALCIUM CARBONATE ANTACID 500 MG PO CHEW: 2.0000 | CHEWABLE_TABLET | ORAL | Status: DC | PRN

## 2014-12-14 MED ORDER — ZOLPIDEM TARTRATE 5 MG PO TABS: 5.0000 mg | ORAL_TABLET | Freq: Every evening | ORAL | Status: DC | PRN

## 2014-12-14 MED ORDER — METRONIDAZOLE 0.75 % VA GEL: 1.0000 | Freq: Two times a day (BID) | VAGINAL | Status: DC

## 2014-12-14 MED ORDER — PRENATAL MULTIVITAMIN CH: 1.0000 | ORAL_TABLET | Freq: Every day | ORAL | Status: DC

## 2014-12-14 MED ORDER — DEXTROSE 5 % IV SOLN
500.0000 mg | INTRAVENOUS | Status: AC
  Administered 2014-12-14 (×2): 500 mg via INTRAVENOUS
  Filled 2014-12-14 (×2): qty 500

## 2014-12-14 MED ORDER — PRENATAL MULTIVITAMIN CH
1.0000 | ORAL_TABLET | Freq: Every day | ORAL | Status: DC
Start: 2014-12-14 — End: 2015-01-11

## 2014-12-14 NOTE — Discharge Summary (Signed)
Antenatal Physician Discharge Summary  Patient ID: [name] [name] MRN: [id number] DOB/AGE: 11/03/1990 24 y.o.  Admit date: 12/13/2014 Discharge date: 12/14/2014  Admission Diagnoses: deceleration;  nausea and emessis  Discharge Diagnoses: same  Prenatal Procedures: BPP  Significant Diagnostic Studies:  Results for orders placed or performed during the hospital encounter of 12/13/14 (from the past 168 hour(s))  Urinalysis, Routine w reflex microscopic   Collection Time: 12/13/14  7:05 PM  Result Value Ref Range   Color, Urine YELLOW YELLOW   APPearance CLEAR CLEAR   Specific Gravity, Urine >1.030 (H) 1.005 - 1.030   pH 5.5 5.0 - 8.0   Glucose, UA NEGATIVE NEGATIVE mg/dL   Hgb urine dipstick SMALL (A) NEGATIVE   Bilirubin Urine NEGATIVE NEGATIVE   Ketones, ur >80 (A) NEGATIVE mg/dL   Protein, ur 30 (A) NEGATIVE mg/dL   Urobilinogen, UA 0.2 0.0 - 1.0 mg/dL   Nitrite NEGATIVE NEGATIVE   Leukocytes, UA NEGATIVE NEGATIVE  Urine rapid drug screen (hosp performed)   Collection Time: 12/13/14  7:05 PM  Result Value Ref Range   Opiates NONE DETECTED NONE DETECTED   Cocaine NONE DETECTED NONE DETECTED   Benzodiazepines NONE DETECTED NONE DETECTED   Amphetamines NONE DETECTED NONE DETECTED   Tetrahydrocannabinol NONE DETECTED NONE DETECTED   Barbiturates NONE DETECTED NONE DETECTED  Urine microscopic-add on   Collection Time: 12/13/14  7:05 PM  Result Value Ref Range   Squamous Epithelial / LPF RARE RARE   WBC, UA 3-6 <3 WBC/hpf   RBC / HPF 0-2 <3 RBC/hpf   Bacteria, UA FEW (A) RARE   Urine-Other MUCOUS PRESENT   Wet prep, genital   Collection Time: 12/13/14  9:25 PM  Result Value Ref Range   Yeast Wet Prep HPF POC NONE SEEN NONE SEEN   Trich, Wet Prep NONE SEEN NONE SEEN   Clue Cells Wet Prep HPF POC FEW (A) NONE SEEN   WBC, Wet Prep HPF POC MANY (A) NONE SEEN    Treatments: IV hydration  Hospital Course:  This is a 24 y.o. [id number] with IUP at 5361w2d admitted for  history of N/V with deceleration noted.  A BPP on yesterday was 6/8.  Pt was observed overnight and a repeat BPP was done this am which was 8/10 (2 off for breathing). Pt denies complaints this am.  No N/V.  She reports good FM.  She reports that [facility] but, that she could not keep her appt due to not being able to find childcare during her OB visits. She was observed, fetal heart rate monitoring remained reassuring, and she was deemed stable for discharge to home with outpatient follow up.  Discharge Exam: BP 106/56 mmHg  Pulse 94  Temp(Src) 98.4 F (36.9 C) (Oral)  Resp 18  Ht 5\' 4"  (1.626 m)  Wt 211 lb 4.8 oz (95.845 kg)  BMI 36.25 kg/m2  SpO2 100%  LMP 04/18/2014 General appearance: alert and no distress  Discharge Condition: good  Disposition: 01-Home or Self Care  Discharge Instructions    Discharge activity:  No Restrictions    Complete by:  As directed      Discharge diet:  No restrictions    Complete by:  As directed      No sexual activity restrictions    Complete by:  As directed      Notify physician for a general feeling that "something is not right"    Complete by:  As directed  Notify physician for increase or change in vaginal discharge    Complete by:  As directed      Notify physician for intestinal cramps, with or without diarrhea, sometimes described as "gas pain"    Complete by:  As directed      Notify physician for leaking of fluid    Complete by:  As directed      Notify physician for low, dull backache, unrelieved by heat or Tylenol    Complete by:  As directed      Notify physician for menstrual like cramps    Complete by:  As directed      Notify physician for pelvic pressure    Complete by:  As directed      Notify physician for uterine contractions.  These may be painless and feel like the uterus is tightening or the baby is  "balling up"    Complete by:  As directed      Notify physician for vaginal bleeding     Complete by:  As directed      PRETERM LABOR:  Includes any of the follwing symptoms that occur between 20 - [redacted] weeks gestation.  If these symptoms are not stopped, preterm labor can result in preterm delivery, placing your baby at risk    Complete by:  As directed             Medication List    TAKE these medications        metroNIDAZOLE 0.75 % vaginal gel  Commonly known as:  METROGEL  Place 1 Applicatorful vaginally 2 (two) times daily.     prenatal multivitamin Tabs tablet  Take 1 tablet by mouth daily at 12 noon.           Follow-up Information    Follow up with [facility]              In 1 week.   Contact information:   [employer]       Signed: Willodean Rosenthal M.D. 12/14/2014, 3:13 PM

## 2014-12-14 NOTE — Plan of Care (Signed)
Problem: Phase II Progression Outcomes Goal: Electronic fetal monitoring(Doppler,Continuous,Intermittent) EFM (Doppler, Continuous, Intermittent)  Outcome: Progressing Patient on continuous fetal monitoring. Goal: Labs/tests as ordered Labs/tests as ordered (Magnesium level, CBG's, CBC, CMET, 24 hr Urine, Amniocentesis, Ultrasound, Other)  Outcome: Progressing Patient had ultrasound done this am

## 2014-12-14 NOTE — Discharge Instructions (Signed)

## 2014-12-14 NOTE — Progress Notes (Signed)
Ur chart review completed.  

## 2014-12-23 ENCOUNTER — Ambulatory Visit (INDEPENDENT_AMBULATORY_CARE_PROVIDER_SITE_OTHER): Payer: Self-pay | Admitting: Advanced Practice Midwife

## 2014-12-23 ENCOUNTER — Encounter: Payer: Self-pay | Admitting: Advanced Practice Midwife

## 2014-12-23 VITALS — BP 125/64 | HR 100 | Wt 206.0 lb

## 2014-12-23 DIAGNOSIS — O09893 Supervision of other high risk pregnancies, third trimester: Secondary | ICD-10-CM

## 2014-12-23 DIAGNOSIS — O0933 Supervision of pregnancy with insufficient antenatal care, third trimester: Secondary | ICD-10-CM

## 2014-12-23 DIAGNOSIS — Z9119 Patient's noncompliance with other medical treatment and regimen: Secondary | ICD-10-CM

## 2014-12-23 DIAGNOSIS — O0930 Supervision of pregnancy with insufficient antenatal care, unspecified trimester: Secondary | ICD-10-CM | POA: Insufficient documentation

## 2014-12-23 DIAGNOSIS — R319 Hematuria, unspecified: Secondary | ICD-10-CM

## 2014-12-23 LAB — GLUCOSE, CAPILLARY: Glucose-Capillary: 88 mg/dL (ref 70–99)

## 2014-12-23 LAB — POCT URINALYSIS DIP (DEVICE)
Bilirubin Urine: NEGATIVE
Glucose, UA: NEGATIVE mg/dL
Ketones, ur: NEGATIVE mg/dL
Nitrite: NEGATIVE
Protein, ur: 30 mg/dL — AB
SPECIFIC GRAVITY, URINE: 1.02 (ref 1.005–1.030)
Urobilinogen, UA: 0.2 mg/dL (ref 0.0–1.0)
pH: 7 (ref 5.0–8.0)

## 2014-12-23 LAB — CBC
HCT: 29.6 % — ABNORMAL LOW (ref 36.0–46.0)
HEMOGLOBIN: 10 g/dL — AB (ref 12.0–15.0)
MCH: 26.8 pg (ref 26.0–34.0)
MCHC: 33.8 g/dL (ref 30.0–36.0)
MCV: 79.4 fL (ref 78.0–100.0)
Platelets: 183 10*3/uL (ref 150–400)
RBC: 3.73 MIL/uL — ABNORMAL LOW (ref 3.87–5.11)
RDW: 15.9 % — ABNORMAL HIGH (ref 11.5–15.5)
WBC: 7.6 10*3/uL (ref 4.0–10.5)

## 2014-12-23 LAB — HEMOGLOBIN A1C
Hgb A1c MFr Bld: 5.9 % — ABNORMAL HIGH (ref <5.7)
Mean Plasma Glucose: 123 mg/dL — ABNORMAL HIGH (ref <117)

## 2014-12-23 NOTE — Patient Instructions (Addendum)
Braxton Hicks Contractions Contractions of the uterus can occur throughout pregnancy. Contractions are not always a sign that you are in labor.  WHAT ARE BRAXTON HICKS CONTRACTIONS?  Contractions that occur before labor are called Braxton Hicks contractions, or false labor. Toward the end of pregnancy (32-34 weeks), these contractions can develop more often and may become more forceful. This is not true labor because these contractions do not result in opening (dilatation) and thinning of the cervix. They are sometimes difficult to tell apart from true labor because these contractions can be forceful and people have different pain tolerances. You should not feel embarrassed if you go to the hospital with false labor. Sometimes, the only way to tell if you are in true labor is for your health care provider to look for changes in the cervix. If there are no prenatal problems or other health problems associated with the pregnancy, it is completely safe to be sent home with false labor and await the onset of true labor. HOW CAN YOU TELL THE DIFFERENCE BETWEEN TRUE AND FALSE LABOR? False Labor  The contractions of false labor are usually shorter and not as hard as those of true labor.   The contractions are usually irregular.   The contractions are often felt in the front of the lower abdomen and in the groin.   The contractions may go away when you walk around or change positions while lying down.   The contractions get weaker and are shorter lasting as time goes on.   The contractions do not usually become progressively stronger, regular, and closer together as with true labor.  True Labor  Contractions in true labor last 30-70 seconds, become very regular, usually become more intense, and increase in frequency.   The contractions do not go away with walking.   The discomfort is usually felt in the top of the uterus and spreads to the lower abdomen and low back.   True labor can be  determined by your health care provider with an exam. This will show that the cervix is dilating and getting thinner.  WHAT TO REMEMBER  Keep up with your usual exercises and follow other instructions given by your health care provider.   Take medicines as directed by your health care provider.   Keep your regular prenatal appointments.   Eat and drink lightly if you think you are going into labor.   If Braxton Hicks contractions are making you uncomfortable:   Change your position from lying down or resting to walking, or from walking to resting.   Sit and rest in a tub of warm water.   Drink 2-3 glasses of water. Dehydration may cause these contractions.   Do slow and deep breathing several times an hour.  WHEN SHOULD I SEEK IMMEDIATE MEDICAL CARE? Seek immediate medical care if:  Your contractions become stronger, more regular, and closer together.   You have fluid leaking or gushing from your vagina.   You have a fever.   You pass blood-tinged mucus.   You have vaginal bleeding.   You have continuous abdominal pain.   You have low back pain that you never had before.   You feel your baby's head pushing down and causing pelvic pressure.   Your baby is not moving as much as it used to.  Document Released: 08/12/2005 Document Revised: 08/17/2013 Document Reviewed: 05/24/2013 ExitCare Patient Information 2015 [employer]. This information is not intended to replace advice given to you by your health care   provider. Make sure you discuss any questions you have with your health care provider.  Fetal Movement Counts Patient Name: __________________________________________________ Patient Due Date: ____________________ Performing a fetal movement count is highly recommended in high-risk pregnancies, but it is good for every pregnant woman to do. Your health care provider may ask you to start counting fetal movements at 28 weeks of the pregnancy. Fetal  movements often increase:  After eating a full meal.  After physical activity.  After eating or drinking something sweet or cold.  At rest. Pay attention to when you feel the baby is most active. This will help you notice a pattern of your baby's sleep and wake cycles and what factors contribute to an increase in fetal movement. It is important to perform a fetal movement count at the same time each day when your baby is normally most active.  HOW TO COUNT FETAL MOVEMENTS  Find a quiet and comfortable area to sit or lie down on your left side. Lying on your left side provides the best blood and oxygen circulation to your baby.  Write down the day and time on a sheet of paper or in a journal.  Start counting kicks, flutters, swishes, rolls, or jabs in a 2-hour period. You should feel at least 10 movements within 2 hours.  If you do not feel 10 movements in 2 hours, wait 2-3 hours and count again. Look for a change in the pattern or not enough counts in 2 hours. SEEK MEDICAL CARE IF:  You feel less than 10 counts in 2 hours, tried twice.  There is no movement in over an hour.  The pattern is changing or taking longer each day to reach 10 counts in 2 hours.  You feel the baby is not moving as he or she usually does. Date: ____________ Movements: ____________ Start time: ____________ Doreatha Martin time: ____________  Date: ____________ Movements: ____________ Start time: ____________ Doreatha Martin time: ____________ Date: ____________ Movements: ____________ Start time: ____________ Doreatha Martin time: ____________ Date: ____________ Movements: ____________ Start time: ____________ Doreatha Martin time: ____________ Date: ____________ Movements: ____________ Start time: ____________ Doreatha Martin time: ____________ Date: ____________ Movements: ____________ Start time: ____________ Doreatha Martin time: ____________ Date: ____________ Movements: ____________ Start time: ____________ Doreatha Martin time: ____________ Date: ____________  Movements: ____________ Start time: ____________ Doreatha Martin time: ____________  Date: ____________ Movements: ____________ Start time: ____________ Doreatha Martin time: ____________ Date: ____________ Movements: ____________ Start time: ____________ Doreatha Martin time: ____________ Date: ____________ Movements: ____________ Start time: ____________ Doreatha Martin time: ____________ Date: ____________ Movements: ____________ Start time: ____________ Doreatha Martin time: ____________ Date: ____________ Movements: ____________ Start time: ____________ Doreatha Martin time: ____________ Date: ____________ Movements: ____________ Start time: ____________ Doreatha Martin time: ____________ Date: ____________ Movements: ____________ Start time: ____________ Doreatha Martin time: ____________  Date: ____________ Movements: ____________ Start time: ____________ Doreatha Martin time: ____________ Date: ____________ Movements: ____________ Start time: ____________ Doreatha Martin time: ____________ Date: ____________ Movements: ____________ Start time: ____________ Doreatha Martin time: ____________ Date: ____________ Movements: ____________ Start time: ____________ Doreatha Martin time: ____________ Date: ____________ Movements: ____________ Start time: ____________ Doreatha Martin time: ____________ Date: ____________ Movements: ____________ Start time: ____________ Doreatha Martin time: ____________ Date: ____________ Movements: ____________ Start time: ____________ Doreatha Martin time: ____________  Date: ____________ Movements: ____________ Start time: ____________ Doreatha Martin time: ____________ Date: ____________ Movements: ____________ Start time: ____________ Doreatha Martin time: ____________ Date: ____________ Movements: ____________ Start time: ____________ Doreatha Martin time: ____________ Date: ____________ Movements: ____________ Start time: ____________ Doreatha Martin time: ____________ Date: ____________ Movements: ____________ Start time: ____________ Doreatha Martin time: ____________ Date: ____________ Movements: ____________ Start time:  ____________ Doreatha Martin time: ____________ Date: ____________ Movements:  ____________ Start time: ____________ Doreatha Martin time: ____________  Date: ____________ Movements: ____________ Start time: ____________ Doreatha Martin time: ____________ Date: ____________ Movements: ____________ Start time: ____________ Doreatha Martin time: ____________ Date: ____________ Movements: ____________ Start time: ____________ Doreatha Martin time: ____________ Date: ____________ Movements: ____________ Start time: ____________ Doreatha Martin time: ____________ Date: ____________ Movements: ____________ Start time: ____________ Doreatha Martin time: ____________ Date: ____________ Movements: ____________ Start time: ____________ Doreatha Martin time: ____________ Date: ____________ Movements: ____________ Start time: ____________ Doreatha Martin time: ____________  Date: ____________ Movements: ____________ Start time: ____________ Doreatha Martin time: ____________ Date: ____________ Movements: ____________ Start time: ____________ Doreatha Martin time: ____________ Date: ____________ Movements: ____________ Start time: ____________ Doreatha Martin time: ____________ Date: ____________ Movements: ____________ Start time: ____________ Doreatha Martin time: ____________ Date: ____________ Movements: ____________ Start time: ____________ Doreatha Martin time: ____________ Date: ____________ Movements: ____________ Start time: ____________ Doreatha Martin time: ____________ Date: ____________ Movements: ____________ Start time: ____________ Doreatha Martin time: ____________  Date: ____________ Movements: ____________ Start time: ____________ Doreatha Martin time: ____________ Date: ____________ Movements: ____________ Start time: ____________ Doreatha Martin time: ____________ Date: ____________ Movements: ____________ Start time: ____________ Doreatha Martin time: ____________ Date: ____________ Movements: ____________ Start time: ____________ Doreatha Martin time: ____________ Date: ____________ Movements: ____________ Start time: ____________ Doreatha Martin time: ____________ Date:  ____________ Movements: ____________ Start time: ____________ Doreatha Martin time: ____________ Date: ____________ Movements: ____________ Start time: ____________ Doreatha Martin time: ____________  Date: ____________ Movements: ____________ Start time: ____________ Doreatha Martin time: ____________ Date: ____________ Movements: ____________ Start time: ____________ Doreatha Martin time: ____________ Date: ____________ Movements: ____________ Start time: ____________ Doreatha Martin time: ____________ Date: ____________ Movements: ____________ Start time: ____________ Doreatha Martin time: ____________ Date: ____________ Movements: ____________ Start time: ____________ Doreatha Martin time: ____________ Date: ____________ Movements: ____________ Start time: ____________ Doreatha Martin time: ____________ Document Released: 09/11/2006 Document Revised: 12/27/2013 Document Reviewed: 06/08/2012 ExitCare Patient Information 2015 [employer]. This information is not intended to replace advice given to you by your health care provider. Make sure you discuss any questions you have with your health care provider.  Tdap Vaccine (Tetanus, Diphtheria, Pertussis): What You Need to Know 1. Why get vaccinated? Tetanus, diphtheria and pertussis can be very serious diseases, even for adolescents and adults. Tdap vaccine can protect [place] from these diseases. TETANUS (Lockjaw) causes painful muscle tightening and stiffness, usually all over the body.  It can lead to tightening of muscles in the head and neck so you can't open your mouth, swallow, or sometimes even breathe. Tetanus kills about 1 out of 5 people who are infected. DIPHTHERIA can cause a thick coating to form in the back of the throat.  It can lead to breathing problems, paralysis, heart failure, and death. PERTUSSIS (Whooping Cough) causes severe coughing spells, which can cause difficulty breathing, vomiting and disturbed sleep.  It can also lead to weight loss, incontinence, and rib fractures. Up to 2 in 100  adolescents and 5 in 100 adults with pertussis are hospitalized or have complications, which could include pneumonia or death. These diseases are caused by bacteria. Diphtheria and pertussis are spread from person to person through coughing or sneezing. Tetanus enters the body through cuts, scratches, or wounds. Before vaccines, the [place] saw as many as 200,000 cases a year of diphtheria and pertussis, and hundreds of cases of tetanus. Since vaccination began, tetanus and diphtheria have dropped by about 99% and pertussis by about 80%. 2. Tdap vaccine Tdap vaccine can protect adolescents and adults from tetanus, diphtheria, and pertussis. One dose of Tdap is routinely given at age 76 or 11. People who did not get Tdap at that age should get it as soon as possible.  Tdap is especially important for health care professionals and anyone having close contact with a baby younger than 12 months. Pregnant women should get a dose of Tdap during every pregnancy, to protect the newborn from pertussis. Infants are most at risk for severe, life-threatening complications from pertussis. A similar vaccine, called Td, protects from tetanus and diphtheria, but not pertussis. A Td booster should be given every 10 years. Tdap may be given as one of these boosters if you have not already gotten a dose. Tdap may also be given after a severe cut or burn to prevent tetanus infection. Your doctor can give you more information. Tdap may safely be given at the same time as other vaccines. 3. Some people should not get this vaccine  If you ever had a life-threatening allergic reaction after a dose of any tetanus, diphtheria, or pertussis containing vaccine, OR if you have a severe allergy to any part of this vaccine, you should not get Tdap. Tell your doctor if you have any severe allergies.  If you had a coma, or long or multiple seizures within 7 days after a childhood dose of DTP or DTaP, you should not get Tdap,  unless a cause other than the vaccine was found. You can still get Td.  Talk to your doctor if you:  have epilepsy or another nervous system problem,  had severe pain or swelling after any vaccine containing diphtheria, tetanus or pertussis,  ever had Guillain-Barr Syndrome (GBS),  aren't feeling well on the day the shot is scheduled. 4. Risks of a vaccine reaction With any medicine, including vaccines, there is a chance of side effects. These are usually mild and go away on their own, but serious reactions are also possible. Brief fainting spells can follow a vaccination, leading to injuries from falling. Sitting or lying down for about 15 minutes can help prevent these. Tell your doctor if you feel dizzy or light-headed, or have vision changes or ringing in the ears. Mild problems following Tdap (Did not interfere with activities)  Pain where the shot was given (about 3 in 4 adolescents or 2 in 3 adults)  Redness or swelling where the shot was given (about 1 person in 5)  Mild fever of at least 100.65F (up to about 1 in 25 adolescents or 1 in 100 adults)  Headache (about 3 or 4 people in 10)  Tiredness (about 1 person in 3 or 4)  Nausea, vomiting, diarrhea, stomach ache (up to 1 in 4 adolescents or 1 in 10 adults)  Chills, body aches, sore joints, rash, swollen glands (uncommon) Moderate problems following Tdap (Interfered with activities, but did not require medical attention)  Pain where the shot was given (about 1 in 5 adolescents or 1 in 100 adults)  Redness or swelling where the shot was given (up to about 1 in 16 adolescents or 1 in 25 adults)  Fever over 102F (about 1 in 100 adolescents or 1 in 250 adults)  Headache (about 3 in 20 adolescents or 1 in 10 adults)  Nausea, vomiting, diarrhea, stomach ache (up to 1 or 3 people in 100)  Swelling of the entire arm where the shot was given (up to about 3 in 100). Severe problems following Tdap (Unable to perform usual  activities; required medical attention)  Swelling, severe pain, bleeding and redness in the arm where the shot was given (rare). A severe allergic reaction could occur after any vaccine (estimated less than 1 in a million doses). 5. What  if there is a serious reaction? What should I look for?  Look for anything that concerns you, such as signs of a severe allergic reaction, very high fever, or behavior changes. Signs of a severe allergic reaction can include hives, swelling of the face and throat, difficulty breathing, a fast heartbeat, dizziness, and weakness. These would start a few minutes to a few hours after the vaccination. What should I do?  If you think it is a severe allergic reaction or other emergency that can't wait, call 9-1-1 or get the person to the nearest hospital. Otherwise, call your doctor.  Afterward, the reaction should be reported to the "Vaccine Adverse Event Reporting System" (VAERS). Your doctor might file this report, or you can do it yourself through the VAERS web site at www.vaers.LAgents.nohhs.gov, or by calling 1-6716661757. VAERS is only for reporting reactions. They do not give medical advice.  6. The National Vaccine Injury Compensation Program The [employer] Vaccine Injury Compensation Program (VICP) is a federal program that was created to compensate people who may have been injured by certain vaccines. Persons who believe they may have been injured by a vaccine can learn about the program and about filing a claim by calling 1-(405)727-4192 or visiting the VICP website at SpiritualWord.atwww.hrsa.gov/vaccinecompensation. 7. How can I learn more?  Ask your doctor.  Call your local or state health department.  Contact the Centers for Disease Control and Prevention (CDC):  Call 580-280-05181-782 356 4464 or visit CDC's website at PicCapture.uywww.cdc.gov/vaccines. CDC Tdap Vaccine VIS (01/02/12) Document Released: 02/11/2012 Document Revised: 12/27/2013 Document Reviewed: 11/24/2013 ExitCare Patient  Information 2015 [employer]. This information is not intended to replace advice given to you by your health care provider. Make sure you discuss any questions you have with your health care provider.

## 2014-12-23 NOTE — Progress Notes (Signed)
Pt refuses to do 1 hour.

## 2014-12-23 NOTE — Progress Notes (Signed)
NOB transfer from [facility]. Received care there from December -February. Stopped going due to being told that she could not bring her child. Seen in [facility] 12/13/14  For N/V. Had late decel, BPP 6/8. Obs overnight. Active baby today. Parental record from HD reviewed. NOB labs done. Quad normal. Missed 28 week labs. Refuses 1 hour GTT. Discussed risks of undiagnosed GDM including stillbirth, shoulder dystocia, injury or death from shoulder dystocia and delayed lung maturity. Pt still refused. Offered jelly bean 1 hour GTT or testing QID x 1 week. Still refused. Random CBG 88. HgbA1C drawn. Refuses TDaP toady, but may do at at [place]. Urine culture sent due to RBC/WBCs on urine. Asymptomatic.

## 2014-12-24 LAB — PRESCRIPTION MONITORING PROFILE (19 PANEL)
AMPHETAMINE/METH: NEGATIVE ng/mL
Barbiturate Screen, Urine: NEGATIVE ng/mL
Benzodiazepine Screen, Urine: NEGATIVE ng/mL
Buprenorphine, Urine: NEGATIVE ng/mL
CANNABINOID SCRN UR: NEGATIVE ng/mL
CARISOPRODOL, URINE: NEGATIVE ng/mL
Cocaine Metabolites: NEGATIVE ng/mL
Creatinine, Urine: 138.48 mg/dL (ref >20.0)
Fentanyl, Ur: NEGATIVE ng/mL
MDMA URINE: NEGATIVE ng/mL
METHAQUALONE SCREEN (URINE): NEGATIVE ng/mL
Meperidine, Ur: NEGATIVE ng/mL
Methadone Screen, Urine: NEGATIVE ng/mL
NITRITES URINE, INITIAL: NEGATIVE ug/mL
Opiate Screen, Urine: NEGATIVE ng/mL
Oxycodone Screen, Ur: NEGATIVE ng/mL
PROPOXYPHENE: NEGATIVE ng/mL
Phencyclidine, Ur: NEGATIVE ng/mL
TAPENTADOLUR: NEGATIVE ng/mL
Tramadol Scrn, Ur: NEGATIVE ng/mL
ZOLPIDEM, URINE: NEGATIVE ng/mL
pH, Initial: 7.5 pH (ref 4.5–8.9)

## 2014-12-24 LAB — HIV ANTIBODY (ROUTINE TESTING W REFLEX): HIV: NONREACTIVE

## 2014-12-24 LAB — RPR

## 2014-12-25 LAB — CULTURE, OB URINE: Colony Count: 30000

## 2014-12-30 ENCOUNTER — Other Ambulatory Visit: Payer: Self-pay | Admitting: Obstetrics and Gynecology

## 2014-12-30 ENCOUNTER — Ambulatory Visit (INDEPENDENT_AMBULATORY_CARE_PROVIDER_SITE_OTHER): Payer: Self-pay | Admitting: Obstetrics and Gynecology

## 2014-12-30 ENCOUNTER — Encounter: Payer: Self-pay | Admitting: Obstetrics and Gynecology

## 2014-12-30 VITALS — BP 125/71 | HR 107 | Wt 209.9 lb

## 2014-12-30 DIAGNOSIS — O0933 Supervision of pregnancy with insufficient antenatal care, third trimester: Secondary | ICD-10-CM

## 2014-12-30 DIAGNOSIS — A749 Chlamydial infection, unspecified: Secondary | ICD-10-CM

## 2014-12-30 DIAGNOSIS — O98219 Gonorrhea complicating pregnancy, unspecified trimester: Secondary | ICD-10-CM

## 2014-12-30 DIAGNOSIS — O98313 Other infections with a predominantly sexual mode of transmission complicating pregnancy, third trimester: Secondary | ICD-10-CM

## 2014-12-30 DIAGNOSIS — O98813 Other maternal infectious and parasitic diseases complicating pregnancy, third trimester: Principal | ICD-10-CM

## 2014-12-30 LAB — OB RESULTS CONSOLE GBS: STREP GROUP B AG: POSITIVE

## 2014-12-30 LAB — OB RESULTS CONSOLE GC/CHLAMYDIA: GC PROBE AMP, GENITAL: NEGATIVE

## 2014-12-30 NOTE — Progress Notes (Signed)
Patient is doing well without complaints. FM/labor precautions reviewed. Cultures collected today . Fetus in transverse position. Discussed ECV vs c-section. Patient is undecided and will decide at her next visit if presentation is unchanged Patient desires BTL for contraception

## 2014-12-31 LAB — GC/CHLAMYDIA PROBE AMP
CT Probe RNA: NEGATIVE
GC Probe RNA: NEGATIVE

## 2014-12-31 LAB — CULTURE, BETA STREP (GROUP B ONLY)

## 2015-01-11 ENCOUNTER — Ambulatory Visit (INDEPENDENT_AMBULATORY_CARE_PROVIDER_SITE_OTHER): Payer: Self-pay | Admitting: Physician Assistant

## 2015-01-11 VITALS — BP 124/72 | HR 99 | Temp 97.5°F | Wt 207.9 lb

## 2015-01-11 DIAGNOSIS — O0933 Supervision of pregnancy with insufficient antenatal care, third trimester: Secondary | ICD-10-CM

## 2015-01-11 LAB — POCT URINALYSIS DIP (DEVICE)
Glucose, UA: NEGATIVE mg/dL
KETONES UR: NEGATIVE mg/dL
Nitrite: NEGATIVE
PROTEIN: 30 mg/dL — AB
Specific Gravity, Urine: 1.02 (ref 1.005–1.030)
Urobilinogen, UA: 0.2 mg/dL (ref 0.0–1.0)
pH: 7 (ref 5.0–8.0)

## 2015-01-11 NOTE — Progress Notes (Signed)
Mod leuks on UA; reports pelvic pressure/pain

## 2015-01-11 NOTE — Progress Notes (Addendum)
38 weeks, stable without complaint.   Endorses good fetal movement.  Denies LOF, VB, dysuria.   Internal and external exam suggestive of vertex presentation.  Will confirm with office u/s.   RTC 1 week  Bedside US for presentation - vertex confirmed.  Diane Day RNC

## 2015-01-11 NOTE — Patient Instructions (Signed)
Pain Relief During Labor and Delivery Everyone experiences pain differently, but labor causes severe pain for many women. The amount of pain you experience during labor and delivery depends on your pain tolerance, contraction strength, and your baby's size and position. There are many ways to prepare for and deal with the pain, including:   Taking prenatal classes to learn about labor and delivery. The more informed you are, the less anxious and afraid you may be. This can help lessen the pain.  Taking pain-relieving medicine during labor and delivery.  Learning breathing and relaxation techniques.  Taking a shower or bath.  Getting massaged.  Changing positions.  Placing an ice pack on your back. Discuss your pain control options with your health care provider during your prenatal visits.  WHAT ARE THE TWO TYPES OF PAIN-RELIEVING MEDICINES? 1. Analgesics. These are medicines that decrease pain without total loss of feeling or muscle movement. 2. Anesthetics. These are medicines that block all feeling, including pain. There can be minor side effects of both types, such as nausea, trouble concentrating, becoming sleepy, and lowering the heart rate of the baby. However, health care providers are careful to give doses that will not seriously affect the baby.  WHAT ARE THE SPECIFIC TYPES OF ANALGESICS AND ANESTHETICS? Systemic Analgesic Systemic pain medicines affect your whole body rather than focusing pain relief on the area of your body experiencing pain. This type of medicine is given either through an IV tube in your vein or by a shot (injection) into your muscle. This medicine will lessen your pain but will not stop it completely. It may also make you sleepy, but it will not make you lose consciousness.  Local Anesthetic Local anesthetic isused tonumb a small area of your body. The medicine is injected into the area of nerves that carry feeling to the vagina, vulva, or the area between  the vagina and anus (perineum).  General Anesthetic This type of medicine causes you to lose consciousness so you do not feel pain. It is usually used only in emergency situations during labor. It is given through an IV tube or face mask. Paracervical Block A paracervical block is a form of local anesthesia given during labor. Numbing medicine is injected into the right and left sides of the cervix and vagina. It helps to lessen the pain caused by contractions and stretching of the cervix. It may have to be given more than once.  Pudendal Block A pudendal block is another form of local anesthesia. It is used to relieve the pain associated with pushing or stretching of the perineum at the time of delivery. An injection is given deep through the vaginal wall into the pudendal nerve in the pelvis, numbing the perineum.  Epidural Anesthetic An epidural is an injection of numbing medicine given in the lower back and into the epidural space near your spinal cord. The epidural numbs the lower half of your body. You may be able to move your legs but will not be allowed to walk. Epidurals can be used for labor, delivery, or cesarean deliveries.  To prevent the medicine from wearing off, a small tube (catheter) may be threaded into the epidural space and taped in place to prevent it from slipping out. Medicine can then be given continuously in small doses through the tube until you deliver. Spinal Block A spinal block is similar to an epidural, but the medicine is injected into the spinal fluid, not the epidural space. A spinal block is only given   once. It starts to relieve pain quickly but lasts only 1-2 hours. Spinal blocks can also be used for cesarean deliveries.  Combined Spinal-Epidural Block Combined spinal-epidural blocks combine the benefits of both the spinal and epidural blocks. The spinal part acts quickly to relieve pain and the epidural provides continuous pain relief. Hydrotherapy Immersion in  warm water during labor may provide comfort and relaxation. It may also help to lessen pain, the use of anesthesia, and the length of labor. However, immersion in water during the delivery (water birth) may have some risk involved and studies to determine safety and risks are ongoing. If you are a healthy woman who is expecting an uncomplicated birth, talk with your health care provider to see if water birth is an option for you.  Document Released: 11/28/2008 Document Revised: 08/17/2013 Document Reviewed: 12/31/2012 Exit[employer] Patient Information 2015 [employer]. This information is not intended to replace advice given to you by your health care provider. Make sure you discuss any questions you have with your health care provider.  

## 2015-01-18 ENCOUNTER — Ambulatory Visit (INDEPENDENT_AMBULATORY_CARE_PROVIDER_SITE_OTHER): Payer: Self-pay | Admitting: Advanced Practice Midwife

## 2015-01-18 VITALS — BP 122/74 | HR 98 | Temp 97.8°F | Wt 211.9 lb

## 2015-01-18 DIAGNOSIS — O0933 Supervision of pregnancy with insufficient antenatal care, third trimester: Secondary | ICD-10-CM

## 2015-01-18 DIAGNOSIS — Z3493 Encounter for supervision of normal pregnancy, unspecified, third trimester: Secondary | ICD-10-CM

## 2015-01-18 DIAGNOSIS — Z3483 Encounter for supervision of other normal pregnancy, third trimester: Secondary | ICD-10-CM

## 2015-01-18 LAB — POCT URINALYSIS DIP (DEVICE)
Bilirubin Urine: NEGATIVE
GLUCOSE, UA: NEGATIVE mg/dL
Nitrite: NEGATIVE
PH: 7 (ref 5.0–8.0)
Protein, ur: 30 mg/dL — AB
Specific Gravity, Urine: 1.02 (ref 1.005–1.030)
Urobilinogen, UA: 1 mg/dL (ref 0.0–1.0)

## 2015-01-18 NOTE — Patient Instructions (Signed)

## 2015-01-18 NOTE — Progress Notes (Signed)
Ketones: Trace, Hgb Trace, Leukocytes: small  Pt wants to sign Tubal Papers

## 2015-01-18 NOTE — Progress Notes (Signed)
Declines tdap 

## 2015-01-18 NOTE — Progress Notes (Signed)
US to verify presentation. BTL papers signed. Discussed LARC.

## 2015-01-20 ENCOUNTER — Encounter: Payer: Self-pay | Admitting: *Deleted

## 2015-01-23 ENCOUNTER — Inpatient Hospital Stay (HOSPITAL_COMMUNITY): Payer: Medicaid Other | Admitting: Anesthesiology

## 2015-01-23 ENCOUNTER — Inpatient Hospital Stay (HOSPITAL_COMMUNITY)
Admission: AD | Admit: 2015-01-23 | Discharge: 2015-01-25 | DRG: 775 | Disposition: A | Discharge status: Home / Self Care | Payer: Medicaid Other | Source: Ambulatory Visit | Attending: Obstetrics and Gynecology | Admitting: Obstetrics and Gynecology

## 2015-01-23 ENCOUNTER — Encounter (HOSPITAL_COMMUNITY): Payer: Self-pay | Admitting: Anesthesiology

## 2015-01-23 ENCOUNTER — Encounter (HOSPITAL_COMMUNITY): Payer: Self-pay | Admitting: *Deleted

## 2015-01-23 DIAGNOSIS — Z3A40 40 weeks gestation of pregnancy: Secondary | ICD-10-CM | POA: Diagnosis present

## 2015-01-23 DIAGNOSIS — O98219 Gonorrhea complicating pregnancy, unspecified trimester: Secondary | ICD-10-CM

## 2015-01-23 DIAGNOSIS — Z3483 Encounter for supervision of other normal pregnancy, third trimester: Secondary | ICD-10-CM | POA: Diagnosis present

## 2015-01-23 DIAGNOSIS — O4292 Full-term premature rupture of membranes, unspecified as to length of time between rupture and onset of labor: Secondary | ICD-10-CM

## 2015-01-23 DIAGNOSIS — A749 Chlamydial infection, unspecified: Secondary | ICD-10-CM

## 2015-01-23 DIAGNOSIS — O99824 Streptococcus B carrier state complicating childbirth: Secondary | ICD-10-CM | POA: Diagnosis present

## 2015-01-23 DIAGNOSIS — O480 Post-term pregnancy: Principal | ICD-10-CM | POA: Diagnosis present

## 2015-01-23 DIAGNOSIS — O98813 Other maternal infectious and parasitic diseases complicating pregnancy, third trimester: Secondary | ICD-10-CM

## 2015-01-23 DIAGNOSIS — O0933 Supervision of pregnancy with insufficient antenatal care, third trimester: Secondary | ICD-10-CM | POA: Diagnosis not present

## 2015-01-23 HISTORY — DX: Personal history of diseases of the blood and blood-forming organs and certain disorders involving the immune mechanism: Z86.2

## 2015-01-23 LAB — CBC
HCT: 29.9 % — ABNORMAL LOW (ref 36.0–46.0)
Hemoglobin: 9.4 g/dL — ABNORMAL LOW (ref 12.0–15.0)
MCH: 25.3 pg — ABNORMAL LOW (ref 26.0–34.0)
MCHC: 31.4 g/dL (ref 30.0–36.0)
MCV: 80.4 fL (ref 78.0–100.0)
Platelets: 172 10*3/uL (ref 150–400)
RBC: 3.72 MIL/uL — AB (ref 3.87–5.11)
RDW: 15.9 % — ABNORMAL HIGH (ref 11.5–15.5)
WBC: 9.1 10*3/uL (ref 4.0–10.5)

## 2015-01-23 LAB — POCT FERN TEST: POCT FERN TEST: POSITIVE

## 2015-01-23 MED ORDER — DIPHENHYDRAMINE HCL 50 MG/ML IJ SOLN: 12.5000 mg | INTRAMUSCULAR | Status: DC | PRN

## 2015-01-23 MED ORDER — OXYCODONE-ACETAMINOPHEN 5-325 MG PO TABS
1.0000 | ORAL_TABLET | ORAL | Status: DC | PRN
  Administered 2015-01-24 – 2015-01-25 (×2): 1 via ORAL
  Filled 2015-01-23 (×3): qty 1

## 2015-01-23 MED ORDER — PENICILLIN G POTASSIUM 5000000 UNITS IJ SOLR
2.5000 10*6.[IU] | INTRAVENOUS | Status: DC
  Administered 2015-01-23: 2.5 10*6.[IU] via INTRAVENOUS
  Filled 2015-01-23 (×15): qty 2.5

## 2015-01-23 MED ORDER — PHENYLEPHRINE 40 MCG/ML (10ML) SYRINGE FOR IV PUSH (FOR BLOOD PRESSURE SUPPORT)
80.0000 ug | PREFILLED_SYRINGE | INTRAVENOUS | Status: DC | PRN
  Filled 2015-01-23: qty 2
  Filled 2015-01-23: qty 20

## 2015-01-23 MED ORDER — EPHEDRINE 5 MG/ML INJ
10.0000 mg | INTRAVENOUS | Status: DC | PRN
  Filled 2015-01-23: qty 2

## 2015-01-23 MED ORDER — LACTATED RINGERS IV SOLN
INTRAVENOUS | Status: DC
  Administered 2015-01-23: 125 mL/h via INTRAVENOUS
  Administered 2015-01-23: 20:00:00 via INTRAVENOUS

## 2015-01-23 MED ORDER — BUTORPHANOL TARTRATE 1 MG/ML IJ SOLN: 1.0000 mg | INTRAMUSCULAR | Status: DC | PRN

## 2015-01-23 MED ORDER — PENICILLIN G POTASSIUM 5000000 UNITS IJ SOLR
5.0000 10*6.[IU] | Freq: Once | INTRAVENOUS | Status: AC
  Administered 2015-01-23: 5 10*6.[IU] via INTRAVENOUS
  Filled 2015-01-23: qty 5

## 2015-01-23 MED ORDER — FENTANYL CITRATE (PF) 100 MCG/2ML IJ SOLN: 50.0000 ug | INTRAMUSCULAR | Status: DC | PRN

## 2015-01-23 MED ORDER — OXYTOCIN BOLUS FROM INFUSION
500.0000 mL | INTRAVENOUS | Status: DC
  Administered 2015-01-23: 500 mL via INTRAVENOUS

## 2015-01-23 MED ORDER — LIDOCAINE HCL (PF) 1 % IJ SOLN
30.0000 mL | INTRAMUSCULAR | Status: DC | PRN
  Filled 2015-01-23 (×2): qty 30

## 2015-01-23 MED ORDER — LACTATED RINGERS IV SOLN
500.0000 mL | INTRAVENOUS | Status: DC | PRN
  Administered 2015-01-23 (×3): 500 mL via INTRAVENOUS

## 2015-01-23 MED ORDER — OXYTOCIN 40 UNITS IN LACTATED RINGERS INFUSION - SIMPLE MED
1.0000 m[IU]/min | INTRAVENOUS | Status: DC
  Administered 2015-01-23: 4 m[IU]/min via INTRAVENOUS
  Administered 2015-01-23: 2 m[IU]/min via INTRAVENOUS

## 2015-01-23 MED ORDER — ACETAMINOPHEN 325 MG PO TABS: 650.0000 mg | ORAL_TABLET | ORAL | Status: DC | PRN

## 2015-01-23 MED ORDER — FENTANYL 2.5 MCG/ML BUPIVACAINE 1/10 % EPIDURAL INFUSION (WH - ANES)
14.0000 mL/h | INTRAMUSCULAR | Status: DC | PRN
  Filled 2015-01-23: qty 125

## 2015-01-23 MED ORDER — CITRIC ACID-SODIUM CITRATE 334-500 MG/5ML PO SOLN: 30.0000 mL | ORAL | Status: DC | PRN

## 2015-01-23 MED ORDER — OXYCODONE-ACETAMINOPHEN 5-325 MG PO TABS: 2.0000 | ORAL_TABLET | ORAL | Status: DC | PRN

## 2015-01-23 MED ORDER — TERBUTALINE SULFATE 1 MG/ML IJ SOLN: 0.2500 mg | Freq: Once | INTRAMUSCULAR | Status: AC | PRN

## 2015-01-23 MED ORDER — ONDANSETRON HCL 4 MG/2ML IJ SOLN: 4.0000 mg | Freq: Four times a day (QID) | INTRAMUSCULAR | Status: DC | PRN

## 2015-01-23 MED ORDER — SODIUM CHLORIDE 0.9 % IV BOLUS (SEPSIS): 1000.0000 mL | Freq: Once | INTRAVENOUS | Status: DC

## 2015-01-23 MED ORDER — OXYTOCIN 40 UNITS IN LACTATED RINGERS INFUSION - SIMPLE MED
62.5000 mL/h | INTRAVENOUS | Status: DC
  Filled 2015-01-23: qty 1000

## 2015-01-23 NOTE — Progress Notes (Signed)
Labor Progress Note  ASSESSMENT:   [name] SicilianDaphanie S [name] 23 y.o. [id number] at 3830w0d in latent labor   PLAN:  1) Labor curve reviewed.       Progress: Latent         1500: PROM (clear)  1600: 3.5/60/-3     Plan: Continue expectant management. If not feeling contractions by 9pm - 3am, will consider starting pitocin  2) Fetal heart tracing reviewed.  Cat II, due to minimal variability  - IVF boluf  3) GBS Status - Positive, on PCN  4) Other Problems Active Problems:   Normal pregnancy   SUBJECTIVE:  Feeling minimal contractions   OBJECTIVE:  Vital Signs: Patient Vitals for the past 2 hrs:  BP Temp Pulse Resp  01/23/15 1801 126/68 mmHg 97.7 F (36.5 C) 85 20  01/23/15 1742 117/71 mmHg - 89 20   SVE: Dilation: 3.5, Effacement (%): 60, Station: -3  FHR Monitoring Baseline Rate (A): 145 bpm/minimal/+accels/no decels   Accelerations: 10 x 10 Contraction Frequency (min): occ uc

## 2015-01-23 NOTE — Progress Notes (Signed)
Subjective: Comfortable, feeling pressure without significant pain. Interested in epidural. - Denies HA, abd pain, swelling  Objective: BP 94/76 mmHg  Pulse 94  Temp(Src) 97.4 F (36.3 C) (Oral)  Resp 18  Ht 5\' 5"  (1.651 m)  Wt 95.709 kg (211 lb)  BMI 35.11 kg/m2  LMP 04/18/2014      FHT:  FHR: 135 bpm, variability: minimal ,  accelerations:  Present (15x15),  decelerations:  Absent UC:   regular, every 6-8 minutes SVE:   Dilation: 4 Effacement (%): 60 Station: -3 Exam by:: Philipp DeputyKim Shaw, CNM  Labs: Lab Results  Component Value Date   WBC 9.1 01/23/2015   HGB 9.4* 01/23/2015   HCT 29.9* 01/23/2015   MCV 80.4 01/23/2015   PLT 172 01/23/2015    Assessment / Plan: [name] [name] is a 24 y.o. [id number] at 563w0d by 8wk ultrasound admitted for SROM at 1500, gradual increasing ctx, favorable cervix but minimal change x 6 hours.  Labor: Gradual active labor, remains favorable cervix, ctx infrequent but seem adequate. Start Pitocin for augmentation. Fetal Wellbeing:  Category I Pain Control:  Fentanyl IV PRN, may have epidural (plans to request following start of Pit) I/D:  GBS positive - PCN prophylaxis (adequate, started at 1738) Anticipated MOD:  NSVD  Saralyn PilarAlexander Ruston Fedora, DO [facility], PGY-2 01/23/2015, 9:23 PM

## 2015-01-23 NOTE — H&P (Signed)
LABOR ADMISSION HISTORY AND PHYSICAL  Makyia MODELLE VOLLMER is a 24 y.o. female [phone] with IUP at [redacted]w[redacted]d by L/8 presenting for ROM of clear fluids at 3pm. She reports +FMs, No VB, no blurry vision, headaches or peripheral edema, and RUQ pain.  She plans on bottle feeding. She request BTL for birth control.  Is only having minimal contractions  Dating: By 8 wk [place] --->  Estimated Date of Delivery: 01/23/15  Sono:   Normal anatomy   Prenatal History/Complications: Gonorrhea Chlamydia  Past Medical History: Past Medical History  Diagnosis Date  . Eczema   . Infection     chlamydia  . Trichomonas   . Chlamydia   . Gonorrhea     Past Surgical History: Past Surgical History  Procedure Laterality Date  . Wisdom tooth extraction    . Fractured finger as a child      Obstetrical History: OB History    Gravida Para Term Preterm AB TAB SAB Ectopic Multiple Living   Social History: History   Social History  . Marital Status: Single    Spouse Name: N/A  . Number of Children: N/A  . Years of Education: N/A   Social History Main Topics  . Smoking status: Never Smoker   . Smokeless tobacco: Never Used  . Alcohol Use: No  . Drug Use: No  . Sexual Activity:    Partners: Male    Birth Control/ Protection: None   Other Topics Concern  . None   Social History Narrative    Family History: Family History  Problem Relation Age of Onset  . Other Neg Hx   . Asthma Sister   . Heart disease Maternal Grandmother     Allergies: No Known Allergies  No prescriptions prior to admission     Review of Systems   All systems reviewed and negative except as stated in HPI  Blood pressure 132/62, pulse 99, temperature 97.7 F (36.5 C), temperature source Oral, resp. rate 16, height  (1.651 m), weight 211 lb (95.709 kg), last menstrual period 04/18/2014, not currently breastfeeding. General appearance: alert and cooperative Lungs: clear to  auscultation bilaterally Heart: regular rate and rhythm Abdomen: soft, non-tender; bowel sounds normal Pelvic: Adequate Extremities: Homans sign is negative, no sign of DVT Presentation: cephalic and by BSUS Fetal monitoringBaseline: 150 bpm, Variability: Fair (1-6 bpm), Accelerations: Reactive and Decelerations: Absent Uterine activity Occasional, every 10 - 15 min  Dilation: 3.5 Effacement (%): 60 Station: -3 Exam by:: Elie Confer RN   Prenatal labs: ABO, Rh:  A+ Antibody:  Neg Rubella:  Immune RPR: NON REAC (04/29 1049)  HBsAg:   neg HIV: NONREACTIVE (04/29 1049)  GBS:   POS 1 hr Glucola -DECLINED, A1C 5.9% Genetic screening  Quad neg Anatomy US WNL  Prenatal Transfer Tool  Maternal Diabetes: No Genetic Screening: Normal Maternal Ultrasounds/Referrals: Normal Fetal Ultrasounds or other Referrals:  None Maternal Substance Abuse:  No Significant Maternal Medications:  None Significant Maternal Lab Results: Lab values include: Group B Strep positive  Results for orders placed or performed during the hospital encounter of 01/23/15 (from the past 24 hour(s))  Fern Test   Collection Time: 01/23/15  3:58 PM  Result Value Ref Range   POCT Fern Test Positive = ruptured amniotic membanes     Patient Active Problem List   Diagnosis Date Noted  . Normal pregnancy 01/23/2015  . Supervision of normal  pregnancy in third trimester 01/18/2015  . Late prenatal care affecting pregnancy, antepartum 12/23/2014  . Noncompliant pregnant patient, antepartum 12/23/2014  . Intractable nausea and vomiting 12/14/2014  . Late deceleration of fetal heart rate   . Chlamydia infection affecting pregnancy, antepartum 12/13/2014  . Gonorrhea in pregnancy, antepartum 12/13/2014    Assessment: Terez Marni GriffonS Perrier is a 24 y.o. [id number] at 2460w0d here for PROM at 3pm  #Labor: Pt elects for expectant management, but willing to start pitocin if no onset of spontaneous labor in  6-12hours #Pain: Requests epidural #FWB:  Cat I, reassuring #ID:  GBS +, start PCN #MOF:  Formula #MOC: BTL #Circ:  Declines  Elita BooneRoberts, Marializ Ferrebee C 01/23/2015, 4:10 PM

## 2015-01-23 NOTE — Progress Notes (Signed)
Provider at bedside

## 2015-01-23 NOTE — Plan of Care (Signed)
Problem: Consults Goal: Birthing Suites Patient Information Press F2 to bring up selections list  Outcome: Completed/Met Date Met:  01/23/15  Pt > [redacted] weeks EGA

## 2015-01-23 NOTE — Anesthesia Preprocedure Evaluation (Signed)

## 2015-01-24 ENCOUNTER — Encounter (HOSPITAL_COMMUNITY): Payer: Self-pay | Admitting: *Deleted

## 2015-01-24 DIAGNOSIS — Z3A40 40 weeks gestation of pregnancy: Secondary | ICD-10-CM

## 2015-01-24 DIAGNOSIS — O0933 Supervision of pregnancy with insufficient antenatal care, third trimester: Secondary | ICD-10-CM

## 2015-01-24 DIAGNOSIS — O99824 Streptococcus B carrier state complicating childbirth: Secondary | ICD-10-CM

## 2015-01-24 LAB — CBC
HCT: 28.6 % — ABNORMAL LOW (ref 36.0–46.0)
HEMOGLOBIN: 9.1 g/dL — AB (ref 12.0–15.0)
MCH: 25.5 pg — ABNORMAL LOW (ref 26.0–34.0)
MCHC: 31.8 g/dL (ref 30.0–36.0)
MCV: 80.1 fL (ref 78.0–100.0)
Platelets: 171 10*3/uL (ref 150–400)
RBC: 3.57 MIL/uL — ABNORMAL LOW (ref 3.87–5.11)
RDW: 15.8 % — ABNORMAL HIGH (ref 11.5–15.5)
WBC: 13.5 10*3/uL — ABNORMAL HIGH (ref 4.0–10.5)

## 2015-01-24 MED ORDER — PRENATAL MULTIVITAMIN CH
1.0000 | ORAL_TABLET | Freq: Every day | ORAL | Status: DC
  Administered 2015-01-24: 1 via ORAL
  Filled 2015-01-24 (×2): qty 1

## 2015-01-24 MED ORDER — IBUPROFEN 600 MG PO TABS
600.0000 mg | ORAL_TABLET | Freq: Four times a day (QID) | ORAL | Status: DC
  Administered 2015-01-24 – 2015-01-25 (×8): 600 mg via ORAL
  Filled 2015-01-24 (×8): qty 1

## 2015-01-24 MED ORDER — TETANUS-DIPHTH-ACELL PERTUSSIS 5-2.5-18.5 LF-MCG/0.5 IM SUSP: 0.5000 mL | Freq: Once | INTRAMUSCULAR | Status: DC

## 2015-01-24 MED ORDER — WITCH HAZEL-GLYCERIN EX PADS: 1.0000 "application " | MEDICATED_PAD | CUTANEOUS | Status: DC | PRN

## 2015-01-24 MED ORDER — ONDANSETRON HCL 4 MG PO TABS: 4.0000 mg | ORAL_TABLET | ORAL | Status: DC | PRN

## 2015-01-24 MED ORDER — LANOLIN HYDROUS EX OINT
TOPICAL_OINTMENT | CUTANEOUS | Status: DC | PRN
Start: 2015-01-24 — End: 2015-01-26

## 2015-01-24 MED ORDER — DIBUCAINE 1 % RE OINT: 1.0000 "application " | TOPICAL_OINTMENT | RECTAL | Status: DC | PRN

## 2015-01-24 MED ORDER — DIPHENHYDRAMINE HCL 25 MG PO CAPS: 25.0000 mg | ORAL_CAPSULE | Freq: Four times a day (QID) | ORAL | Status: DC | PRN

## 2015-01-24 MED ORDER — SENNOSIDES-DOCUSATE SODIUM 8.6-50 MG PO TABS
2.0000 | ORAL_TABLET | ORAL | Status: DC
  Administered 2015-01-25: 2 via ORAL
  Filled 2015-01-24: qty 2

## 2015-01-24 MED ORDER — OXYCODONE-ACETAMINOPHEN 5-325 MG PO TABS
2.0000 | ORAL_TABLET | ORAL | Status: DC | PRN
Start: 2015-01-24 — End: 2015-01-26

## 2015-01-24 MED ORDER — MISOPROSTOL 200 MCG PO TABS
800.0000 ug | ORAL_TABLET | Freq: Once | ORAL | Status: AC
  Administered 2015-01-24: 800 ug via ORAL
  Filled 2015-01-24: qty 4

## 2015-01-24 MED ORDER — OXYCODONE-ACETAMINOPHEN 5-325 MG PO TABS
1.0000 | ORAL_TABLET | ORAL | Status: DC | PRN
Start: 2015-01-24 — End: 2015-01-26
  Administered 2015-01-24 (×2): 1 via ORAL
  Filled 2015-01-24: qty 1

## 2015-01-24 MED ORDER — ACETAMINOPHEN 325 MG PO TABS: 650.0000 mg | ORAL_TABLET | ORAL | Status: DC | PRN

## 2015-01-24 MED ORDER — ONDANSETRON HCL 4 MG/2ML IJ SOLN: 4.0000 mg | INTRAMUSCULAR | Status: DC | PRN

## 2015-01-24 MED ORDER — BENZOCAINE-MENTHOL 20-0.5 % EX AERO: 1.0000 "application " | INHALATION_SPRAY | CUTANEOUS | Status: DC | PRN

## 2015-01-24 MED ORDER — SIMETHICONE 80 MG PO CHEW
80.0000 mg | CHEWABLE_TABLET | ORAL | Status: DC | PRN
Start: 2015-01-24 — End: 2015-01-26

## 2015-01-24 MED ORDER — ZOLPIDEM TARTRATE 5 MG PO TABS: 5.0000 mg | ORAL_TABLET | Freq: Every evening | ORAL | Status: DC | PRN

## 2015-01-24 NOTE — Anesthesia Postprocedure Evaluation (Signed)
  Anesthesia Post-op Note  Patient: [name] [name]  Procedure(s) Performed: * No procedures listed *  Patient Location: Mother/Baby  Anesthesia Type:Epidural  Level of Consciousness: awake, alert , oriented and patient cooperative  Airway and Oxygen Therapy: Patient Spontanous Breathing  Post-op Pain: mild  Post-op Assessment: Patient's Cardiovascular Status Stable, Respiratory Function Stable, No headache, No backache, No residual numbness and No residual motor weakness  Post-op Vital Signs: stable  Last Vitals:  Filed Vitals:   01/24/15 0743  BP: 121/67  Pulse: 65  Temp: 36.4 C  Resp: 18    Complications: No apparent anesthesia complications

## 2015-01-24 NOTE — Progress Notes (Signed)
  CLINICAL SOCIAL WORK MATERNAL/CHILD NOTE  Patient Details  Name: [name] [name] MRN: [id number] Date of Birth: 01/23/2013  Date:  01/24/2015  Clinical Social Worker Initiating Note:  Loleta BooksSarah Yari Szeliga, LCSW Date/ Time Initiated:  01/24/15/1430     Child's Name:  [name] [name]   Legal Guardian:  Mother : [name] [name]  Need for Interpreter:  None   Date of Referral:  01/24/15     Reason for Referral:  Psychosocial assessment  Referral Source:  Physician   Address:  [address] [place], [place] [address]  Phone number:  [phone]   Household Members:  Minor Children (01/12/14, 11/15/11, and 03/21/10), Self   Natural Supports (not living in the home):  Extended Family, Immediate Family   Professional Supports: None   Employment: Part-time   Type of Work: Conservation officer, natureCashier at [employer]   Education:    N/A  Surveyor, quantityinancial Resources:  [employer]icaid   Other Resources:  Sales executiveood Stamps , WIC   Cultural/Religious Considerations Which May Impact Care:  None reported  Strengths:  Ability to meet basic needs , Home prepared for child , Pediatrician chosen    Risk Factors/Current Problems:  None   Cognitive State:  Able to Concentrate , Alert , Goal Oriented    Mood/Affect:  Euthymic    CSW Assessment:  CSW received consult from pediatrician for psychosocial assessment and to provide support since MOB now has 4 children under the age of 22 years old.  MOB presented as difficult to engage and was a limited historian. She maintained minimal eye contact and provided short/concise answers related to how she is feeling as she transitions to the postpartum period.  MOB was noted to be interacting with the infant during the visit.   MOB did not identify any stressors that may impact her transition to the postpartum period. She stated that her other children are being watched by her grandparents and her sister, and shared belief that they will transition well with the birth of this infant. MOB endorsed  having the home prepared for the infant.  She stated that she lives alone with her children, but denied feeling stressed as she will be recovering from delivery while caring for her children.  CSW noted in MOB's chart plans to have a BTL for form of birth control.  MOB denied history of perinatal mood and anxiety disorders, but agreed to contact her medical providers if needs arise.  CSW Plan/Description:   1)Patient/Family Education: Perinatal mood and anxiety disorders 2)No Further Intervention Required/No Barriers to Discharge    Kelby FamVenning, Alexi Dorminey N, LCSW 01/24/2015, 2:49 PM

## 2015-01-24 NOTE — Progress Notes (Signed)
Post Partum Day 1 Subjective:  Darlina SicilianDaphanie S Jungbluth is a 24 y.o. [id number] 2428w0d s/p NSVD @ 2344 on 5/30.  No acute events overnight.  Pt denies problems with ambulating, voiding or po intake.  She denies nausea or vomiting.  Pain is well controlled.  She has had flatus. She has not had bowel movement.  Lochia Large.  Plan for birth control is bilateral tubal ligation (papers signed on 5/25).  Method of Feeding: Bottle. Patient does not want a circumcision for her baby.  Objective: Blood pressure 121/67, pulse 65, temperature 97.6 F (36.4 C), temperature source Oral, resp. rate 18, height 5\' 5"  (1.651 m), weight 95.709 kg (211 lb), last menstrual period 04/18/2014, SpO2 100 %, unknown if currently breastfeeding.  Physical Exam:  General: alert, cooperative and no distress Lochia: normal flow Chest: CTAB Heart: RRR no m/r/g Abdomen: +BS, soft, and nontender Uterine Fundus: firm, palpable at the level of the umbilicus DVT Evaluation: No evidence of DVT seen on physical exam. Extremities: No edema   Recent Labs  01/23/15 1738 01/24/15 0540  HGB 9.4* 9.1*  HCT 29.9* 28.6*    Assessment/Plan:  ASSESSMENT: Chidinma S Upchurch is a 24 y.o. [id number] 2428w0d s/p NSVD. She is recovering well.  Plan for discharge tomorrow if patient continues to do well.    LOS: 1 day   Fleeta EmmerMichael Cunningham 01/24/2015, 9:57 AM   CNM attestation Post Partum Day #1 I have seen and examined this patient.  Amiayah Marni GriffonS Prescher is a 24 y.o. [id number] s/p SVD.  Pt denies problems with ambulating, voiding or po intake. Pain is well controlled.  Plan for birth control is interval BTL.  Method of Feeding: bottle  PE:  BP 128/75 mmHg  Pulse 83  Temp(Src) 97.8 F (36.6 C) (Oral)  Resp 18  Ht 5\' 5"  (1.651 m)  Wt 95.709 kg (211 lb)  BMI 35.11 kg/m2  SpO2 100%  LMP 04/18/2014  Breastfeeding? Unknown Lungs: nl effort Heart: RRR Fundus firm  Plan for discharge: 01/25/15  Cam HaiSHAW, Martha Soltys, CNM 11:19 PM

## 2015-01-24 NOTE — Progress Notes (Signed)
Epidural catheter removed without complication, blue tip intact. Wolfgang PhoenixLeigha Jennene Downie RN 01/24/15 0200

## 2015-01-25 ENCOUNTER — Encounter: Payer: Self-pay | Admitting: Physician Assistant

## 2015-01-25 LAB — RPR: RPR: NONREACTIVE

## 2015-01-25 MED ORDER — IBUPROFEN 600 MG PO TABS: 600.0000 mg | ORAL_TABLET | Freq: Four times a day (QID) | ORAL | Status: DC

## 2015-01-25 MED ORDER — SENNOSIDES-DOCUSATE SODIUM 8.6-50 MG PO TABS: 2.0000 | ORAL_TABLET | ORAL | Status: DC | PRN

## 2015-01-25 MED ORDER — PRENATAL MULTIVITAMIN CH: 1.0000 | ORAL_TABLET | Freq: Every day | ORAL | Status: DC

## 2015-01-25 NOTE — Discharge Instructions (Signed)
Pregnancy and Anemia Anemia is a condition in which the concentration of red blood cells or hemoglobin in the blood is below normal. Hemoglobin is a substance in red blood cells that carries oxygen to the tissues of the body. Anemia results in not enough oxygen reaching these tissues.  Anemia during pregnancy is common because the fetus uses more iron and folic acid as it is developing. Your body may not produce enough red blood cells because of this. Also, during pregnancy, the liquid part of the blood (plasma) increases by about 50%, and the red blood cells increase by only 25%. This lowers the concentration of the red blood cells and creates a natural anemia-like situation.  CAUSES  The most common cause of anemia during pregnancy is not having enough iron in the body to make red blood cells (iron deficiency anemia). Other causes may include:  Folic acid deficiency.  Vitamin B12 deficiency.  Certain prescription or over-the-counter medicines.  Certain medical conditions or infections that destroy red blood cells.  A low platelet count and bleeding caused by antibodies that go through the placenta to the fetus from the mother's blood. SIGNS AND SYMPTOMS  Mild anemia may not be noticeable. If it becomes severe, symptoms may include:  Tiredness.  Shortness of breath, especially with exercise.  Weakness.  Fainting.  Pale looking skin.  Headaches.  Feeling a fast or irregular heartbeat (palpitations). DIAGNOSIS  The type of anemia is usually diagnosed from your family and medical history and blood tests. TREATMENT  Treatment of anemia during pregnancy depends on the cause of the anemia. Treatment can include:  Supplements of iron, vitamin B12, or folic acid.  A blood transfusion. This may be needed if blood loss is severe.  Hospitalization. This may be needed if there is significant continual blood loss.  Dietary changes. HOME CARE INSTRUCTIONS   Follow your dietitian's or  health care provider's dietary recommendations.  Increase your vitamin C intake. This will help the stomach absorb more iron.  Eat a diet rich in iron. This would include foods such as:  Liver.  Beef.  Whole grain bread.  Eggs.  Dried fruit.  Take iron and vitamins as directed by your health care provider.  Eat green leafy vegetables. These are a good source of folic acid. SEEK MEDICAL CARE IF:   You have frequent or lasting headaches.  You are looking pale.  You are bruising easily. SEEK IMMEDIATE MEDICAL CARE IF:   You have extreme weakness, shortness of breath, or chest pain.  You become dizzy or have trouble concentrating.  You have heavy vaginal bleeding.  You develop a rash.  You have bloody or black, tarry stools.  You faint.  You vomit up blood.  You vomit repeatedly.  You have abdominal pain.  You have a fever or persistent symptoms for more than 2-3 days.  You have a fever and your symptoms suddenly get worse.  You are dehydrated. MAKE SURE YOU:   Understand these instructions.  Will watch your condition.  Will get help right away if you are not doing well or get worse. Document Released: 08/09/2000 Document Revised: 06/02/2013 Document Reviewed: 03/24/2013 [facility] Patient Information 2015 [place], [place]. This information is not intended to replace advice given to you by your health care provider. Make sure you discuss any questions you have with your health care provider.   Postpartum Care After Vaginal Delivery After you deliver your newborn (postpartum period), the usual stay in the hospital is 24-72 hours. If  there were problems with your labor or delivery, or if you have other medical problems, you might be in the hospital longer.  While you are in the hospital, you will receive help and instructions on how to care for yourself and your newborn during the postpartum period.  While you are in the hospital:  Be sure to tell your nurses  if you have pain or discomfort, as well as where you feel the pain and what makes the pain worse.  If you had an incision made near your vagina (episiotomy) or if you had some tearing during delivery, the nurses may put ice packs on your episiotomy or tear. The ice packs may help to reduce the pain and swelling.  If you are breastfeeding, you may feel uncomfortable contractions of your uterus for a couple of weeks. This is normal. The contractions help your uterus get back to normal size.  It is normal to have some bleeding after delivery.  For the first 1-3 days after delivery, the flow is red and the amount may be similar to a period.  It is common for the flow to start and stop.  In the first few days, you may pass some small clots. Let your nurses know if you begin to pass large clots or your flow increases.  Do not  flush blood clots down the toilet before having the nurse look at them.  During the next 3-10 days after delivery, your flow should become more watery and pink or brown-tinged in color.  Ten to fourteen days after delivery, your flow should be a small amount of yellowish-white discharge.  The amount of your flow will decrease over the first few weeks after delivery. Your flow may stop in 6-8 weeks. Most women have had their flow stop by 12 weeks after delivery.  You should change your sanitary pads frequently.  Wash your hands thoroughly with soap and water for at least 20 seconds after changing pads, using the toilet, or before holding or feeding your newborn.  You should feel like you need to empty your bladder within the first 6-8 hours after delivery.  In case you become weak, lightheaded, or faint, call your nurse before you get out of bed for the first time and before you take a shower for the first time.  Within the first few days after delivery, your breasts may begin to feel tender and full. This is called engorgement. Breast tenderness usually goes away within  48-72 hours after engorgement occurs. You may also notice milk leaking from your breasts. If you are not breastfeeding, do not stimulate your breasts. Breast stimulation can make your breasts produce more milk.  Spending as much time as possible with your newborn is very important. During this time, you and your newborn can feel close and get to know each other. Having your newborn stay in your room (rooming in) will help to strengthen the bond with your newborn. It will give you time to get to know your newborn and become comfortable caring for your newborn.  Your hormones change after delivery. Sometimes the hormone changes can temporarily cause you to feel sad or tearful. These feelings should not last more than a few days. If these feelings last longer than that, you should talk to your caregiver.  If desired, talk to your caregiver about methods of family planning or contraception.  Talk to your caregiver about immunizations. Your caregiver may want you to have the following immunizations before leaving the hospital:  Tetanus, diphtheria, and pertussis (Tdap) or tetanus and diphtheria (Td) immunization. It is very important that you and your family (including grandparents) or others caring for your newborn are up-to-date with the Tdap or Td immunizations. The Tdap or Td immunization can help protect your newborn from getting ill.  Rubella immunization.  Varicella (chickenpox) immunization.  Influenza immunization. You should receive this annual immunization if you did not receive the immunization during your pregnancy. Document Released: 06/09/2007 Document Revised: 05/06/2012 Document Reviewed: 04/08/2012 [facility] Patient Information 2015 [place], [place]. This information is not intended to replace advice given to you by your health care provider. Make sure you discuss any questions you have with your health care provider.  Laparoscopic Tubal Ligation Laparoscopic tubal ligation is a  procedure that closes the fallopian tubes at a time other than right after childbirth. By closing the fallopian tubes, the eggs that are released from the ovaries cannot enter the uterus and sperm cannot reach the egg. Tubal ligation is also known as getting your "tubes tied." Tubal ligation is done so you will not be able to get pregnant or have a baby.  Although this procedure may be reversed, it should be considered permanent and irreversible. If you want to have future pregnancies, you should not have this procedure.  LET YOUR CAREGIVER KNOW ABOUT:  Allergies to food or medicine.  Medicines taken, including vitamins, herbs, eyedrops, over-the-counter medicines, and creams.  Use of steroids (by mouth or creams).  Previous problems with numbing medicines.  History of bleeding problems or blood clots.  Any recent colds or infections.  Previous surgery.  Other health problems, including diabetes and kidney problems.  Possibility of pregnancy, if this applies.  Any past pregnancies. RISKS AND COMPLICATIONS   Infection.  Bleeding.  Injury to surrounding organs.  Anesthetic side effects.  Failure of the procedure.  Ectopic pregnancy.  Future regret about having the procedure done. BEFORE THE PROCEDURE  Do not take aspirin or blood thinners a week before the procedure or as directed. This can cause bleeding.  Do not eat or drink anything 6 to 8 hours before the procedure. PROCEDURE   You may be given a medicine to help you relax (sedative) before the procedure. You will be given a medicine to make you sleep (general anesthetic) during the procedure.  A tube will be put down your throat to help your breath while under general anesthesia.  Two small cuts (incisions) are made in the lower abdominal area and near the belly button.  Your abdominal area will be inflated with a safe gas (carbon dioxide). This helps give the surgeon room to operate, visualize, and helps the  surgeon avoid other organs.  A thin, lighted tube (laparoscope) with a camera attached is inserted into your abdomen through one of the incisions near the belly button. Other small instruments are also inserted through the other abdominal incision.  The fallopian tubes are located and are either blocked with a ring, clip, or are burned (cauterized).  After the fallopian tubes are blocked, the gas is released from the abdomen.  The incisions will be closed with stitches (sutures), and a bandage may be placed over the incisions. AFTER THE PROCEDURE   You will rest in a recovery room for 1--4 hours until you are stable and doing well.  You will also have some mild abdominal discomfort for 3--7 days. You will be given pain medicine to ease any discomfort.  As long as there are no problems, you may be  allowed to go home. Someone will need to drive you home and be with you for at least 24 hours once home.  You may have some mild discomfort in the throat. This is from the tube placed in your throat while you were sleeping.  You may experience discomfort in the shoulder area from some trapped air between the liver and diaphragm. This sensation is normal and will slowly go away on its own. Document Released: 11/18/2000 Document Revised: 02/11/2012 Document Reviewed: 11/23/2011 [facility] Patient Information 2015 [place], [place]. This information is not intended to replace advice given to you by your health care provider. Make sure you discuss any questions you have with your health care provider.

## 2015-01-25 NOTE — Discharge Summary (Signed)
Obstetric Discharge Summary Reason for Admission: rupture of membranes Prenatal Procedures: NST and ultrasound Intrapartum Procedures: spontaneous vaginal delivery Postpartum Procedures: none Complications-Operative and Postpartum: none  Delivery Summary At 11:44 PM a viable female was delivered via Vaginal, Spontaneous Delivery (Presentation: ; Occiput Anterior). APGAR: 8, 9; weight (pending).Placenta status: Intact, Spontaneous. Cord: 3v with the following complications: None.  Anesthesia: Epidural  Episiotomy: None Lacerations: Superficial perineal Suture Repair: None Est. Blood Loss (mL): 200cc  Upon arrival to room patient was complete and ready to push, had received epidural about 1 hr prior still with discomfort. She pushed with good maternal effort to deliver a healthy baby boy, delivered without significant difficulty, some decreased maternal effort with pushing due to pain after delivery of head but continued good progress. Loose nuchal x 1, reduced following delivery, good tone and place on maternal abdomen for oral suctioning, drying and stimulation. Delayed cord clamping performed and cut by Family. Placenta delivered intact with 3V cord. Vaginal canal and perineum was inspected and with superficial perineal laceration, hemostatic without req suture. Pitocin was started and uterus massaged until bleeding slowed. Counts of sharps, instruments, and lap pads were all correct.    Hospital Course: Active Problems:   Normal pregnancy  [name] [name] is a 24 y.o. [id number] s/p NSVD. Patient was admitted for ROM.  She has postpartum course that was uncomplicated including no problems with ambulating, PO intake, urination, pain, or bleeding. The pt feels ready to go home and  will be discharged with outpatient follow-up.   Today: No acute events overnight.  Pt denies problems with ambulating, voiding or po intake.  She denies nausea or vomiting.  Pain is well controlled.  She has  had flatus. She has not had bowel movement.  Lochia Small.  Plan for birth control is bilateral tubal ligation.  Method of Feeding: Bottle  Physical Exam:  General: alert, cooperative and no distress Lochia: appropriate Uterine Fundus: firm DVT Evaluation: No evidence of DVT seen on physical exam. Negative Homan's sign.  H/H: Lab Results  Component Value Date/Time   HGB 9.1* 01/24/2015 05:40 AM   HGB 8.7 08/09/2013   HCT 28.6* 01/24/2015 05:40 AM   HCT 26 08/09/2013    Discharge Diagnoses: Term Pregnancy-delivered  Discharge Information: Date: 01/25/2015 Activity: pelvic rest Diet: routine  Medications: PNV, Ibuprofen and Colace Breast feeding:  No: Bottle Condition: stable Instructions: refer to handout Discharge to: home     Medication List    TAKE these medications        ibuprofen 600 MG tablet  Commonly known as:  ADVIL,MOTRIN  Take 1 tablet (600 mg total) by mouth every 6 (six) hours.     prenatal multivitamin Tabs tablet  Take 1 tablet by mouth daily at 12 noon.     senna-docusate 8.6-50 MG per tablet  Commonly known as:  Senokot-S  Take 2 tablets by mouth as needed for mild constipation or moderate constipation.       Follow-up Information    [facility] Schedule an appointment as soon as possible for a visit in 2 weeks.   Specialty:  Obstetrics and Gynecology   Why:  for post-partum visit, pre-op visit   Contact information:   [address] [place] [id number] [phone]      Caryl AdaJazma Phelps, DO 01/25/2015, 7:49 AM PGY-1, [facility]  I was present for the exam and agree with above. PO iron.   [place] Romell Wolden, [place] 01/25/2015  9:13 AM

## 2015-01-26 LAB — TYPE AND SCREEN
ABO/RH(D): A POS
Antibody Screen: POSITIVE
DAT, IgG: NEGATIVE
PT AG Type: NEGATIVE
Unit division: 0
Unit division: 0

## 2015-02-08 ENCOUNTER — Encounter: Payer: Medicaid Other | Admitting: Obstetrics & Gynecology

## 2015-02-09 ENCOUNTER — Ambulatory Visit: Payer: Medicaid Other | Admitting: Obstetrics and Gynecology

## 2015-02-09 VITALS — BP 134/86 | HR 90 | Temp 98.0°F | Ht 65.0 in | Wt 191.0 lb

## 2015-02-09 DIAGNOSIS — Z3009 Encounter for other general counseling and advice on contraception: Secondary | ICD-10-CM

## 2015-02-09 NOTE — Progress Notes (Signed)
Patient ID: [name] [name], female   DOB: [birth year], 24 y.o.   MRN: [id number] 24 yo [id number] here to schedule a BTL. Patient had signed her BTL papers in May but delivered shortly after. She comes today still desiring permanent sterilization. I discussed with her at length the permanence of the procedure and the level of regret associated with having the procedure done at such a young age. Long term, reversible forms of birth control were also discussed and reviewed. Patient wishes to proceed. Risks, benefits and alternatives were reviewed and explained to the patient, including but not limited to, risks of bleeding, infection and damage to adjacent organs. She will be scheduled for a laparoscopic bilateral tubal ligation.

## 2015-03-01 ENCOUNTER — Encounter: Payer: Self-pay | Admitting: Obstetrics & Gynecology

## 2015-03-01 ENCOUNTER — Ambulatory Visit (INDEPENDENT_AMBULATORY_CARE_PROVIDER_SITE_OTHER): Payer: Medicaid Other | Admitting: Obstetrics & Gynecology

## 2015-03-01 DIAGNOSIS — F53 Postpartum depression: Secondary | ICD-10-CM

## 2015-03-01 DIAGNOSIS — O99345 Other mental disorders complicating the puerperium: Secondary | ICD-10-CM

## 2015-03-01 MED ORDER — SERTRALINE HCL 50 MG PO TABS: 50.0000 mg | ORAL_TABLET | Freq: Every day | ORAL | Status: DC

## 2015-03-01 NOTE — Progress Notes (Signed)
Subjective:     [name] [name] is a 24 y.o. [id number] female who presents for a postpartum visit. She is 5 weeks postpartum following a vaginal delivery. I have fully reviewed the prenatal and intrapartum course. The delivery was at 40 gestational weeks. Outcome: NSVD w/pitocin augmentation.  Anesthesia: epidural . Postpartum course has been unremarkable. Baby's course has been normal. Baby is feeding by bottle/formula. Bleeding - none.  Bowel function is normal. Bladder function is normal. Patient is not sexually active. Contraception method is desires BTS.  Postpartum depression screening: 3 but reports feeling sad most of the time, denies HI or SI.  Patient states she has not felt like this after her other pregnancies.  Does not have any support with baby. Wants antidepressant therapy, does not want to talk to counselor or want [facility].     The following portions of the patient's history were reviewed and updated as appropriate: allergies, current medications, past family history, past medical history, past social history, past surgical history and problem list.  Last pap was 08/25/14 and was negative.  Review of Systems Pertinent items are noted in HPI.   Objective:    BP 111/66 mmHg  Pulse 98  Temp(Src) 98 F (36.7 C) (Oral)  Resp 20  Ht 5\' 3"  (1.6 m)  Wt 184 lb 12.8 oz (83.825 kg)  BMI 32.74 kg/m2  General:  alert and no distress   Breasts:  deferred  Lungs: clear to auscultation bilaterally  Heart:  regular rate and rhythm  Abdomen: soft, non-tender; bowel sounds normal; no masses,  no organomegaly  Pelvic:  deferred        Assessment:   Postpartum depression Desires sterilization  Plan:   Prescribed Zoloft 50 mg po daily, SI/HI precautions advised.  Will follow up in about a month. Patient desires bilateral tubal sterilization.  Other reversible forms of contraception were discussed with patient; she declines all other modalities. Discussed laparoscopic  bilateral tubal sterilization using Filshie clips.  Risks of procedure discussed with patient including but not limited to: risk of regret, permanence of method, bleeding, infection, injury to surrounding organs and need for additional procedures.  Failure risk of 1-2 % with increased risk of ectopic gestation if pregnancy occurs was also discussed with patient.  Patient verbalized understanding of these risks and wants to proceed with sterilization.  She was told that she will be contacted by our surgical scheduler regarding the time and date of her surgery; routine preoperative instructions of having nothing to eat or drink after midnight on the day prior to surgery and also coming to the hospital 1 1/2 hours prior to her time of surgery were also emphasized.  She was told she may be called for a preoperative appointment about a week prior to surgery and will be given further preoperative instructions at that visit.  Routine postoperative instructions will be reviewed with the patient and her family in detail after surgery. Printed patient education handouts about the procedure was given to the patient to review at home.  Medicaid papers signed 01/18/15.  In the meantime, patient will use condoms for contraception prior to surgery.   Jaynie CollinsUGONNA  [place] Pandey, MD, FACOG Attending Obstetrician & Gynecologist Center for [facility], [facility]

## 2015-03-02 ENCOUNTER — Encounter (HOSPITAL_COMMUNITY): Payer: Self-pay | Admitting: *Deleted

## 2015-03-16 ENCOUNTER — Encounter (HOSPITAL_COMMUNITY)
Admission: RE | Disposition: A | Discharge status: Home / Self Care | Payer: Self-pay | Source: Ambulatory Visit | Attending: Obstetrics & Gynecology

## 2015-03-16 ENCOUNTER — Ambulatory Visit (HOSPITAL_COMMUNITY): Payer: Medicaid Other | Admitting: Anesthesiology

## 2015-03-16 ENCOUNTER — Ambulatory Visit (HOSPITAL_COMMUNITY)
Admission: RE | Admit: 2015-03-16 | Discharge: 2015-03-16 | Disposition: A | Discharge status: Home / Self Care | Payer: Medicaid Other | Source: Ambulatory Visit | Attending: Obstetrics & Gynecology | Admitting: Obstetrics & Gynecology

## 2015-03-16 ENCOUNTER — Encounter (HOSPITAL_COMMUNITY): Payer: Self-pay

## 2015-03-16 DIAGNOSIS — Z302 Encounter for sterilization: Secondary | ICD-10-CM | POA: Insufficient documentation

## 2015-03-16 HISTORY — PX: LAPAROSCOPIC TUBAL LIGATION: SHX1937

## 2015-03-16 LAB — CBC
HCT: 36.4 % (ref 36.0–46.0)
Hemoglobin: 11.8 g/dL — ABNORMAL LOW (ref 12.0–15.0)
MCH: 27.6 pg (ref 26.0–34.0)
MCHC: 32.4 g/dL (ref 30.0–36.0)
MCV: 85.2 fL (ref 78.0–100.0)
PLATELETS: 264 10*3/uL (ref 150–400)
RBC: 4.27 MIL/uL (ref 3.87–5.11)
RDW: 17.7 % — ABNORMAL HIGH (ref 11.5–15.5)
WBC: 6.1 10*3/uL (ref 4.0–10.5)

## 2015-03-16 LAB — PREGNANCY, URINE: Preg Test, Ur: NEGATIVE

## 2015-03-16 SURGERY — LIGATION, FALLOPIAN TUBE, LAPAROSCOPIC
Anesthesia: General | Site: Abdomen | Laterality: Bilateral

## 2015-03-16 MED ORDER — LACTATED RINGERS IV SOLN
INTRAVENOUS | Status: DC
  Administered 2015-03-16: 12:00:00 via INTRAVENOUS

## 2015-03-16 MED ORDER — FENTANYL CITRATE (PF) 100 MCG/2ML IJ SOLN
INTRAMUSCULAR | Status: DC | PRN
  Administered 2015-03-16: 50 ug via INTRAVENOUS
  Administered 2015-03-16: 100 ug via INTRAVENOUS

## 2015-03-16 MED ORDER — ACETAMINOPHEN 160 MG/5ML PO SOLN
975.0000 mg | Freq: Once | ORAL | Status: AC
  Administered 2015-03-16: 975 mg via ORAL

## 2015-03-16 MED ORDER — IBUPROFEN 600 MG PO TABS: 600.0000 mg | ORAL_TABLET | Freq: Four times a day (QID) | ORAL | Status: DC | PRN

## 2015-03-16 MED ORDER — FENTANYL CITRATE (PF) 100 MCG/2ML IJ SOLN
INTRAMUSCULAR | Status: AC
  Administered 2015-03-16: 25 ug via INTRAVENOUS
  Filled 2015-03-16: qty 2

## 2015-03-16 MED ORDER — ROCURONIUM BROMIDE 100 MG/10ML IV SOLN
INTRAVENOUS | Status: DC | PRN
  Administered 2015-03-16: 30 mg via INTRAVENOUS

## 2015-03-16 MED ORDER — PROPOFOL 10 MG/ML IV BOLUS
INTRAVENOUS | Status: DC | PRN
  Administered 2015-03-16: 200 mg via INTRAVENOUS

## 2015-03-16 MED ORDER — ACETAMINOPHEN 160 MG/5ML PO SOLN
ORAL | Status: AC
  Administered 2015-03-16: 975 mg via ORAL
  Filled 2015-03-16: qty 40.6

## 2015-03-16 MED ORDER — LACTATED RINGERS IV SOLN: INTRAVENOUS | Status: DC

## 2015-03-16 MED ORDER — SCOPOLAMINE 1 MG/3DAYS TD PT72
MEDICATED_PATCH | TRANSDERMAL | Status: AC
  Filled 2015-03-16: qty 1

## 2015-03-16 MED ORDER — MIDAZOLAM HCL 2 MG/2ML IJ SOLN
INTRAMUSCULAR | Status: AC
  Filled 2015-03-16: qty 2

## 2015-03-16 MED ORDER — FENTANYL CITRATE (PF) 100 MCG/2ML IJ SOLN
25.0000 ug | INTRAMUSCULAR | Status: DC | PRN
  Administered 2015-03-16 (×2): 25 ug via INTRAVENOUS

## 2015-03-16 MED ORDER — OXYCODONE-ACETAMINOPHEN 5-325 MG PO TABS
ORAL_TABLET | ORAL | Status: AC
  Filled 2015-03-16: qty 1

## 2015-03-16 MED ORDER — BUPIVACAINE HCL (PF) 0.5 % IJ SOLN
INTRAMUSCULAR | Status: AC
  Filled 2015-03-16: qty 30

## 2015-03-16 MED ORDER — LIDOCAINE HCL (CARDIAC) 20 MG/ML IV SOLN
INTRAVENOUS | Status: AC
  Filled 2015-03-16: qty 5

## 2015-03-16 MED ORDER — FENTANYL CITRATE (PF) 250 MCG/5ML IJ SOLN
INTRAMUSCULAR | Status: AC
  Filled 2015-03-16: qty 5

## 2015-03-16 MED ORDER — SCOPOLAMINE 1 MG/3DAYS TD PT72
1.0000 | MEDICATED_PATCH | Freq: Once | TRANSDERMAL | Status: DC
  Administered 2015-03-16: 1.5 mg via TRANSDERMAL

## 2015-03-16 MED ORDER — DEXAMETHASONE SODIUM PHOSPHATE 10 MG/ML IJ SOLN
INTRAMUSCULAR | Status: DC | PRN
  Administered 2015-03-16: 4 mg via INTRAVENOUS

## 2015-03-16 MED ORDER — LIDOCAINE HCL (CARDIAC) 20 MG/ML IV SOLN
INTRAVENOUS | Status: DC | PRN
  Administered 2015-03-16: 50 mg via INTRAVENOUS

## 2015-03-16 MED ORDER — KETOROLAC TROMETHAMINE 30 MG/ML IJ SOLN
INTRAMUSCULAR | Status: DC | PRN
  Administered 2015-03-16: 30 mg via INTRAVENOUS

## 2015-03-16 MED ORDER — DOCUSATE SODIUM 100 MG PO CAPS: 100.0000 mg | ORAL_CAPSULE | Freq: Two times a day (BID) | ORAL | Status: DC | PRN

## 2015-03-16 MED ORDER — DEXAMETHASONE SODIUM PHOSPHATE 4 MG/ML IJ SOLN
INTRAMUSCULAR | Status: AC
  Filled 2015-03-16: qty 1

## 2015-03-16 MED ORDER — ONDANSETRON HCL 4 MG/2ML IJ SOLN
INTRAMUSCULAR | Status: DC | PRN
  Administered 2015-03-16: 4 mg via INTRAVENOUS

## 2015-03-16 MED ORDER — MIDAZOLAM HCL 2 MG/2ML IJ SOLN
INTRAMUSCULAR | Status: DC | PRN
  Administered 2015-03-16: 2 mg via INTRAVENOUS

## 2015-03-16 MED ORDER — ONDANSETRON HCL 4 MG/2ML IJ SOLN
INTRAMUSCULAR | Status: AC
  Filled 2015-03-16: qty 2

## 2015-03-16 MED ORDER — BUPIVACAINE HCL (PF) 0.5 % IJ SOLN
INTRAMUSCULAR | Status: DC | PRN
  Administered 2015-03-16: 26 mL
  Administered 2015-03-16: 4 mL

## 2015-03-16 MED ORDER — PROPOFOL 10 MG/ML IV BOLUS
INTRAVENOUS | Status: AC
  Filled 2015-03-16: qty 20

## 2015-03-16 MED ORDER — OXYCODONE-ACETAMINOPHEN 5-325 MG PO TABS
1.0000 | ORAL_TABLET | ORAL | Status: DC | PRN
  Administered 2015-03-16: 1 via ORAL

## 2015-03-16 MED ORDER — NEOSTIGMINE METHYLSULFATE 10 MG/10ML IV SOLN
INTRAVENOUS | Status: DC | PRN
  Administered 2015-03-16: 3 mg via INTRAVENOUS

## 2015-03-16 MED ORDER — GLYCOPYRROLATE 0.2 MG/ML IJ SOLN
INTRAMUSCULAR | Status: DC | PRN
  Administered 2015-03-16: 0.4 mg via INTRAVENOUS

## 2015-03-16 MED ORDER — OXYCODONE-ACETAMINOPHEN 5-325 MG PO TABS: 1.0000 | ORAL_TABLET | Freq: Four times a day (QID) | ORAL | Status: DC | PRN

## 2015-03-16 SURGICAL SUPPLY — 22 items
CATH ROBINSON RED A/P 16FR (CATHETERS) ×3 IMPLANT
CLIP FILSHIE TUBAL LIGA STRL (Clip) ×3 IMPLANT
CLOTH BEACON ORANGE TIMEOUT ST (SAFETY) ×3 IMPLANT
DRESSING OPSITE X SMALL 2X3 (GAUZE/BANDAGES/DRESSINGS) ×3 IMPLANT
DRSG COVADERM PLUS 2X2 (GAUZE/BANDAGES/DRESSINGS) ×6 IMPLANT
DRSG OPSITE POSTOP 3X4 (GAUZE/BANDAGES/DRESSINGS) ×3 IMPLANT
DURAPREP 26ML APPLICATOR (WOUND CARE) ×3 IMPLANT
GLOVE BIOGEL PI IND STRL 7.0 (GLOVE) ×2 IMPLANT
GLOVE BIOGEL PI INDICATOR 7.0 (GLOVE) ×4
GLOVE ECLIPSE 7.0 STRL STRAW (GLOVE) ×3 IMPLANT
GOWN STRL REUS W/TWL LRG LVL3 (GOWN DISPOSABLE) ×6 IMPLANT
LIQUID BAND (GAUZE/BANDAGES/DRESSINGS) ×3 IMPLANT
NEEDLE HYPO 22GX1.5 SAFETY (NEEDLE) ×3 IMPLANT
NEEDLE INSUFFLATION 120MM (ENDOMECHANICALS) ×3 IMPLANT
PACK LAPAROSCOPY BASIN (CUSTOM PROCEDURE TRAY) ×3 IMPLANT
PAD POSITIONER PINK NONSTERILE (MISCELLANEOUS) ×3 IMPLANT
SUT VIC AB 3-0 X1 27 (SUTURE) ×3 IMPLANT
SUT VICRYL 0 UR6 27IN ABS (SUTURE) ×6 IMPLANT
SYR 30ML LL (SYRINGE) ×3 IMPLANT
TOWEL OR 17X24 6PK STRL BLUE (TOWEL DISPOSABLE) ×6 IMPLANT
TROCAR XCEL NON-BLD 11X100MML (ENDOMECHANICALS) ×3 IMPLANT
WATER STERILE IRR 1000ML POUR (IV SOLUTION) ×3 IMPLANT

## 2015-03-16 NOTE — Op Note (Signed)
[name] [name] 03/16/2015  PREOPERATIVE DIAGNOSIS:  Undesired fertility  POSTOPERATIVE DIAGNOSIS:  Undesired fertility  PROCEDURE:  Laparoscopic Bilateral Tubal Sterilization using Filshie Clips   SURGEON: Jaynie Collins, MD  ANESTHESIA:  General endotracheal  COMPLICATIONS:  None immediate.  ESTIMATED BLOOD LOSS:  5 ml.  FLUIDS: 1000 ml LR.  URINE OUTPUT:  30 ml of clear urine.  INDICATIONS: 24 y.o. [id number]  with undesired fertility, desires permanent sterilization. Other reversible forms of contraception were discussed with patient; she declines all other modalities.  Risks of procedure discussed with patient including permanence of method, bleeding, infection, injury to surrounding organs and need for additional procedures including laparotomy, risk of regret.  Failure risk of 0.5-1% with increased risk of ectopic gestation if pregnancy occurs was also discussed with patient.      FINDINGS:  Normal uterus, tubes, and ovaries. Normal upper abdomen.  TECHNIQUE:  The patient was taken to the operating room where general anesthesia was obtained without difficulty.  She was then placed in the dorsal lithotomy position and prepared and draped in sterile fashion.  After an adequate timeout was performed, a bivalved speculum was then placed in the patient's vagina, and the anterior lip of cervix grasped with the single-tooth tenaculum.  The uterine manipulator was then advanced into the uterus.  The speculum was removed from the vagina.  Attention was then turned to the patient's abdomen where a 11-mm skin incision was made in the umbilical fold.  The Optiview 11-mm trocar and sleeve were then advanced without difficulty with the laparoscope under direct visualization into the abdomen.  The abdomen was then insufflated with carbon dioxide gas and adequate pneumoperitoneum was obtained.  A survey of the patient's pelvis and abdomen revealed entirely normal anatomy.  The fallopian tubes were  observed and found to be normal in appearance. The Filshie clip applicator was placed through the operative port, and a Filshie clip was placed on the right fallopian tube ,about 2 cm from the cornual attachment, with care given to incorporate the underlying mesosalpinx.  A similar process was carried out on the contralateral side allowing for bilateral tubal sterilization.   Good hemostasis was noted overall.  Local analgesia was drizzled on both operative sites.The instruments were then removed from the patient's abdomen and the fascial incision was repaired with 0 Vicryl, and the skin was closed with Dermabond.  The uterine manipulator and the tenaculum were removed from the vagina without complications. The patient tolerated the procedure well.  Sponge, lap, and needle counts were correct times two.  The patient was then taken to the recovery room awake, extubated and in stable condition.  The patient will be discharged to home as per PACU criteria.  Routine postoperative instructions given.  She was prescribed Percocet, Ibuprofen and Colace.  She will follow up in the clinic for postoperative evaluation .   Jaynie Collins, MD, FACOG Attending Obstetrician & Gynecologist Faculty Practice, [facility]

## 2015-03-16 NOTE — Interval H&P Note (Signed)
History and Physical Interval Note 03/16/2015 12:30 PM  [name] [name]  has presented today for surgery, with the diagnosis of undesired fertility.  Patient desires permanent sterilization.  Other reversible forms of contraception were discussed with patient and especially emphasized IUDs; she declines all other modalities. Risks of procedure discussed with patient including but not limited to: risk of regret, permanence of method, bleeding, infection, injury to surrounding organs and need for additional procedures including laparotomy if indicated.  Failure risk of 1-2 % with increased risk of ectopic gestation if pregnancy occurs was also discussed with patient.  Patient verbalized understanding of these risks and wants to proceed with sterilization.  After consideration of risks, benefits and other options for treatment, the patient has consented to LAPAROSCOPIC TUBAL LIGATION (Bilateral) with Filshie clips as a surgical intervention .  The patient's history has been reviewed, patient examined, no change in status, stable for surgery.  I have reviewed the patient's chart and labs.  Questions were answered to the patient's satisfaction.  To OR when ready.   Jaynie Collins, MD, FACOG Attending Obstetrician & Gynecologist Faculty Practice, [facility]

## 2015-03-16 NOTE — Anesthesia Procedure Notes (Signed)
Procedure Name: Intubation Date/Time: 03/16/2015 12:58 PM Performed by: Shanon Payor Pre-anesthesia Checklist: Patient identified, Emergency Drugs available, Suction available, Patient being monitored and Timeout performed Patient Re-evaluated:Patient Re-evaluated prior to inductionOxygen Delivery Method: Circle system utilized Preoxygenation: Pre-oxygenation with 100% oxygen Intubation Type: IV induction Ventilation: Mask ventilation without difficulty Tube type: Oral Tube size: 7.0 mm Number of attempts: 1 Airway Equipment and Method: Lighted stylet Placement Confirmation: positive ETCO2 and breath sounds checked- equal and bilateral Secured at: 21 cm Tube secured with: Tape Dental Injury: Teeth and Oropharynx as per pre-operative assessment

## 2015-03-16 NOTE — Anesthesia Preprocedure Evaluation (Signed)
Anesthesia Evaluation  Patient identified by MRN, date of birth, ID band Patient awake    Reviewed: Allergy & Precautions, H&P , Patient's Chart, lab work & pertinent test results, reviewed documented beta blocker date and time   Airway Mallampati: II  TM Distance: >3 FB Neck ROM: full    Dental no notable dental hx.    Pulmonary  breath sounds clear to auscultation  Pulmonary exam normal       Cardiovascular Rhythm:regular Rate:Normal     Neuro/Psych    GI/Hepatic   Endo/Other    Renal/GU      Musculoskeletal   Abdominal   Peds  Hematology   Anesthesia Other Findings   Reproductive/Obstetrics                             Anesthesia Physical Anesthesia Plan  ASA: II  Anesthesia Plan: General   Post-op Pain Management:    Induction: Intravenous  Airway Management Planned: Oral ETT  Additional Equipment:   Intra-op Plan:   Post-operative Plan: Extubation in OR  Informed Consent: I have reviewed the patients History and Physical, chart, labs and discussed the procedure including the risks, benefits and alternatives for the proposed anesthesia with the patient or authorized representative who has indicated his/her understanding and acceptance.   Dental Advisory Given and Dental advisory given  Plan Discussed with: CRNA and Surgeon  Anesthesia Plan Comments: (  Discussed general anesthesia, including possible nausea, instrumentation of airway, sore throat,pulmonary aspiration, etc. I asked if the were any outstanding questions, or  concerns before we proceeded. )        Anesthesia Quick Evaluation  

## 2015-03-16 NOTE — H&P (View-Only) (Signed)
Subjective:     [name] [name] is a 24 y.o. G5P4014 female who presents for a postpartum visit. She is 5 weeks postpartum following a vaginal delivery. I have fully reviewed the prenatal and intrapartum course. The delivery was at 40 gestational weeks. Outcome: NSVD w/pitocin augmentation.  Anesthesia: epidural . Postpartum course has been unremarkable. Baby's course has been normal. Baby is feeding by bottle/formula. Bleeding - none.  Bowel function is normal. Bladder function is normal. Patient is not sexually active. Contraception method is desires BTS.  Postpartum depression screening: 3 but reports feeling sad most of the time, denies HI or SI.  Patient states she has not felt like this after her other pregnancies.  Does not have any support with baby. Wants antidepressant therapy, does not want to talk to counselor or want referral to [facility].     The following portions of the patient's history were reviewed and updated as appropriate: allergies, current medications, past family history, past medical history, past social history, past surgical history and problem list.  Last pap was 08/25/14 and was negative.  Review of Systems Pertinent items are noted in HPI.   Objective:    BP 111/66 mmHg  Pulse 98  Temp(Src) 98 F (36.7 C) (Oral)  Resp 20  Ht 5' 3" (1.6 m)  Wt 184 lb 12.8 oz (83.825 kg)  BMI 32.74 kg/m2  General:  alert and no distress   Breasts:  deferred  Lungs: clear to auscultation bilaterally  Heart:  regular rate and rhythm  Abdomen: soft, non-tender; bowel sounds normal; no masses,  no organomegaly  Pelvic:  deferred        Assessment:   Postpartum depression Desires sterilization  Plan:   Prescribed Zoloft 50 mg po daily, SI/HI precautions advised.  Will follow up in about a month. Patient desires bilateral tubal sterilization.  Other reversible forms of contraception were discussed with patient; she declines all other modalities. Discussed laparoscopic  bilateral tubal sterilization using Filshie clips.  Risks of procedure discussed with patient including but not limited to: risk of regret, permanence of method, bleeding, infection, injury to surrounding organs and need for additional procedures.  Failure risk of 1-2 % with increased risk of ectopic gestation if pregnancy occurs was also discussed with patient.  Patient verbalized understanding of these risks and wants to proceed with sterilization.  She was told that she will be contacted by our surgical scheduler regarding the time and date of her surgery; routine preoperative instructions of having nothing to eat or drink after midnight on the day prior to surgery and also coming to the hospital 1 1/2 hours prior to her time of surgery were also emphasized.  She was told she may be called for a preoperative appointment about a week prior to surgery and will be given further preoperative instructions at that visit.  Routine postoperative instructions will be reviewed with the patient and her family in detail after surgery. Printed patient education handouts about the procedure was given to the patient to review at home.  Medicaid papers signed 01/18/15.  In the meantime, patient will use condoms for contraception prior to surgery.   Hady Niemczyk, MD, FACOG Attending Obstetrician & Gynecologist [facility]  

## 2015-03-16 NOTE — Anesthesia Postprocedure Evaluation (Signed)
  Anesthesia Post-op Note  Patient: [name] [name]  Procedure(s) Performed: Procedure(s) (LRB): LAPAROSCOPIC TUBAL LIGATION WITH FILSHIE CLIPS (Bilateral)  Patient Location: PACU  Anesthesia Type: General  Level of Consciousness: awake and alert   Airway and Oxygen Therapy: Patient Spontanous Breathing  Post-op Pain: mild  Post-op Assessment: Post-op Vital signs reviewed, Patient's Cardiovascular Status Stable, Respiratory Function Stable, Patent Airway and No signs of Nausea or vomiting  Last Vitals:  Filed Vitals:   03/16/15 1545  BP: 121/65  Pulse: 78  Temp: 36.7 C  Resp: 20    Post-op Vital Signs: stable   Complications: No apparent anesthesia complications

## 2015-03-16 NOTE — Discharge Instructions (Signed)
DISCHARGE INSTRUCTIONS: Laparoscopy  The following instructions have been prepared to help you care for yourself upon your return home today.  May remove Scop patch on or before 03/18/2015  May take Ibuprofen after 7:15 pm as needed for pain.  Wound care:  Do not get the incision wet for the first 24 hours. The incision should be kept clean and dry.  The Band-Aids or dressings may be removed the day after surgery.  Should the incision become sore, red, and swollen after the first week, check with your doctor.  Personal hygiene:  Shower the day after your procedure.  Activity and limitations:  Do NOT drive or operate any equipment today.  Do NOT lift anything more than 15 pounds for 2-3 weeks after surgery.  Do NOT rest in bed all day.  Walking is encouraged. Walk each day, starting slowly with 5-minute walks 3 or 4 times a day. Slowly increase the length of your walks.  Walk up and down stairs slowly.  Do NOT do strenuous activities, such as golfing, playing tennis, bowling, running, biking, weight lifting, gardening, mowing, or vacuuming for 2-4 weeks. Ask your doctor when it is okay to start.  Diet: Eat a light meal as desired this evening. You may resume your usual diet tomorrow.  Return to work: This is dependent on the type of work you do. For the most part you can return to a desk job within a week of surgery. If you are more active at work, please discuss this with your doctor.  What to expect after your surgery: You may have a slight burning sensation when you urinate on the first day. You may have a very small amount of blood in the urine. Expect to have a small amount of vaginal discharge/light bleeding for 1-2 weeks. It is not unusual to have abdominal soreness and bruising for up to 2 weeks. You may be tired and need more rest for about 1 week. You may experience shoulder pain for 24-72 hours. Lying flat in bed may relieve it.  Call your doctor for any of the  following:  Develop a fever of 100.4 or greater  Inability to urinate 6 hours after discharge from hospital  Severe pain not relieved by pain medications  Persistent of heavy bleeding at incision site  Redness or swelling around incision site after a week  Increasing nausea or vomiting    Laparoscopic Surgery - Care After Laparoscopy is a surgical procedure. It is used to diagnose and treat diseases inside the belly(abdomen). It is usually a brief, common, and relatively simple procedure. The laparoscopeis a thin, lighted, pencil-sized instrument. It is like a telescope. It is inserted into your abdomen through a small cut (incision). Your caregiver can look at the organs inside your body through this instrument.  She can see if there is anything abnormal. Laparoscopy can be done either in a hospital or outpatient clinic. You may be given a mild sedative to help you relax before the procedure. Once in the operating room, you will be given a drug to make you sleep (general anesthesia). Laparoscopy usually lasts about 1 hour. After the procedure, you will be monitored in a recovery area until you are stable and doing well. Once you are home, it may take 3 to 7 days to fully recover.   Laparoscopy has relatively few risks. Your caregiver will discuss the risks with you before the procedure. Some problems that can occur include: RISKS AND COMPLICATIONS   Allergies to medicines.  Difficulty breathing.  Bleeding.  Infection.  Damage to other surrounding structures HOME CARE INSTRUCTIONS   Infection.  Bleeding.  Damage to other organs.  Anesthetic side effects.   Need for additional procedures such as open procedures/laparotomy PROCEDURE Once you receive anesthesia, your surgeon inflates the abdomen with a harmless gas (carbon dioxide). This makes the organs easier to see. The laparoscope is inserted into the abdomen through a small incision. This allows your surgeon to see into  the abdomen. Other small instruments are also inserted into the abdomen through other small openings. Many surgeons attach a video camera to the laparoscope to enlarge the view. During a laparoscopy, the surgeon may be looking for inflammation, infection, or cancer.  The surgeon may also need to take out certain organs or take tissue samples (biopsies). The specimens are sent to a specialist in looking at cells and tissue samples (pathologist). The pathologist examines them under a microscope to help to diagnose or confirm a disease. AFTER THE PROCEDURE   The incisions are closed with stitches (sutures) and Dermabond. Because these incisions are small (usually less than 1/2 inch), there is usually minimal discomfort after the procedure. There may also be discomfort from the instrument placement incisions in the abdomen. You will be given pain medicine to ease any discomfort.  You will rest in a recovery room for 1-2 hours until you are stable and doing well.  You may have some mild discomfort in the throat. This is from the tube placed in your throat while you were sleeping.  You may experience discomfort in the shoulder area from some trapped air between the liver and diaphragm. This sensation is normal and will slowly go away on its own.  The recovery time is shortened as long as there are no complications.  You will rest in a recovery room until stable and doing well. As long as there are no complications, you may be allowed to go home. Someone will need to drive you home and be with you for at least 24 hours once home. FINDING OUT THE RESULTS You will be called with the results of the pathology and will discuss these results with  your caregiver during your postoperative appointment. Do not assume everything is normal if you have not heard from your caregiver or the medical facility. It is important for you to follow up on all of your results. HOME CARE INSTRUCTIONS   Take all medicines as  directed.  Only take over-the-counter or prescription medicines for pain, discomfort, or fever as directed by your caregiver.  Resume daily activities as directed.  Showers are preferred over baths.  You may resume sexual activities in 1 week or as directed.  Do not drive while taking narcotics. SEEK MEDICAL CARE IF:  There is increasing abdominal pain.  You feel lightheaded or faint.  You have the chills.  You have an oral temperature above 102 F (38.9 C).  There is pus-like (purulent) drainage from any of the wounds.  You are unable to pass gas or have a bowel movement.  You feel sick to your stomach (nauseous) or throw up (vomit). MAKE SURE YOU:   Understand these instructions.  Will watch your condition.  Will get help right away if you are not doing well or get worse.  ExitCare Patient Information 2013 [place], [place].

## 2015-03-16 NOTE — Transfer of Care (Signed)
Immediate Anesthesia Transfer of Care Note  Patient: [name] [name]  Procedure(s) Performed: Procedure(s) with comments: LAPAROSCOPIC TUBAL LIGATION WITH FILSHIE CLIPS (Bilateral) - With Filshie clips  Requested 03/16/15 @ 1:00p  Patient Location: PACU  Anesthesia Type:General  Level of Consciousness: awake, alert  and oriented  Airway & Oxygen Therapy: Patient Spontanous Breathing and Patient connected to nasal cannula oxygen  Post-op Assessment: Report given to RN and Post -op Vital signs reviewed and stable  Post vital signs: Reviewed and stable  Last Vitals:  Filed Vitals:   03/16/15 1150  BP: 138/74  Pulse: 90  Resp: 20    Complications: No apparent anesthesia complications

## 2015-03-18 ENCOUNTER — Encounter (HOSPITAL_COMMUNITY): Payer: Self-pay | Admitting: Obstetrics & Gynecology

## 2015-04-13 ENCOUNTER — Encounter: Payer: Self-pay | Admitting: *Deleted

## 2015-06-12 ENCOUNTER — Emergency Department (HOSPITAL_COMMUNITY)
Admission: EM | Admit: 2015-06-12 | Discharge: 2015-06-12 | Disposition: A | Discharge status: Home / Self Care | Payer: Medicaid Other | Attending: Emergency Medicine | Admitting: Emergency Medicine

## 2015-06-12 ENCOUNTER — Encounter (HOSPITAL_COMMUNITY): Payer: Self-pay | Admitting: Family Medicine

## 2015-06-12 DIAGNOSIS — Z872 Personal history of diseases of the skin and subcutaneous tissue: Secondary | ICD-10-CM | POA: Insufficient documentation

## 2015-06-12 DIAGNOSIS — H1132 Conjunctival hemorrhage, left eye: Secondary | ICD-10-CM | POA: Insufficient documentation

## 2015-06-12 DIAGNOSIS — Z8619 Personal history of other infectious and parasitic diseases: Secondary | ICD-10-CM | POA: Insufficient documentation

## 2015-06-12 DIAGNOSIS — Z862 Personal history of diseases of the blood and blood-forming organs and certain disorders involving the immune mechanism: Secondary | ICD-10-CM | POA: Insufficient documentation

## 2015-06-12 DIAGNOSIS — H44812 Hemophthalmos, left eye: Secondary | ICD-10-CM

## 2015-06-12 NOTE — ED Provider Notes (Signed)
CSN: [id number]     Arrival date & time 06/12/15  0941 History  By signing my name below, I, Essence Howell, attest that this documentation has been prepared under the direction and in the presence of Catha Gosselin, PA-C Electronically Signed: Charline Bills, ED Scribe 06/12/2015 at 10:22 AM.   Chief Complaint  Patient presents with  . Eye Problem   The history is provided by the patient. No language interpreter was used.   HPI Comments: [name] [name] is a 24 y.o. female who presents to the Emergency Department complaining of persistent left eye redness for the past 6 days, worsened over the past 2 days. Pt states that she initially noted redness in her left eye upon waking 6 days ago. No treatments tried PTA. She denies eye pain and eye discharge. Pt does not wear corrective lenses. No sick contacts with similar symptoms. Pt recently started working at [employer] center and lifts heavy boxes. She denies any history of foreign body sensation, hypertension, diabetes, or straining with bowel movements, cough, or vomiting.  Past Medical History  Diagnosis Date  . Eczema   . Infection     chlamydia & gonorrhea  . Trichomonas   . Chlamydia     06/15/14 w/tx, 10/11/14 w/tx, TOC-neg x2  . Gonorrhea 06/15/14    w/tx, TOC- neg  . History of anemia    Past Surgical History  Procedure Laterality Date  . Wisdom tooth extraction    . Fractured finger as a child    . Laparoscopic tubal ligation Bilateral 03/16/2015    Procedure: LAPAROSCOPIC TUBAL LIGATION WITH FILSHIE CLIPS;  Surgeon: Tereso Newcomer, MD;  Location: [facility];  Service: Gynecology;  Laterality: Bilateral;  With Filshie clips  Requested 03/16/15 @ 1:00p   Family History  Problem Relation Age of Onset  . Other Neg Hx   . Asthma Sister   . Heart disease Maternal Grandmother    Social History  Substance Use Topics  . Smoking status: Never Smoker   . Smokeless tobacco: Never Used  . Alcohol Use: No   OB History     Gravida Para Term Preterm AB TAB SAB Ectopic Multiple Living   0 4     Review of Systems  Constitutional: Negative for fever.  Eyes: Positive for redness. Negative for pain and discharge.   Allergies  Review of patient's allergies indicates no known allergies.  Home Medications   Prior to Admission medications   Medication Sig Start Date End Date Taking? Authorizing Provider  docusate sodium (COLACE) 100 MG capsule Take 1 capsule (100 mg total) by mouth 2 (two) times daily as needed. 03/16/15   Tereso Newcomer, MD  ibuprofen (ADVIL,MOTRIN) 600 MG tablet Take 1 tablet (600 mg total) by mouth every 6 (six) hours as needed. 03/16/15   Tereso Newcomer, MD  oxyCODONE-acetaminophen (PERCOCET/ROXICET) 5-325 MG per tablet Take 1-2 tablets by mouth every 6 (six) hours as needed. 03/16/15   Tereso Newcomer, MD  sertraline (ZOLOFT) 50 MG tablet Take 1 tablet (50 mg total) by mouth daily. Patient not taking: Reported on 03/02/2015 03/01/15   Tereso Newcomer, MD   BP 123/69 mmHg  Pulse 86  Temp(Src) 98.3 F (36.8 C)  Resp 18  SpO2 98%  LMP 05/27/2015 Physical Exam  Constitutional: She is oriented to person, place, and time. She appears well-developed and well-nourished. No distress.  HENT:  Head: Normocephalic and atraumatic.  Eyes: EOM  are normal. Left conjunctiva has a hemorrhage (at 10 o'clock position).  Focal, flat, red region on the ocular surface. No crusting on the eyelashes. No pain with movement of the L eye.   Neck: Neck supple. No tracheal deviation present.  Cardiovascular: Normal rate.   Pulmonary/Chest: Effort normal. No respiratory distress.  Musculoskeletal: Normal range of motion.  Neurological: She is alert and oriented to person, place, and time.  Skin: Skin is warm and dry.  Psychiatric: She has a normal mood and affect. Her behavior is normal.  Nursing note and vitals reviewed.  ED Course  Procedures (including critical care time) DIAGNOSTIC  STUDIES: Oxygen Saturation is 98% on RA, normal by my interpretation.    COORDINATION OF CARE: 10:18 AM-Discussed treatment plan with pt at bedside and pt agreed to plan.   Labs Review Labs Reviewed - No data to display  Imaging Review No results found.   EKG Interpretation None        Visual Acuity  Right Eye Distance: 20/30 Left Eye Distance: 20/50 Bilateral Distance: 20/30  Right Eye Near:   Left Eye Near:    Bilateral Near:      MDM   Final diagnoses:  Eye hemorrhage, left  Patient presents for left eye redness upon awakening. She has no pain or visual changes. Her exam is consistent with subconjunctival hemorrhage. I discussed follow-up as well as return precautions and she verbally agrees with the plan.  I personally performed the services described in this documentation, which was scribed in my presence. The recorded information has been reviewed and is accurate.    Catha GosselinHanna Patel-Mills, PA-C 06/12/15 1241  Blane OharaJoshua Zavitz, MD 06/13/15 [phone]

## 2015-06-12 NOTE — ED Notes (Addendum)
Pt here for redness to left eye x a few days. Denies any pain or vision problems.

## 2015-10-02 ENCOUNTER — Encounter (HOSPITAL_COMMUNITY): Payer: Self-pay

## 2015-10-02 ENCOUNTER — Inpatient Hospital Stay (HOSPITAL_COMMUNITY)
Admission: AD | Admit: 2015-10-02 | Discharge: 2015-10-03 | Disposition: A | Discharge status: Home / Self Care | Payer: Medicaid Other | Source: Ambulatory Visit | Attending: Obstetrics and Gynecology | Admitting: Obstetrics and Gynecology

## 2015-10-02 DIAGNOSIS — B9689 Other specified bacterial agents as the cause of diseases classified elsewhere: Secondary | ICD-10-CM

## 2015-10-02 DIAGNOSIS — B3731 Acute candidiasis of vulva and vagina: Secondary | ICD-10-CM

## 2015-10-02 DIAGNOSIS — B373 Candidiasis of vulva and vagina: Secondary | ICD-10-CM | POA: Insufficient documentation

## 2015-10-02 DIAGNOSIS — N760 Acute vaginitis: Secondary | ICD-10-CM | POA: Insufficient documentation

## 2015-10-02 LAB — POCT PREGNANCY, URINE: Preg Test, Ur: NEGATIVE

## 2015-10-02 NOTE — MAU Note (Signed)
Pt c/o vaginal swelling and irritation that she noticed yesterday and has gotten worse throughout the day. Denies seeing any sores. Denies vag bleeding or discharge. Recently changed soap about 1 week ago. LMP: 09/10/2015

## 2015-10-03 DIAGNOSIS — N760 Acute vaginitis: Secondary | ICD-10-CM

## 2015-10-03 DIAGNOSIS — B373 Candidiasis of vulva and vagina: Secondary | ICD-10-CM

## 2015-10-03 DIAGNOSIS — A499 Bacterial infection, unspecified: Secondary | ICD-10-CM

## 2015-10-03 LAB — URINALYSIS, ROUTINE W REFLEX MICROSCOPIC
Bilirubin Urine: NEGATIVE
Glucose, UA: NEGATIVE mg/dL
Ketones, ur: NEGATIVE mg/dL
NITRITE: NEGATIVE
PROTEIN: 30 mg/dL — AB
Specific Gravity, Urine: >1.03 — ABNORMAL HIGH (ref 1.005–1.030)
pH: 6 (ref 5.0–8.0)

## 2015-10-03 LAB — WET PREP, GENITAL
Sperm: NONE SEEN
TRICH WET PREP: NONE SEEN

## 2015-10-03 LAB — URINE MICROSCOPIC-ADD ON

## 2015-10-03 MED ORDER — FLUCONAZOLE 150 MG PO TABS
ORAL_TABLET | ORAL | Status: DC
Start: 2015-10-03 — End: 2019-03-21

## 2015-10-03 MED ORDER — TRIAMCINOLONE ACETONIDE 0.1 % EX CREA: 1.0000 "application " | TOPICAL_CREAM | Freq: Two times a day (BID) | CUTANEOUS | Status: DC

## 2015-10-03 MED ORDER — NYSTATIN 100000 UNIT/GM EX CREA: TOPICAL_CREAM | CUTANEOUS | Status: DC

## 2015-10-03 MED ORDER — METRONIDAZOLE 500 MG PO TABS: 500.0000 mg | ORAL_TABLET | Freq: Two times a day (BID) | ORAL | Status: DC

## 2015-10-03 NOTE — Discharge Instructions (Signed)
Bacterial Vaginosis Bacterial vaginosis is an infection of the vagina. It happens when too many germs (bacteria) grow in the vagina. Having this infection puts you at risk for getting other infections from sex. Treating this infection can help lower your risk for other infections, such as:   Chlamydia.  Gonorrhea.  HIV.  Herpes. HOME CARE  Take your medicine as told by your doctor.  Finish your medicine even if you start to feel better.  Tell your sex partner that you have an infection. They should see their doctor for treatment.  During treatment:  Avoid sex or use condoms correctly.  Do not douche.  Do not drink alcohol unless your doctor tells you it is ok.  Do not breastfeed unless your doctor tells you it is ok. GET HELP IF:  You are not getting better after 3 days of treatment.  You have more grey fluid (discharge) coming from your vagina than before.  You have more pain than before.  You have a fever. MAKE SURE YOU:   Understand these instructions.  Will watch your condition.  Will get help right away if you are not doing well or get worse.   This information is not intended to replace advice given to you by your health care provider. Make sure you discuss any questions you have with your health care provider.   Document Released: 05/21/2008 Document Revised: 09/02/2014 Document Reviewed: 03/24/2013 Elsevier Interactive Patient Education 2016 [employer] Monilial Vaginitis Vaginitis in a soreness, swelling and redness (inflammation) of the vagina and vulva. Monilial vaginitis is not a sexually transmitted infection. CAUSES  Yeast vaginitis is caused by yeast (candida) that is normally found in your vagina. With a yeast infection, the candida has overgrown in number to a point that upsets the chemical balance. SYMPTOMS   White, thick vaginal discharge.  Swelling, itching, redness and irritation of the vagina and possibly the lips of the vagina  (vulva).  Burning or painful urination.  Painful intercourse. DIAGNOSIS  Things that may contribute to monilial vaginitis are:  Postmenopausal and virginal states.  Pregnancy.  Infections.  Being tired, sick or stressed, especially if you had monilial vaginitis in the past.  Diabetes. Good control will help lower the chance.  Birth control pills.  Tight fitting garments.  Using bubble bath, feminine sprays, douches or deodorant tampons.  Taking certain medications that kill germs (antibiotics).  Sporadic recurrence can occur if you become ill. TREATMENT  Your caregiver will give you medication.  There are several kinds of anti monilial vaginal creams and suppositories specific for monilial vaginitis. For recurrent yeast infections, use a suppository or cream in the vagina 2 times a week, or as directed.  Anti-monilial or steroid cream for the itching or irritation of the vulva may also be used. Get your caregiver's permission.  Painting the vagina with methylene blue solution may help if the monilial cream does not work.  Eating yogurt may help prevent monilial vaginitis. HOME CARE INSTRUCTIONS   Finish all medication as prescribed.  Do not have sex until treatment is completed or after your caregiver tells you it is okay.  Take warm sitz baths.  Do not douche.  Do not use tampons, especially scented ones.  Wear cotton underwear.  Avoid tight pants and panty hose.  Tell your sexual partner that you have a yeast infection. They should go to their caregiver if they have symptoms such as mild rash or itching.  Your sexual partner should be treated as well if   your infection is difficult to eliminate.  Practice safer sex. Use condoms.  Some vaginal medications cause latex condoms to fail. Vaginal medications that harm condoms are:  Cleocin cream.  Butoconazole (Femstat).  Terconazole (Terazol) vaginal suppository.  Miconazole (Monistat) (may be  purchased over the counter). SEEK MEDICAL CARE IF:   You have a temperature by mouth above 102 F (38.9 C).  The infection is getting worse after 2 days of treatment.  The infection is not getting better after 3 days of treatment.  You develop blisters in or around your vagina.  You develop vaginal bleeding, and it is not your menstrual period.  You have pain when you urinate.  You develop intestinal problems.  You have pain with sexual intercourse.   This information is not intended to replace advice given to you by your health care provider. Make sure you discuss any questions you have with your health care provider.   Document Released: 05/22/2005 Document Revised: 11/04/2011 Document Reviewed: 02/13/2015 Elsevier Interactive Patient Education 2016 Elsevier Inc[employer]  

## 2015-10-03 NOTE — MAU Provider Note (Signed)
History     CSN: [id number]  Arrival date and time: 10/02/15 2336   First Provider Initiated Contact with Patient 10/03/15 0000      Chief Complaint  Patient presents with  . Groin Swelling    vaginal irritation   HPI  Ms. Marinna ZAKYRA KUKUK is a 25 y.o. [id number] who presents to MAU today with complaint of vaginal irritation and swelling. The patient states that she noted irritation with itching x 2 days. Today symptoms became worse and she noted more swelling. She denies vaginal discharge, bleeding sores, abdominal pain or fever. She also denies UTI symptoms, history of HSV or new sexual partners. She states that she has just recently changed soap from plain ivory to shea butter.    OB History    Gravida Para Term Preterm AB TAB SAB Ectopic Multiple Living   0 4      Past Medical History  Diagnosis Date  . Eczema   . Infection     chlamydia & gonorrhea  . Trichomonas   . Chlamydia     06/15/14 w/tx, 10/11/14 w/tx, TOC-neg x2  . Gonorrhea 06/15/14    w/tx, TOC- neg  . History of anemia     Past Surgical History  Procedure Laterality Date  . Wisdom tooth extraction    . Fractured finger as a child    . Laparoscopic tubal ligation Bilateral 03/16/2015    Procedure: LAPAROSCOPIC TUBAL LIGATION WITH FILSHIE CLIPS;  Surgeon: Tereso Newcomer, MD;  Location: [facility];  Service: Gynecology;  Laterality: Bilateral;  With Filshie clips  Requested 03/16/15 @ 1:00p    Family History  Problem Relation Age of Onset  . Other Neg Hx   . Asthma Sister   . Heart disease Maternal Grandmother     Social History  Substance Use Topics  . Smoking status: Never Smoker   . Smokeless tobacco: Never Used  . Alcohol Use: No    Allergies: No Known Allergies  Prescriptions prior to admission  Medication Sig Dispense Refill Last Dose  . docusate sodium (COLACE) 100 MG capsule Take 1 capsule (100 mg total) by mouth 2 (two) times daily as needed. 30 capsule 2   . ibuprofen  (ADVIL,MOTRIN) 600 MG tablet Take 1 tablet (600 mg total) by mouth every 6 (six) hours as needed. 60 tablet 3   . oxyCODONE-acetaminophen (PERCOCET/ROXICET) 5-325 MG per tablet Take 1-2 tablets by mouth every 6 (six) hours as needed. 30 tablet 0   . sertraline (ZOLOFT) 50 MG tablet Take 1 tablet (50 mg total) by mouth daily. (Patient not taking: Reported on 03/02/2015) 30 tablet 5 Not Taking    Review of Systems  Constitutional: Negative for fever and malaise/fatigue.  Gastrointestinal: Negative for nausea, vomiting, abdominal pain, diarrhea and constipation.  Genitourinary: Negative for dysuria, urgency and frequency.       Neg -vaginal bleeding, discharge + irritation and swelling   Physical Exam   Blood pressure 132/70, pulse 97, temperature 97.6 F (36.4 C), temperature source Oral, resp. rate 18, height  (1.6 m), weight 200 lb 9.6 oz (90.992 kg), last menstrual period 09/10/2015, SpO2 100 %, not currently breastfeeding.  Physical Exam  Nursing note and vitals reviewed. Constitutional: She is oriented to person, place, and time. She appears well-developed and well-nourished. No distress.  HENT:  Head: Normocephalic and atraumatic.  Cardiovascular: Normal rate.   Respiratory: Effort normal.  GI: Soft. She exhibits no distension and no  mass. There is no tenderness. There is no rebound and no guarding.  Neurological: She is alert and oriented to person, place, and time.  Skin: Skin is warm and dry. No erythema.  Psychiatric: She has a normal mood and affect.    Results for orders placed or performed during the hospital encounter of 10/02/15 (from the past 24 hour(s))  Urinalysis, Routine w reflex microscopic (not at [facility])     Status: Abnormal   Collection Time: 10/02/15 11:49 PM  Result Value Ref Range   Color, Urine YELLOW YELLOW   APPearance CLEAR CLEAR   Specific Gravity, Urine >1.030 (H) 1.005 - 1.030   pH 6.0 5.0 - 8.0   Glucose, UA NEGATIVE NEGATIVE mg/dL   Hgb urine  dipstick SMALL (A) NEGATIVE   Bilirubin Urine NEGATIVE NEGATIVE   Ketones, ur NEGATIVE NEGATIVE mg/dL   Protein, ur 30 (A) NEGATIVE mg/dL   Nitrite NEGATIVE NEGATIVE   Leukocytes, UA MODERATE (A) NEGATIVE  Urine microscopic-add on     Status: Abnormal   Collection Time: 10/02/15 11:49 PM  Result Value Ref Range   Squamous Epithelial / LPF 6-30 (A) NONE SEEN   WBC, UA 6-30 0 - 5 WBC/hpf   RBC / HPF 0-5 0 - 5 RBC/hpf   Bacteria, UA MANY (A) NONE SEEN   Urine-Other YEAST PRESENT   Pregnancy, urine POC     Status: None   Collection Time: 10/02/15 11:57 PM  Result Value Ref Range   Preg Test, Ur NEGATIVE NEGATIVE  Wet prep, genital     Status: Abnormal   Collection Time: 10/02/15 11:59 PM  Result Value Ref Range   Yeast Wet Prep HPF POC PRESENT (A) NONE SEEN   Trich, Wet Prep NONE SEEN NONE SEEN   Clue Cells Wet Prep HPF POC PRESENT (A) NONE SEEN   WBC, Wet Prep HPF POC MANY (A) NONE SEEN   Sperm NONE SEEN     MAU Course  Procedures None  MDM UPT - negative UA, wet prep today  Assessment and Plan  A: Vaginal swelling  Bacterial Vaginosis Yeast vulvovaginitis  P: Discharge home Rx for triamcinolone cream, nystatin cream, Diflucan and Flagyl given  Warning signs for worsening condition discussed Advised change in soap  Patient advised to follow-up with [facility] if symptoms persist or worsen Patient may return to MAU as needed or if her condition were to change or worsen   Marny Lowenstein, PA-C  10/03/2015, 12:47 AM

## 2017-01-09 ENCOUNTER — Encounter (HOSPITAL_COMMUNITY): Payer: Self-pay | Admitting: Emergency Medicine

## 2017-01-09 ENCOUNTER — Emergency Department (HOSPITAL_COMMUNITY): Payer: Self-pay

## 2017-01-09 ENCOUNTER — Emergency Department (HOSPITAL_COMMUNITY)
Admission: EM | Admit: 2017-01-09 | Discharge: 2017-01-09 | Disposition: A | Discharge status: Home / Self Care | Payer: Self-pay | Attending: Emergency Medicine | Admitting: Emergency Medicine

## 2017-01-09 DIAGNOSIS — G8929 Other chronic pain: Secondary | ICD-10-CM | POA: Insufficient documentation

## 2017-01-09 DIAGNOSIS — N390 Urinary tract infection, site not specified: Secondary | ICD-10-CM | POA: Insufficient documentation

## 2017-01-09 DIAGNOSIS — M545 Low back pain, unspecified: Secondary | ICD-10-CM

## 2017-01-09 LAB — URINALYSIS, ROUTINE W REFLEX MICROSCOPIC
Bilirubin Urine: NEGATIVE
Glucose, UA: NEGATIVE mg/dL
Hgb urine dipstick: NEGATIVE
Ketones, ur: NEGATIVE mg/dL
Nitrite: NEGATIVE
Protein, ur: NEGATIVE mg/dL
Specific Gravity, Urine: 1.015 (ref 1.005–1.030)
pH: 6 (ref 5.0–8.0)

## 2017-01-09 LAB — PREGNANCY, URINE: Preg Test, Ur: NEGATIVE

## 2017-01-09 MED ORDER — METHOCARBAMOL 500 MG PO TABS: 500.0000 mg | ORAL_TABLET | Freq: Two times a day (BID) | ORAL | 0 refills | Status: DC

## 2017-01-09 MED ORDER — CEPHALEXIN 500 MG PO CAPS: 500.0000 mg | ORAL_CAPSULE | Freq: Two times a day (BID) | ORAL | 0 refills | Status: DC

## 2017-01-09 MED ORDER — IBUPROFEN 600 MG PO TABS: 600.0000 mg | ORAL_TABLET | Freq: Four times a day (QID) | ORAL | 0 refills | Status: DC | PRN

## 2017-01-09 NOTE — ED Notes (Signed)
See EDP secondary assessment.  

## 2017-01-09 NOTE — ED Triage Notes (Signed)
Pt sts mid to lower right sided back pain x months

## 2017-01-09 NOTE — Discharge Instructions (Signed)
Medications: ibuprofen, Robaxin  Treatment: Take ibuprofen every 6 hours as needed for your pain. You can alternate with Tylenol as prescribed over-the-counter. Take Robaxin twice daily as needed for muscle pain and spasms. Do not drive or operate machinery when taking this medication. Use a heating pad to your back 3-4 times daily alternating 20 minutes on, 20 minutes off. Attempt the exercises and stretches as tolerated 1-2 times daily.  Follow-up: Please follow-up with the orthopedic doctor outlined below for further evaluation and treatment of your back pain. Please return to the emergency department if you develop any new or worsening symptoms including complete numbness of your legs or groin, loss of bowel or bladder control, or any other new or concerning symptoms.

## 2017-01-09 NOTE — ED Provider Notes (Signed)
[facility]-EMERGENCY DEPT Provider Note   CSN: [id number] Arrival date & time: 01/09/17  0909  By signing my name below, I, Sonum Patel, attest that this documentation has been prepared under the direction and in the presence of Hewlett-Packardlexandra Tamryn Popko PA-C. Electronically Signed: Sonum Patel, Neurosurgeoncribe. 01/09/17. 1:27 PM.  History   Chief Complaint Chief Complaint  Patient presents with  . Back Pain   The history is provided by the patient. No language interpreter was used.     HPI Comments: [name] [name] is a 26 y.o. female with past medical history of scoliosis who presents to the Emergency Department complaining of constant, gradually worsened lower back pain for the last several years.She denies any new injury. She also notes 2 weeks of urinary frequency. She has tried Tylenol which is no longer providing relief. She denies numbness, weakness, fever, unexpected weight loss, tingling, bowel/bladder incontinence, saddle anesthesia, dysuria. She denies hx of CA, recent procedure to back, or IV drug use.  Past Medical History:  Diagnosis Date  . Chlamydia    06/15/14 w/tx, 10/11/14 w/tx, TOC-neg x2  . Eczema   . Gonorrhea 06/15/14   w/tx, TOC- neg  . History of anemia   . Infection    chlamydia & gonorrhea  . Trichomonas     Patient Active Problem List   Diagnosis Date Noted  . Encounter for female sterilization procedure     Past Surgical History:  Procedure Laterality Date  . fractured finger as a child    . LAPAROSCOPIC TUBAL LIGATION Bilateral 03/16/2015   Procedure: LAPAROSCOPIC TUBAL LIGATION WITH FILSHIE CLIPS;  Surgeon: Tereso NewcomerUgonna A Anyanwu, MD;  Location: [facility];  Service: Gynecology;  Laterality: Bilateral;  With Filshie clips  Requested 03/16/15 @ 1:00p  . WISDOM TOOTH EXTRACTION      OB History    Gravida Para Term Preterm AB Living   5 4 4   1 4    SAB TAB Ectopic Multiple Live Births   1     0 4       Home Medications    Prior to Admission medications   Medication  Sig Start Date End Date Taking? Authorizing Provider  cephALEXin (KEFLEX) 500 MG capsule Take 1 capsule (500 mg total) by mouth 2 (two) times daily. 01/09/17   Sherrell Farish, Waylan BogaAlexandra M, PA-C  docusate sodium (COLACE) 100 MG capsule Take 1 capsule (100 mg total) by mouth 2 (two) times daily as needed. 03/16/15   Anyanwu, Jethro BastosUgonna A, MD  fluconazole (DIFLUCAN) 150 MG tablet Take 1 tab (150 mg) today and 1 tab (150 mg) in 7 days 10/03/15   Marny LowensteinWenzel, Julie N, PA-C  ibuprofen (ADVIL,MOTRIN) 600 MG tablet Take 1 tablet (600 mg total) by mouth every 6 (six) hours as needed. 01/09/17   Meer Reindl, Waylan BogaAlexandra M, PA-C  methocarbamol (ROBAXIN) 500 MG tablet Take 1 tablet (500 mg total) by mouth 2 (two) times daily. 01/09/17   Jonael Paradiso, Waylan BogaAlexandra M, PA-C  metroNIDAZOLE (FLAGYL) 500 MG tablet Take 1 tablet (500 mg total) by mouth 2 (two) times daily. 10/03/15   Marny LowensteinWenzel, Julie N, PA-C  nystatin cream (MYCOSTATIN) Apply to affected area 2 times daily 10/03/15   Marny LowensteinWenzel, Julie N, PA-C  oxyCODONE-acetaminophen (PERCOCET/ROXICET) 5-325 MG per tablet Take 1-2 tablets by mouth every 6 (six) hours as needed. 03/16/15   Anyanwu, Jethro BastosUgonna A, MD  sertraline (ZOLOFT) 50 MG tablet Take 1 tablet (50 mg total) by mouth daily. Patient not taking: Reported on 03/02/2015 03/01/15   Anyanwu, Jethro BastosUgonna A,  MD  triamcinolone cream (KENALOG) 0.1 % Apply 1 application topically 2 (two) times daily. 10/03/15   Marny Lowenstein, PA-C    Family History Family History  Problem Relation Age of Onset  . Other Neg Hx   . Asthma Sister   . Heart disease Maternal Grandmother     Social History Social History  Substance Use Topics  . Smoking status: Never Smoker  . Smokeless tobacco: Never Used  . Alcohol use No     Allergies   Patient has no known allergies.   Review of Systems Review of Systems  Constitutional: Negative for fever.  Genitourinary: Positive for frequency. Negative for dysuria.  Musculoskeletal: Positive for back pain.  Neurological: Negative for weakness  and numbness.     Physical Exam Updated Vital Signs BP 140/73 (BP Location: Left Arm)   Pulse 96   Temp 97.9 F (36.6 C) (Oral)   Resp 18   SpO2 99%   Physical Exam  Constitutional: She appears well-developed and well-nourished. No distress.  HENT:  Head: Normocephalic and atraumatic.  Mouth/Throat: Oropharynx is clear and moist. No oropharyngeal exudate.  Eyes: Conjunctivae are normal. Pupils are equal, round, and reactive to light. Right eye exhibits no discharge. Left eye exhibits no discharge. No scleral icterus.  Neck: Normal range of motion. Neck supple. No thyromegaly present.  Cardiovascular: Normal rate, regular rhythm, normal heart sounds and intact distal pulses.  Exam reveals no gallop and no friction rub.   No murmur heard. Pulmonary/Chest: Effort normal and breath sounds normal. No stridor. No respiratory distress. She has no wheezes. She has no rales.  Musculoskeletal: She exhibits no edema.  Midline lumbar and lower thoracic tenderness. Right sided paraspinal tenderness and spasm at the lower thoracic and lumbar area. No cervical midline tenderness.   Lymphadenopathy:    She has no cervical adenopathy.  Neurological: She is alert. No sensory deficit. Coordination normal.  5/5 strength to lower extremities. Normal sensation. 2+ patellar reflexes.   Skin: Skin is warm and dry. No rash noted. She is not diaphoretic. No pallor.  Psychiatric: She has a normal mood and affect.  Nursing note and vitals reviewed.    ED Treatments / Results  DIAGNOSTIC STUDIES: Oxygen Saturation is 99% on RA, normal by my interpretation.    COORDINATION OF CARE: 10:30 AM Discussed treatment plan with pt at bedside and pt agreed to plan.   Labs (all labs ordered are listed, but only abnormal results are displayed) Labs Reviewed  URINALYSIS, ROUTINE W REFLEX MICROSCOPIC - Abnormal; Notable for the following:       Result Value   APPearance CLOUDY (*)    Leukocytes, UA LARGE (*)      Bacteria, UA MANY (*)    Squamous Epithelial / LPF 6-30 (*)    All other components within normal limits  URINE CULTURE  PREGNANCY, URINE    EKG  EKG Interpretation None       Radiology Dg Thoracic Spine 2 View  Result Date: 01/09/2017 CLINICAL DATA:  Back pain.  Radiation right side.  No injury. EXAM: THORACIC SPINE 2 VIEWS COMPARISON:  No recent prior . FINDINGS: No acute bony abnormality identified. No evidence of fracture. Mild S-shaped scoliosis thoracic spine. Paraspinal soft tissues normal . IMPRESSION: Mild S-shaped scoliosis thoracic spine. No acute or focal abnormality. Electronically Signed   By: Maisie Fus  Register   On: 01/09/2017 12:32   Dg Lumbar Spine Complete  Result Date: 01/09/2017 CLINICAL DATA:  Pain with radiation down  right side.  No injury. EXAM: LUMBAR SPINE - COMPLETE 4+ VIEW COMPARISON:  No recent prior. FINDINGS: No acute bony abnormalities identified. Normal alignment mineralization. Bilateral tubal ligation clips noted. IMPRESSION: No acute or focal bony abnormalities identified.  Normal alignment. Electronically Signed   By: Maisie Fus  Register   On: 01/09/2017 12:31    Procedures Procedures (including critical care time)  Medications Ordered in ED Medications - No data to display   Initial Impression / Assessment and Plan / ED Course  I have reviewed the triage vital signs and the nursing notes.  Pertinent labs & imaging results that were available during my care of the patient were reviewed by me and considered in my medical decision making (see chart for details).     Patient with back pain.  No neurological deficits and normal neuro exam.  Patient is ambulatory.  No loss of bowel or bladder control.  No concern for cauda equina.  No fever, night sweats, weight loss, h/o cancer, IVDA, no recent procedure to back. Thoracic x-ray shows mild S-shaped scoliosis, but no acute or focal abnormality. No acute or focal abnormality found on lumbar spine  x-ray. UA shows large leukocytes, Many bacteria, 6-30 WBCs, 6-30 squamous cells. Urine culture sent, however will treat with Keflex considering patient's history of urinary frequency. Supportive care, including heat, stretches, ibuprofen, Robaxin, and return precaution discussed. Appears safe for discharge at this time. Follow up with ortho as indicated in discharge paperwork.    Final Clinical Impressions(s) / ED Diagnoses   Final diagnoses:  Lower urinary tract infectious disease  Chronic right-sided low back pain without sciatica    New Prescriptions New Prescriptions   CEPHALEXIN (KEFLEX) 500 MG CAPSULE    Take 1 capsule (500 mg total) by mouth 2 (two) times daily.   IBUPROFEN (ADVIL,MOTRIN) 600 MG TABLET    Take 1 tablet (600 mg total) by mouth every 6 (six) hours as needed.   METHOCARBAMOL (ROBAXIN) 500 MG TABLET    Take 1 tablet (500 mg total) by mouth 2 (two) times daily.   I personally performed the services described in this documentation, which was scribed in my presence. The recorded information has been reviewed and is accurate.    Emi Holes, PA-C 01/09/17 1328    Benjiman Core, MD 01/09/17 [phone]

## 2017-01-10 LAB — URINE CULTURE

## 2017-02-10 ENCOUNTER — Emergency Department (HOSPITAL_COMMUNITY)
Admission: EM | Admit: 2017-02-10 | Discharge: 2017-02-10 | Discharge status: Against Medical Advice | Payer: Medicaid Other

## 2017-02-10 NOTE — ED Notes (Signed)
Name called multiple times, no answer

## 2017-02-10 NOTE — ED Notes (Signed)
Called x 2 no answer

## 2017-03-12 IMAGING — US US FETAL BPP W/O NONSTRESS
1 series · 14 of 28 positions shown · non-contrast
Comparison: none

[Series 1: us fetal bpp w/o nonstress · non-contrast · 33 acquisitions, 14 frames shown]
[im 2/33]
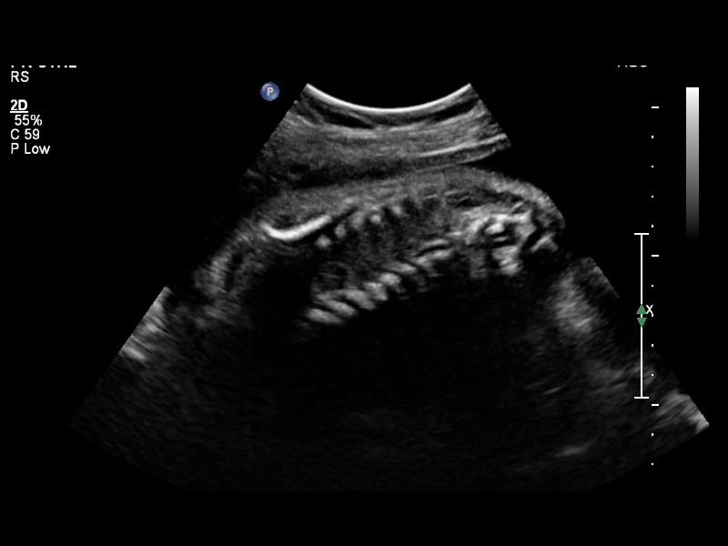
[im 4/33]
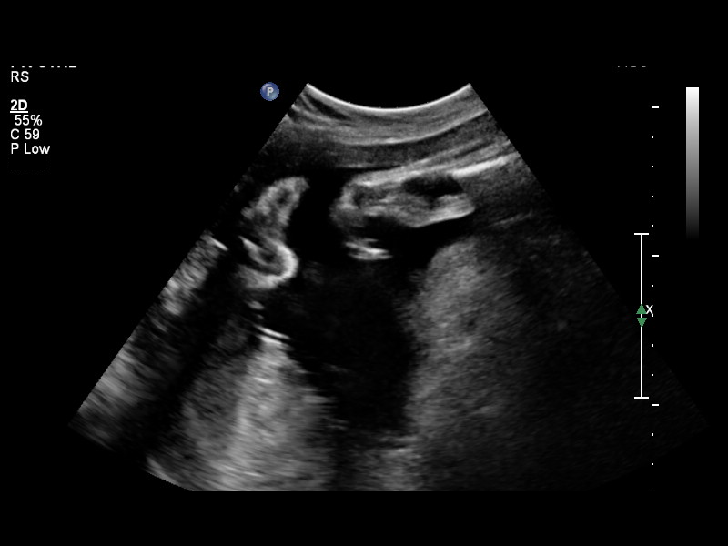
[im 6/33]
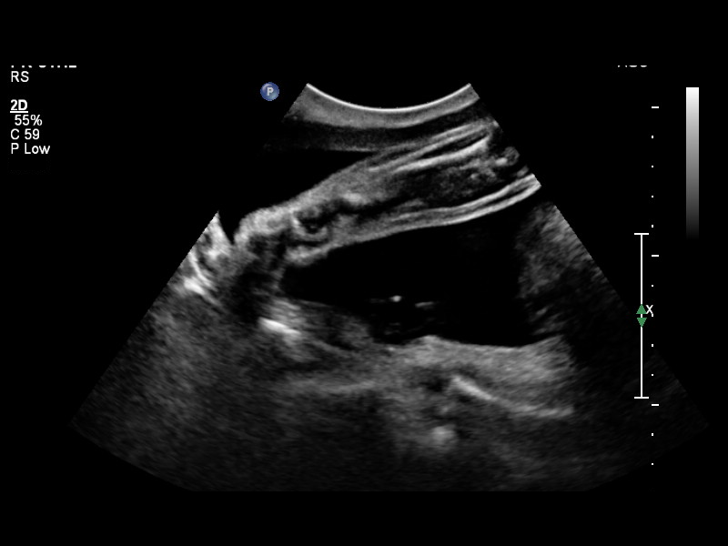
[im 9/33]
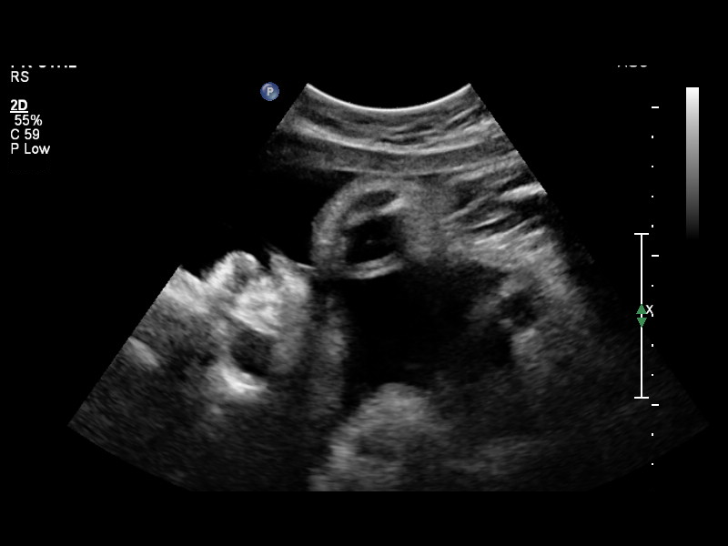
[im 11/33]
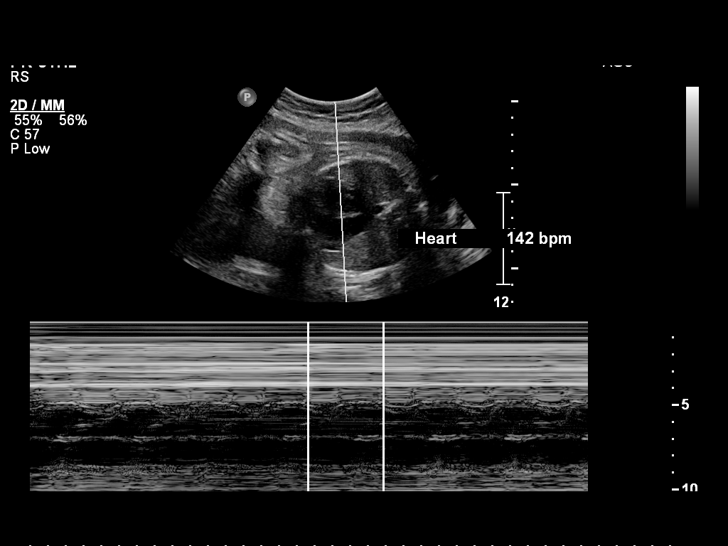
[im 14/33]
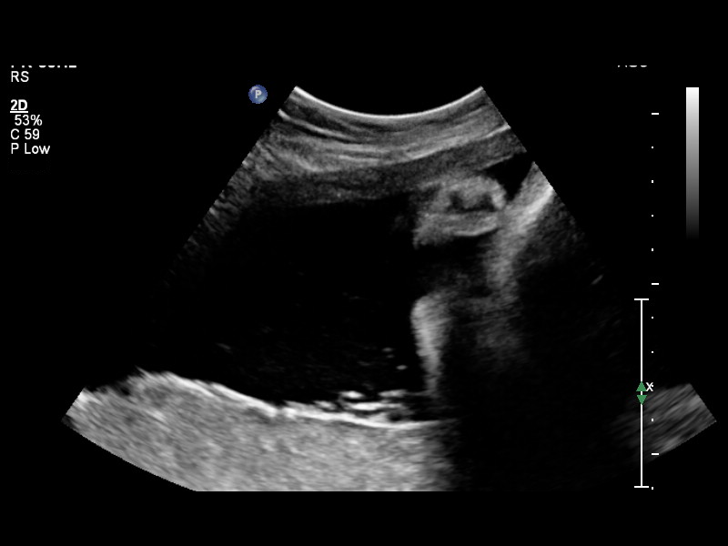
[im 16/33]
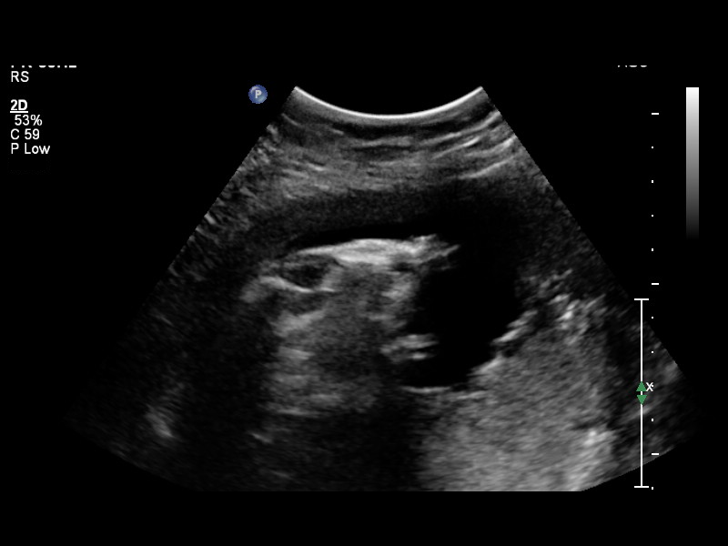
[im 18/33]
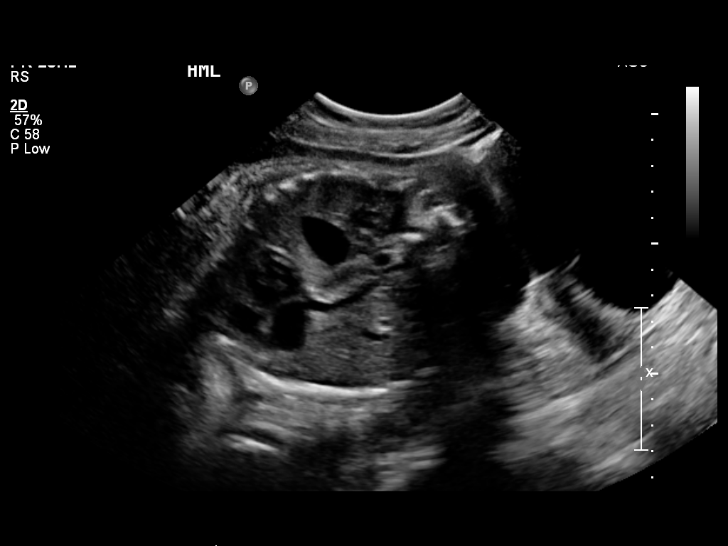
[im 21/33]
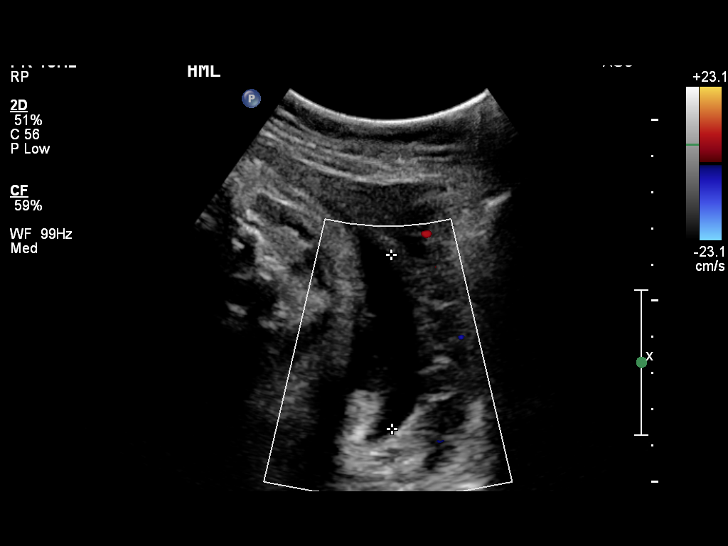
[im 23/33]
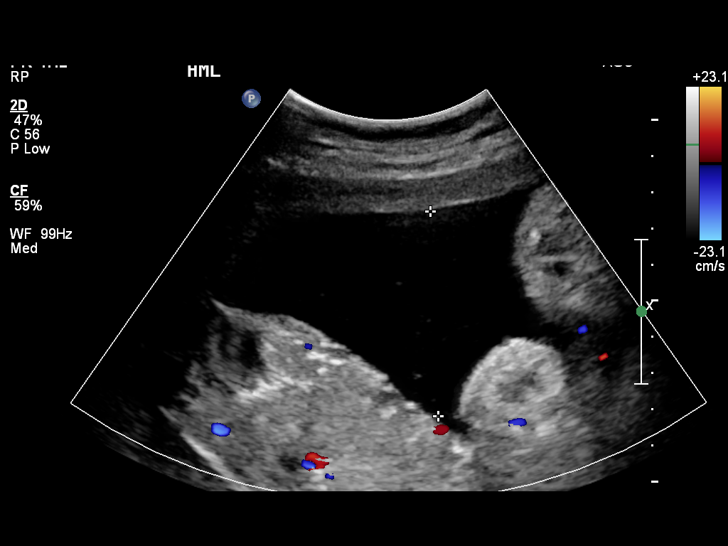
[im 25/33]
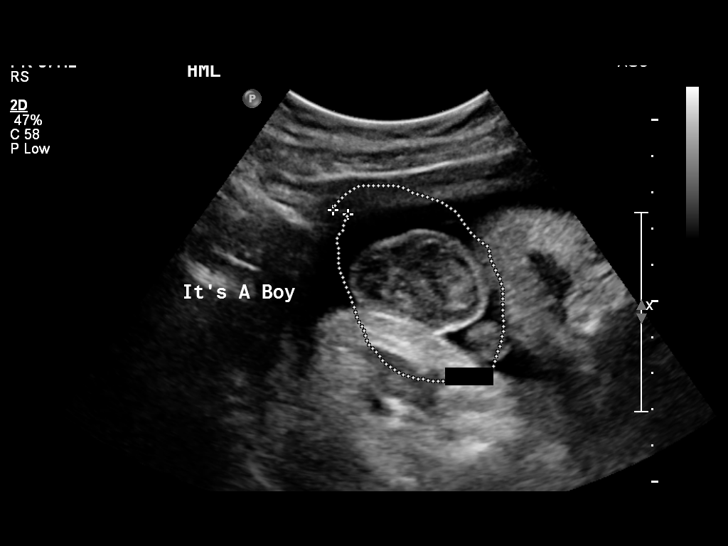
[im 28/33]
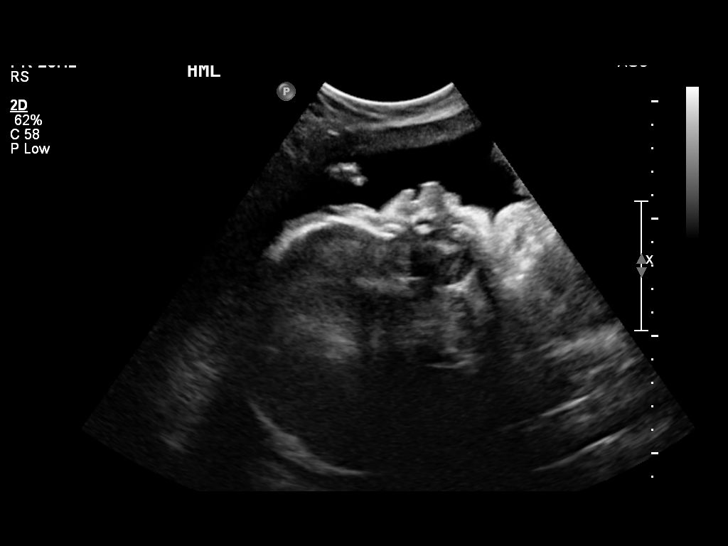
[im 30/33]
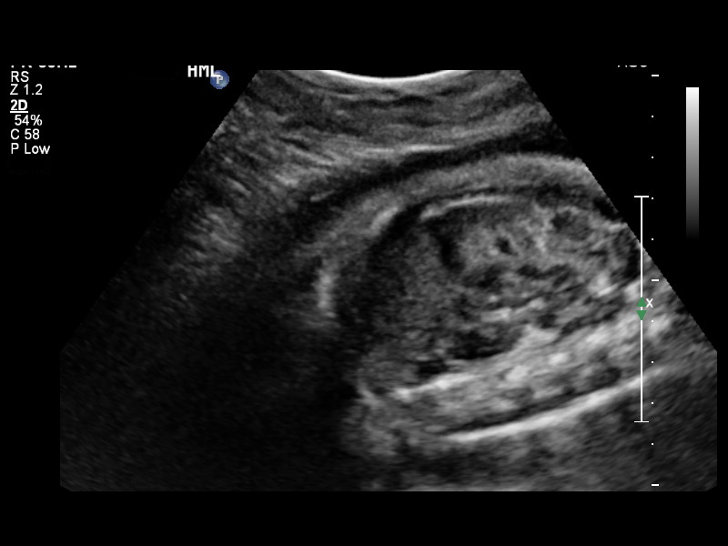
[im 33/33]
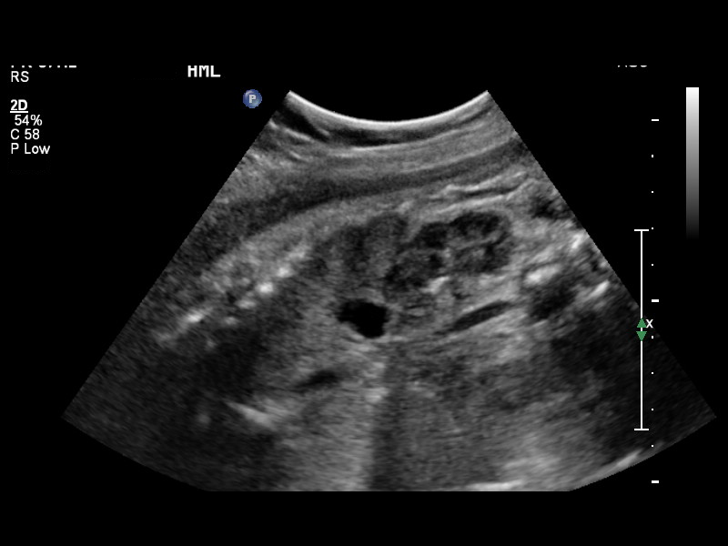

[14 of 28 positions shown; findings below may reference images not displayed]

OBSTETRICS REPORT
(Signed Final 12/14/2014 [DATE])

Service(s) Provided

Indications

34 weeks gestation of pregnancy
Non-reactive NST, FHR decelerations
Fetal Evaluation

Num Of Fetuses:    1
Fetal Heart Rate:  142                          bpm
Cardiac Activity:  Observed
Presentation:      Transverse, head to
maternal left

Amniotic Fluid
AFI FV:      Subjectively within normal limits
AFI Sum:     20.97   cm       79  %Tile     Larg Pckt:    5.68  cm
RUQ:   4.88    cm   RLQ:    5.63   cm    LUQ:   4.78    cm   LLQ:    5.68   cm
Biophysical Evaluation

Amniotic F.V:   Pocket => 2 cm two         F. Tone:        Observed
planes
F. Movement:    Observed                   Score:          [DATE]
F. Breathing:   Not Observed
Gestational Age

LMP:           34w 1d        Date:  04/18/14                 EDD:   01/23/15
Best:          34w 1d     Det. By:  LMP  (04/18/14)          EDD:   01/23/15
Impression

Single IUP at 34w 1d
BPP [DATE] (-2 for non-sustained fetal breathing)
Normal amniotic fluid volume
Recommendations

Recommend admission for prolonged monitoring
Consider repeating BPP within 24 hours or prior to hospital
discharge.

## 2018-06-24 ENCOUNTER — Encounter (HOSPITAL_COMMUNITY): Payer: Self-pay | Admitting: *Deleted

## 2018-06-24 ENCOUNTER — Emergency Department (HOSPITAL_COMMUNITY)
Admission: EM | Admit: 2018-06-24 | Discharge: 2018-06-24 | Disposition: A | Discharge status: Home / Self Care | Payer: Self-pay | Attending: Emergency Medicine | Admitting: Emergency Medicine

## 2018-06-24 DIAGNOSIS — J069 Acute upper respiratory infection, unspecified: Secondary | ICD-10-CM

## 2018-06-24 DIAGNOSIS — R05 Cough: Secondary | ICD-10-CM | POA: Insufficient documentation

## 2018-06-24 DIAGNOSIS — Z79899 Other long term (current) drug therapy: Secondary | ICD-10-CM | POA: Insufficient documentation

## 2018-06-24 DIAGNOSIS — B9789 Other viral agents as the cause of diseases classified elsewhere: Secondary | ICD-10-CM

## 2018-06-24 NOTE — Discharge Instructions (Addendum)
You appear to have an upper respiratory infection (URI). An upper respiratory tract infection, or cold, is a viral infection of the air passages leading to the lungs. It is contagious and can be spread to others, especially during the first 3 or 4 days. It cannot be cured by antibiotics or other medicines. °RETURN IMMEDIATELY IF you develop shortness of breath, confusion or altered mental status, a new rash, become dizzy, faint, or poorly responsive, or are unable to be cared for at home. ° °

## 2018-06-24 NOTE — ED Provider Notes (Signed)
[facility] EMERGENCY DEPARTMENT Provider Note   CSN: [id number] Arrival date & time: 06/24/18  1215     History   Chief Complaint Chief Complaint  Patient presents with  . URI    HPI  [name] [name] is a 27 y.o. female who complains of sore throat, swollen glands, dry cough, myalgias and headache for 1 days. She denies a history of anorexia, chest pain, fevers, nausea, shortness of breath, sweats, vomiting, weakness and wheezing and denies a history of asthma. Patient denies smoke cigarettes.     HPI  Past Medical History:  Diagnosis Date  . Chlamydia    06/15/14 w/tx, 10/11/14 w/tx, TOC-neg x2  . Eczema   . Gonorrhea 06/15/14   w/tx, TOC- neg  . History of anemia   . Infection    chlamydia & gonorrhea  . Trichomonas     Patient Active Problem List   Diagnosis Date Noted  . Encounter for female sterilization procedure     Past Surgical History:  Procedure Laterality Date  . fractured finger as a child    . LAPAROSCOPIC TUBAL LIGATION Bilateral 03/16/2015   Procedure: LAPAROSCOPIC TUBAL LIGATION WITH FILSHIE CLIPS;  Surgeon: Tereso Newcomer, MD;  Location: [facility];  Service: Gynecology;  Laterality: Bilateral;  With Filshie clips  Requested 03/16/15 @ 1:00p  . WISDOM TOOTH EXTRACTION       OB History    Gravida  5   Para  4   Term  4   Preterm      AB  1   Living  4     SAB  1   TAB      Ectopic      Multiple  0   Live Births  4            Home Medications    Prior to Admission medications   Medication Sig Start Date End Date Taking? Authorizing Provider  cephALEXin (KEFLEX) 500 MG capsule Take 1 capsule (500 mg total) by mouth 2 (two) times daily. 01/09/17   Law, Waylan Boga, PA-C  docusate sodium (COLACE) 100 MG capsule Take 1 capsule (100 mg total) by mouth 2 (two) times daily as needed. 03/16/15   Anyanwu, Jethro Bastos, MD  fluconazole (DIFLUCAN) 150 MG tablet Take 1 tab (150 mg) today and 1 tab (150 mg) in 7  days 10/03/15   Marny Lowenstein, PA-C  ibuprofen (ADVIL,MOTRIN) 600 MG tablet Take 1 tablet (600 mg total) by mouth every 6 (six) hours as needed. 01/09/17   Law, Waylan Boga, PA-C  methocarbamol (ROBAXIN) 500 MG tablet Take 1 tablet (500 mg total) by mouth 2 (two) times daily. 01/09/17   Law, Waylan Boga, PA-C  metroNIDAZOLE (FLAGYL) 500 MG tablet Take 1 tablet (500 mg total) by mouth 2 (two) times daily. 10/03/15   Marny Lowenstein, PA-C  nystatin cream (MYCOSTATIN) Apply to affected area 2 times daily 10/03/15   Marny Lowenstein, PA-C  oxyCODONE-acetaminophen (PERCOCET/ROXICET) 5-325 MG per tablet Take 1-2 tablets by mouth every 6 (six) hours as needed. 03/16/15   Anyanwu, Jethro Bastos, MD  sertraline (ZOLOFT) 50 MG tablet Take 1 tablet (50 mg total) by mouth daily. Patient not taking: Reported on 03/02/2015 03/01/15   Tereso Newcomer, MD  triamcinolone cream (KENALOG) 0.1 % Apply 1 application topically 2 (two) times daily. 10/03/15   Marny Lowenstein, PA-C    Family History Family History  Problem Relation Age of Onset  .  Other Neg Hx   . Asthma Sister   . Heart disease Maternal Grandmother     Social History Social History   Tobacco Use  . Smoking status: Never Smoker  . Smokeless tobacco: Never Used  Substance Use Topics  . Alcohol use: No  . Drug use: No     Allergies   Patient has no known allergies.   Review of Systems Review of Systems  Constitutional: Negative for chills and fever.  HENT: Positive for sore throat. Negative for trouble swallowing.   Respiratory: Positive for cough.   Cardiovascular: Negative for chest pain.     Physical Exam Updated Vital Signs BP 132/66 (BP Location: Right Arm)   Pulse 99   Temp 98.9 F (37.2 C) (Oral)   Resp 20   SpO2 100%   Physical Exam  Constitutional: She is oriented to person, place, and time. She appears well-developed and well-nourished. She appears ill. No distress.  HENT:  Head: Normocephalic and atraumatic.  Eyes: Pupils  are equal, round, and reactive to light. Conjunctivae are normal. No scleral icterus.  Neck: Normal range of motion.  Cardiovascular: Normal rate, regular rhythm and normal heart sounds. Exam reveals no gallop and no friction rub.  No murmur heard. Pulmonary/Chest: Effort normal and breath sounds normal. No respiratory distress.  Abdominal: Soft. Bowel sounds are normal. She exhibits no distension and no mass. There is no tenderness. There is no guarding.  Neurological: She is alert and oriented to person, place, and time.  Skin: Skin is warm and dry. She is not diaphoretic.  Psychiatric: Her behavior is normal.  Nursing note and vitals reviewed.    ED Treatments / Results  Labs (all labs ordered are listed, but only abnormal results are displayed) Labs Reviewed - No data to display  EKG None  Radiology No results found.  Procedures Procedures (including critical care time)  Medications Ordered in ED Medications - No data to display   Initial Impression / Assessment and Plan / ED Course  I have reviewed the triage vital signs and the nursing notes.  Pertinent labs & imaging results that were available during my care of the patient were reviewed by me and considered in my medical decision making (see chart for details).      Patients symptoms are consistent with URI, likely viral etiology. Discussed that antibiotics are not indicated for viral infections. Pt will be discharged with symptomatic treatment.  Verbalizes understanding and is agreeable with plan. Pt is hemodynamically stable & in NAD prior to dc.   Final Clinical Impressions(s) / ED Diagnoses   Final diagnoses:  Viral URI with cough    ED Discharge Orders    None       Arthor Captain, PA-C 06/24/18 1433    Linwood Dibbles, MD 06/26/18 [phone]

## 2018-06-24 NOTE — ED Triage Notes (Signed)
Pt in c/o body aches, sore throat and cough since yesterday, unsure of fever at home, no distress noted, took OTC cold and flu medication and it helped for a little bit but symptoms returned

## 2018-12-27 ENCOUNTER — Other Ambulatory Visit: Payer: Self-pay

## 2018-12-27 ENCOUNTER — Encounter (HOSPITAL_COMMUNITY): Payer: Self-pay | Admitting: Emergency Medicine

## 2018-12-27 ENCOUNTER — Emergency Department (HOSPITAL_COMMUNITY)
Admission: EM | Admit: 2018-12-27 | Discharge: 2018-12-28 | Disposition: A | Discharge status: Home / Self Care | Payer: Self-pay | Attending: Emergency Medicine | Admitting: Emergency Medicine

## 2018-12-27 DIAGNOSIS — R6883 Chills (without fever): Secondary | ICD-10-CM | POA: Insufficient documentation

## 2018-12-27 DIAGNOSIS — R1033 Periumbilical pain: Secondary | ICD-10-CM | POA: Insufficient documentation

## 2018-12-27 DIAGNOSIS — R112 Nausea with vomiting, unspecified: Secondary | ICD-10-CM | POA: Insufficient documentation

## 2018-12-27 DIAGNOSIS — M7918 Myalgia, other site: Secondary | ICD-10-CM | POA: Insufficient documentation

## 2018-12-27 LAB — COMPREHENSIVE METABOLIC PANEL
ALT: 26 U/L (ref 0–44)
AST: 25 U/L (ref 15–41)
Albumin: 4.4 g/dL (ref 3.5–5.0)
Alkaline Phosphatase: 61 U/L (ref 38–126)
Anion gap: 11 (ref 5–15)
BUN: 11 mg/dL (ref 6–20)
CO2: 26 mmol/L (ref 22–32)
Calcium: 10 mg/dL (ref 8.9–10.3)
Chloride: 103 mmol/L (ref 98–111)
Creatinine, Ser: 0.68 mg/dL (ref 0.44–1.00)
GFR calc Af Amer: >60 mL/min (ref >60)
GFR calc non Af Amer: >60 mL/min (ref >60)
Glucose, Bld: 98 mg/dL (ref 70–99)
Potassium: 3.3 mmol/L — ABNORMAL LOW (ref 3.5–5.1)
Sodium: 140 mmol/L (ref 135–145)
Total Bilirubin: 0.8 mg/dL (ref 0.3–1.2)
Total Protein: 8.4 g/dL — ABNORMAL HIGH (ref 6.5–8.1)

## 2018-12-27 LAB — URINALYSIS, ROUTINE W REFLEX MICROSCOPIC
Bilirubin Urine: NEGATIVE
Glucose, UA: NEGATIVE mg/dL
Ketones, ur: 80 mg/dL — AB
Nitrite: NEGATIVE
Protein, ur: 30 mg/dL — AB
Specific Gravity, Urine: 1.025 (ref 1.005–1.030)
pH: 6 (ref 5.0–8.0)

## 2018-12-27 LAB — CBC
HCT: 30.4 % — ABNORMAL LOW (ref 36.0–46.0)
Hemoglobin: 9.3 g/dL — ABNORMAL LOW (ref 12.0–15.0)
MCH: 23.6 pg — ABNORMAL LOW (ref 26.0–34.0)
MCHC: 30.6 g/dL (ref 30.0–36.0)
MCV: 77.2 fL — ABNORMAL LOW (ref 80.0–100.0)
Platelets: 316 10*3/uL (ref 150–400)
RBC: 3.94 MIL/uL (ref 3.87–5.11)
RDW: 16.5 % — ABNORMAL HIGH (ref 11.5–15.5)
WBC: 7.8 10*3/uL (ref 4.0–10.5)
nRBC: 0 % (ref 0.0–0.2)

## 2018-12-27 LAB — LIPASE, BLOOD: Lipase: 48 U/L (ref 11–51)

## 2018-12-27 LAB — I-STAT BETA HCG BLOOD, ED (MC, WL, AP ONLY): I-stat hCG, quantitative: <5 m[IU]/mL (ref <5)

## 2018-12-27 MED ORDER — DICYCLOMINE HCL 20 MG PO TABS: 20.0000 mg | ORAL_TABLET | Freq: Two times a day (BID) | ORAL | 0 refills | Status: DC

## 2018-12-27 MED ORDER — SODIUM CHLORIDE 0.9% FLUSH
3.0000 mL | Freq: Once | INTRAVENOUS | Status: AC
  Administered 2018-12-27: 3 mL via INTRAVENOUS

## 2018-12-27 MED ORDER — ONDANSETRON 8 MG PO TBDP: 8.0000 mg | ORAL_TABLET | Freq: Three times a day (TID) | ORAL | 0 refills | Status: DC | PRN

## 2018-12-27 MED ORDER — SODIUM CHLORIDE 0.9 % IV BOLUS
1000.0000 mL | Freq: Once | INTRAVENOUS | Status: AC
  Administered 2018-12-27: 1000 mL via INTRAVENOUS

## 2018-12-27 MED ORDER — DICYCLOMINE HCL 10 MG PO CAPS
20.0000 mg | ORAL_CAPSULE | Freq: Once | ORAL | Status: AC
  Administered 2018-12-27: 20 mg via ORAL
  Filled 2018-12-27: qty 2

## 2018-12-27 MED ORDER — ONDANSETRON HCL 4 MG/2ML IJ SOLN
4.0000 mg | Freq: Once | INTRAMUSCULAR | Status: AC
  Administered 2018-12-27: 4 mg via INTRAVENOUS
  Filled 2018-12-27: qty 2

## 2018-12-27 NOTE — ED Provider Notes (Signed)
[facility] EMERGENCY DEPARTMENT Provider Note   CSN: [id number] Arrival date & time: 12/27/18  1715    History   Chief Complaint Chief Complaint  Patient presents with   Emesis   Abdominal Pain    HPI [name] [name] is a 27 y.o. female.     HPI  [name] [name] is a 28 y.o. female, presenting to the ED with nausea and vomiting beginning yesterday.  Has had "too many to count" episodes of nonbloody, nonbilious emesis.  Onset of periumbilical abdominal pain last night, constant, dull, nonradiating. Accompanied by chills and body aches.  LMP 4/20 and normal. Last BM was earlier today and normal.  Denies chest pain, cough, shortness of breath, upper respiratory symptoms, diarrhea, hematochezia/melena, urinary symptoms, abnormal vaginal discharge/bleeding, or any other complaints.     Past Medical History:  Diagnosis Date   Chlamydia    06/15/14 w/tx, 10/11/14 w/tx, TOC-neg x2   Eczema    Gonorrhea 06/15/14   w/tx, TOC- neg   History of anemia    Infection    chlamydia & gonorrhea   Trichomonas     Patient Active Problem List   Diagnosis Date Noted   Encounter for female sterilization procedure     Past Surgical History:  Procedure Laterality Date   fractured finger as a child     LAPAROSCOPIC TUBAL LIGATION Bilateral 03/16/2015   Procedure: LAPAROSCOPIC TUBAL LIGATION WITH FILSHIE CLIPS;  Surgeon: Tereso Newcomer, MD;  Location: [facility];  Service: Gynecology;  Laterality: Bilateral;  With Filshie clips  Requested 03/16/15 @ 1:00p   WISDOM TOOTH EXTRACTION       OB History    Gravida  5   Para  4   Term  4   Preterm      AB  1   Living  4     SAB  1   TAB      Ectopic      Multiple  0   Live Births  4            Home Medications    Prior to Admission medications   Medication Sig Start Date End Date Taking? Authorizing Provider  cephALEXin (KEFLEX) 500 MG capsule Take 1 capsule (500 mg total) by  mouth 2 (two) times daily. 01/09/17   Law, Waylan Boga, PA-C  dicyclomine (BENTYL) 20 MG tablet Take 1 tablet (20 mg total) by mouth 2 (two) times daily. 12/27/18   Makia Bossi C, PA-C  docusate sodium (COLACE) 100 MG capsule Take 1 capsule (100 mg total) by mouth 2 (two) times daily as needed. 03/16/15   Anyanwu, Jethro Bastos, MD  fluconazole (DIFLUCAN) 150 MG tablet Take 1 tab (150 mg) today and 1 tab (150 mg) in 7 days 10/03/15   Marny Lowenstein, PA-C  ibuprofen (ADVIL,MOTRIN) 600 MG tablet Take 1 tablet (600 mg total) by mouth every 6 (six) hours as needed. 01/09/17   Law, Waylan Boga, PA-C  methocarbamol (ROBAXIN) 500 MG tablet Take 1 tablet (500 mg total) by mouth 2 (two) times daily. 01/09/17   Law, Waylan Boga, PA-C  metroNIDAZOLE (FLAGYL) 500 MG tablet Take 1 tablet (500 mg total) by mouth 2 (two) times daily. 10/03/15   Marny Lowenstein, PA-C  nystatin cream (MYCOSTATIN) Apply to affected area 2 times daily 10/03/15   Marny Lowenstein, PA-C  ondansetron (ZOFRAN ODT) 8 MG disintegrating tablet Take 1 tablet (8 mg total) by mouth every 8 (eight) hours  as needed for nausea or vomiting. 12/27/18   Egan Berkheimer C, PA-C  oxyCODONE-acetaminophen (PERCOCET/ROXICET) 5-325 MG per tablet Take 1-2 tablets by mouth every 6 (six) hours as needed. 03/16/15   Anyanwu, Jethro Bastos, MD  sertraline (ZOLOFT) 50 MG tablet Take 1 tablet (50 mg total) by mouth daily. Patient not taking: Reported on 03/02/2015 03/01/15   Tereso Newcomer, MD  triamcinolone cream (KENALOG) 0.1 % Apply 1 application topically 2 (two) times daily. 10/03/15   Marny Lowenstein, PA-C    Family History Family History  Problem Relation Age of Onset   Asthma Sister    Heart disease Maternal Grandmother    Other Neg Hx     Social History Social History   Tobacco Use   Smoking status: Never Smoker   Smokeless tobacco: Never Used  Substance Use Topics   Alcohol use: No   Drug use: No     Allergies   Patient has no known allergies.   Review of  Systems Review of Systems  Constitutional: Positive for chills and fever.  Respiratory: Negative for cough and shortness of breath.   Cardiovascular: Negative for chest pain.  Gastrointestinal: Positive for abdominal pain, nausea and vomiting. Negative for blood in stool and diarrhea.  Genitourinary: Negative for dysuria, flank pain, frequency, hematuria, vaginal bleeding and vaginal discharge.  Musculoskeletal: Negative for back pain.  Neurological: Negative for weakness and numbness.  All other systems reviewed and are negative.    Physical Exam Updated Vital Signs BP (!) 103/58 (BP Location: Left Arm)    Pulse 67    Temp 98.5 F (36.9 C) (Oral)    Resp 16    LMP 12/14/2018    SpO2 99%   Physical Exam Vitals signs and nursing note reviewed.  Constitutional:      General: She is not in acute distress.    Appearance: She is well-developed. She is not diaphoretic.  HENT:     Head: Normocephalic and atraumatic.     Mouth/Throat:     Mouth: Mucous membranes are moist.     Pharynx: Oropharynx is clear.  Eyes:     Conjunctiva/sclera: Conjunctivae normal.  Neck:     Musculoskeletal: Neck supple.  Cardiovascular:     Rate and Rhythm: Normal rate and regular rhythm.     Pulses: Normal pulses.     Heart sounds: Normal heart sounds.     Comments: Tactile temperature in the extremities appropriate and equal bilaterally. Pulmonary:     Effort: Pulmonary effort is normal. No respiratory distress.     Breath sounds: Normal breath sounds.  Abdominal:     Palpations: Abdomen is soft.     Tenderness: There is abdominal tenderness in the epigastric area and periumbilical area. There is no right CVA tenderness, left CVA tenderness, guarding or rebound. Negative signs include Murphy's sign, Rovsing's sign and McBurney's sign.     Comments: Absolutely no tenderness in the lower abdomen.  No tenderness in the right upper quadrant.  Musculoskeletal:     Right lower leg: No edema.     Left  lower leg: No edema.  Lymphadenopathy:     Cervical: No cervical adenopathy.  Skin:    General: Skin is warm and dry.  Neurological:     Mental Status: She is alert.  Psychiatric:        Mood and Affect: Mood and affect normal.        Speech: Speech normal.        Behavior:  Behavior normal.      ED Treatments / Results  Labs (all labs ordered are listed, but only abnormal results are displayed) Labs Reviewed  COMPREHENSIVE METABOLIC PANEL - Abnormal; Notable for the following components:      Result Value   Potassium 3.3 (*)    Total Protein 8.4 (*)    All other components within normal limits  CBC - Abnormal; Notable for the following components:   Hemoglobin 9.3 (*)    HCT 30.4 (*)    MCV 77.2 (*)    MCH 23.6 (*)    RDW 16.5 (*)    All other components within normal limits  URINALYSIS, ROUTINE W REFLEX MICROSCOPIC - Abnormal; Notable for the following components:   APPearance HAZY (*)    Hgb urine dipstick SMALL (*)    Ketones, ur 80 (*)    Protein, ur 30 (*)    Leukocytes,Ua LARGE (*)    Bacteria, UA RARE (*)    Non Squamous Epithelial 0-5 (*)    All other components within normal limits  LIPASE, BLOOD  I-STAT BETA HCG BLOOD, ED ([facility], [facility], [facility] ONLY)    EKG None  Radiology No results found.  Procedures Procedures (including critical care time)  Medications Ordered in ED Medications  sodium chloride flush (NS) 0.9 % injection 3 mL (3 mLs Intravenous Given 12/27/18 2043)  sodium chloride 0.9 % bolus 1,000 mL (0 mLs Intravenous Stopped 12/27/18 2207)  ondansetron (ZOFRAN) injection 4 mg (4 mg Intravenous Given 12/27/18 2043)  dicyclomine (BENTYL) capsule 20 mg (20 mg Oral Given 12/27/18 2043)     Initial Impression / Assessment and Plan / ED Course  I have reviewed the triage vital signs and the nursing notes.  Pertinent labs & imaging results that were available during my care of the patient were reviewed by me and considered in my medical decision making (see  chart for details).  Clinical Course as of Dec 26 2321  Wynelle LinkSun Dec 27, 2018  1955 Consistent with previous values.  Hemoglobin(!): 9.3 [SJ]  2136 Patient states she feels much better.  Abdominal pain is resolved.  Abdominal exam benign.  Nausea has improved.   [SJ]    Clinical Course User Index [SJ] Shavaun Osterloh C, PA-C       Patient presents with nausea and vomiting.  Abdominal discomfort began while after vomiting. Patient is nontoxic appearing, afebrile, not tachycardic, not tachypneic, not hypotensive, maintains excellent SPO2 on room air.  No leukocytosis. Mild hypokalemia suspected to be due to patient's vomiting.  Also has evidence of some dehydration with ketonuria. Patient symptoms resolved here in the ED and did not recur.  Subsequent abdominal exams benign.  Tolerating PO fluids at time of discharge. The patient was given instructions for home care as well as strict return precautions. Patient voices understanding of these instructions, accepts the plan, and is comfortable with discharge.   Vitals:   12/27/18 1718 12/27/18 2046 12/27/18 2048  BP: (!) 103/58  (!) 112/55  Pulse: 67  60  Resp: 16  16  Temp: 98.5 F (36.9 C)  98.3 F (36.8 C)  TempSrc: Oral  Oral  SpO2: 99%  100%  Weight:  72.6 kg   Height:  5\' 4"  (1.626 m)      Final Clinical Impressions(s) / ED Diagnoses   Final diagnoses:  Non-intractable vomiting with nausea, unspecified vomiting type  Periumbilical abdominal pain    ED Discharge Orders         Ordered  ondansetron (ZOFRAN ODT) 8 MG disintegrating tablet  Every 8 hours PRN     12/27/18 2137    dicyclomine (BENTYL) 20 MG tablet  2 times daily     12/27/18 2137           Concepcion Living 12/27/18 2327    Pricilla Loveless, MD 12/30/18 1148

## 2018-12-27 NOTE — ED Notes (Signed)
Patient tolerated PO fluids well

## 2018-12-27 NOTE — ED Triage Notes (Signed)
C/o generalized abd pain, nausea, and vomiting since yesterday.  Reports diarrhea x 2 yesterday.  Also reports chills.

## 2018-12-27 NOTE — Discharge Instructions (Addendum)
Nausea, Vomiting, and Abdominal pain  Hand washing: Wash your hands throughout the day, but especially before and after touching the face, using the restroom, sneezing, coughing, or touching surfaces that have been coughed or sneezed upon. Hydration: Symptoms will be intensified and complicated by dehydration. Dehydration can also extend the duration of symptoms. Drink plenty of fluids and get plenty of rest. You should be drinking at least half a liter of water an hour to stay hydrated. Electrolyte drinks (ex. Gatorade, Powerade, Pedialyte) are also encouraged. You should be drinking enough fluids to make your urine light yellow, almost clear. If this is not the case, you are not drinking enough water. Please note that some of the treatments indicated below will not be effective if you are not adequately hydrated. Diet: Please concentrate on hydration, however, you may introduce food slowly.  Start with a clear liquid diet, progressed to a full liquid diet, and then bland solids as you are able. Pain or fever: Ibuprofen, Naproxen, or Tylenol for pain or fever.  Nausea/vomiting: Use the Zofran for nausea or vomiting. Diarrhea: May use medications such as loperamide (Imodium) or Bismuth subsalicylate (Pepto-Bismol). Bentyl: This medication is what is known as an antispasmodic and is intended to help reduce abdominal discomfort. Follow-up: Follow-up with a primary care provider on this matter. Return: Return should you develop a fever, bloody diarrhea, increased abdominal pain, uncontrolled vomiting, or any other major concerns.  Also return to the ED if the abdominal pain moves towards your right hip or under the right side of your ribs.  For prescription assistance, may try using prescription discount sites or apps, such as goodrx.com

## 2018-12-29 ENCOUNTER — Emergency Department (HOSPITAL_COMMUNITY)
Admission: EM | Admit: 2018-12-29 | Discharge: 2018-12-29 | Disposition: A | Discharge status: Home / Self Care | Payer: Self-pay | Attending: Emergency Medicine | Admitting: Emergency Medicine

## 2018-12-29 ENCOUNTER — Other Ambulatory Visit: Payer: Self-pay

## 2018-12-29 DIAGNOSIS — Z79899 Other long term (current) drug therapy: Secondary | ICD-10-CM | POA: Insufficient documentation

## 2018-12-29 DIAGNOSIS — R109 Unspecified abdominal pain: Secondary | ICD-10-CM | POA: Insufficient documentation

## 2018-12-29 DIAGNOSIS — R112 Nausea with vomiting, unspecified: Secondary | ICD-10-CM | POA: Insufficient documentation

## 2018-12-29 LAB — COMPREHENSIVE METABOLIC PANEL
ALT: 25 U/L (ref 0–44)
AST: 21 U/L (ref 15–41)
Albumin: 4.4 g/dL (ref 3.5–5.0)
Alkaline Phosphatase: 60 U/L (ref 38–126)
Anion gap: 15 (ref 5–15)
BUN: 10 mg/dL (ref 6–20)
CO2: 23 mmol/L (ref 22–32)
Calcium: 9.2 mg/dL (ref 8.9–10.3)
Chloride: 100 mmol/L (ref 98–111)
Creatinine, Ser: 0.85 mg/dL (ref 0.44–1.00)
GFR calc Af Amer: >60 mL/min (ref >60)
GFR calc non Af Amer: >60 mL/min (ref >60)
Glucose, Bld: 94 mg/dL (ref 70–99)
Potassium: 3.5 mmol/L (ref 3.5–5.1)
Sodium: 138 mmol/L (ref 135–145)
Total Bilirubin: 0.9 mg/dL (ref 0.3–1.2)
Total Protein: 8 g/dL (ref 6.5–8.1)

## 2018-12-29 LAB — CBC WITH DIFFERENTIAL/PLATELET
Abs Immature Granulocytes: 0.02 10*3/uL (ref 0.00–0.07)
Basophils Absolute: 0.1 10*3/uL (ref 0.0–0.1)
Basophils Relative: 1 %
Eosinophils Absolute: 0 10*3/uL (ref 0.0–0.5)
Eosinophils Relative: 0 %
HCT: 32.3 % — ABNORMAL LOW (ref 36.0–46.0)
Hemoglobin: 9.9 g/dL — ABNORMAL LOW (ref 12.0–15.0)
Immature Granulocytes: 0 %
Lymphocytes Relative: 26 %
Lymphs Abs: 1.7 10*3/uL (ref 0.7–4.0)
MCH: 23.8 pg — ABNORMAL LOW (ref 26.0–34.0)
MCHC: 30.7 g/dL (ref 30.0–36.0)
MCV: 77.6 fL — ABNORMAL LOW (ref 80.0–100.0)
Monocytes Absolute: 0.5 10*3/uL (ref 0.1–1.0)
Monocytes Relative: 7 %
Neutro Abs: 4.4 10*3/uL (ref 1.7–7.7)
Neutrophils Relative %: 66 %
Platelets: 298 10*3/uL (ref 150–400)
RBC: 4.16 MIL/uL (ref 3.87–5.11)
RDW: 15.9 % — ABNORMAL HIGH (ref 11.5–15.5)
WBC: 6.6 10*3/uL (ref 4.0–10.5)
nRBC: 0 % (ref 0.0–0.2)

## 2018-12-29 LAB — URINALYSIS, ROUTINE W REFLEX MICROSCOPIC
Bilirubin Urine: NEGATIVE
Glucose, UA: NEGATIVE mg/dL
Hgb urine dipstick: NEGATIVE
Ketones, ur: 80 mg/dL — AB
Nitrite: NEGATIVE
Protein, ur: 30 mg/dL — AB
Specific Gravity, Urine: 1.028 (ref 1.005–1.030)
pH: 7 (ref 5.0–8.0)

## 2018-12-29 LAB — RAPID URINE DRUG SCREEN, HOSP PERFORMED
Amphetamines: NOT DETECTED
Barbiturates: NOT DETECTED
Benzodiazepines: NOT DETECTED
Cocaine: NOT DETECTED
Opiates: NOT DETECTED
Tetrahydrocannabinol: POSITIVE — AB

## 2018-12-29 LAB — I-STAT BETA HCG BLOOD, ED (MC, WL, AP ONLY): I-stat hCG, quantitative: <5 m[IU]/mL (ref <5)

## 2018-12-29 LAB — LIPASE, BLOOD: Lipase: 51 U/L (ref 11–51)

## 2018-12-29 MED ORDER — CAPSAICIN 0.025 % EX CREA
TOPICAL_CREAM | Freq: Once | CUTANEOUS | Status: AC
  Administered 2018-12-29: 09:00:00 via TOPICAL
  Filled 2018-12-29: qty 60

## 2018-12-29 MED ORDER — DICYCLOMINE HCL 20 MG PO TABS: 20.0000 mg | ORAL_TABLET | Freq: Two times a day (BID) | ORAL | 0 refills | Status: DC

## 2018-12-29 MED ORDER — ONDANSETRON HCL 4 MG PO TABS: 4.0000 mg | ORAL_TABLET | Freq: Three times a day (TID) | ORAL | 0 refills | Status: DC | PRN

## 2018-12-29 MED ORDER — SODIUM CHLORIDE 0.9 % IV BOLUS
1000.0000 mL | Freq: Once | INTRAVENOUS | Status: AC
  Administered 2018-12-29: 1000 mL via INTRAVENOUS

## 2018-12-29 MED ORDER — DICYCLOMINE HCL 10 MG/ML IM SOLN
20.0000 mg | Freq: Once | INTRAMUSCULAR | Status: AC
  Administered 2018-12-29: 20 mg via INTRAMUSCULAR
  Filled 2018-12-29: qty 2

## 2018-12-29 MED ORDER — PROMETHAZINE HCL 25 MG/ML IJ SOLN
25.0000 mg | Freq: Once | INTRAMUSCULAR | Status: AC
  Administered 2018-12-29: 25 mg via INTRAVENOUS
  Filled 2018-12-29: qty 1

## 2018-12-29 MED ORDER — FAMOTIDINE IN NACL 20-0.9 MG/50ML-% IV SOLN
20.0000 mg | Freq: Once | INTRAVENOUS | Status: AC
  Administered 2018-12-29: 20 mg via INTRAVENOUS
  Filled 2018-12-29: qty 50

## 2018-12-29 MED ORDER — FAMOTIDINE 20 MG PO TABS: 20.0000 mg | ORAL_TABLET | Freq: Two times a day (BID) | ORAL | 0 refills | Status: DC

## 2018-12-29 NOTE — Discharge Instructions (Signed)
Please take medications as directed.   A culture was sent of your urine today to determine if there is any bacterial growth. If the results of the culture are positive and you require an antibiotic or a change of your prescribed antibiotic you will be contacted by the hospital. If the results are negative you will not be contacted.  Please follow up with your primary doctor within the next 5-7 days.  If you do not have a primary care provider, information for a healthcare clinic has been provided for you to make arrangements for follow up care. Please return to the ER sooner if you have any new or worsening symptoms, or if you have any of the following symptoms:  Abdominal pain that does not go away.  You have a fever.  You keep throwing up (vomiting).  The pain is felt only in portions of the abdomen. Pain in the right side could possibly be appendicitis. In an adult, pain in the left lower portion of the abdomen could be colitis or diverticulitis.  You pass bloody or black tarry stools.  There is bright red blood in the stool.  The constipation stays for more than 4 days.  There is belly (abdominal) or rectal pain.  You do not seem to be getting better.  You have any questions or concerns.

## 2018-12-29 NOTE — ED Provider Notes (Signed)
[facility] EMERGENCY DEPARTMENT Provider Note   CSN: [id number] Arrival date & time: 12/29/18  7289    History   Chief Complaint Chief Complaint  Patient presents with  . Abdominal Pain  . Emesis    HPI [name] [name] is a 28 y.o. female.     HPI   Pt is a 28 y/o female with a h/o anemia, STIs, tubal ligation, who presents to the ED today for eval of NV and abd pain. Pt states she began to have nausea and vomiting starting 4 days ago. After developing NV, she began to have abd pain as well. Pain located to the epigastric and periumbilical area. Pain rated 8-9/10. Pain is nonradiating. Pain feels like a dull, throbbing, aching pain. No hematemesis. States she has probably vomited about 10 times. No diarrhea. States she is not constipated but also states she has not had a BM in 4 days. No dysuria, frequency, hematuria, vaginal bleeding, vaginal discharge. She has tried taking the zofran and bentyl she was prescribed without relief. States she used marijuana 5 days ago. Denies ETOH use.  Reviewed records, pt seen 12/27/18 for same. Was given IVF, zofran, bentyl and sxs improved. She was discharged home in stable condition.   Past Medical History:  Diagnosis Date  . Chlamydia    06/15/14 w/tx, 10/11/14 w/tx, TOC-neg x2  . Eczema   . Gonorrhea 06/15/14   w/tx, TOC- neg  . History of anemia   . Infection    chlamydia & gonorrhea  . Trichomonas     Patient Active Problem List   Diagnosis Date Noted  . Encounter for female sterilization procedure     Past Surgical History:  Procedure Laterality Date  . fractured finger as a child    . LAPAROSCOPIC TUBAL LIGATION Bilateral 03/16/2015   Procedure: LAPAROSCOPIC TUBAL LIGATION WITH FILSHIE CLIPS;  Surgeon: Tereso Newcomer, MD;  Location: [facility];  Service: Gynecology;  Laterality: Bilateral;  With Filshie clips  Requested 03/16/15 @ 1:00p  . WISDOM TOOTH EXTRACTION       OB History    Gravida  5   Para   4   Term  4   Preterm      AB  1   Living  4     SAB  1   TAB      Ectopic      Multiple  0   Live Births  4            Home Medications    Prior to Admission medications   Medication Sig Start Date End Date Taking? Authorizing Provider  cephALEXin (KEFLEX) 500 MG capsule Take 1 capsule (500 mg total) by mouth 2 (two) times daily. 01/09/17   Law, Waylan Boga, PA-C  dicyclomine (BENTYL) 20 MG tablet Take 1 tablet (20 mg total) by mouth 2 (two) times daily for 5 days. 12/29/18 01/03/19  Amir Fick S, PA-C  docusate sodium (COLACE) 100 MG capsule Take 1 capsule (100 mg total) by mouth 2 (two) times daily as needed. 03/16/15   Anyanwu, Jethro Bastos, MD  famotidine (PEPCID) 20 MG tablet Take 1 tablet (20 mg total) by mouth 2 (two) times daily for 14 days. 12/29/18 01/12/19  Su Duma S, PA-C  fluconazole (DIFLUCAN) 150 MG tablet Take 1 tab (150 mg) today and 1 tab (150 mg) in 7 days 10/03/15   Marny Lowenstein, PA-C  ibuprofen (ADVIL,MOTRIN) 600 MG tablet Take 1 tablet (600 mg  total) by mouth every 6 (six) hours as needed. 01/09/17   Law, Waylan Boga, PA-C  methocarbamol (ROBAXIN) 500 MG tablet Take 1 tablet (500 mg total) by mouth 2 (two) times daily. 01/09/17   Law, Waylan Boga, PA-C  metroNIDAZOLE (FLAGYL) 500 MG tablet Take 1 tablet (500 mg total) by mouth 2 (two) times daily. 10/03/15   Marny Lowenstein, PA-C  nystatin cream (MYCOSTATIN) Apply to affected area 2 times daily 10/03/15   Marny Lowenstein, PA-C  ondansetron (ZOFRAN ODT) 8 MG disintegrating tablet Take 1 tablet (8 mg total) by mouth every 8 (eight) hours as needed for nausea or vomiting. 12/27/18   Joy, Shawn C, PA-C  ondansetron (ZOFRAN) 4 MG tablet Take 1 tablet (4 mg total) by mouth every 8 (eight) hours as needed for nausea or vomiting. 12/29/18   Jourdin Gens S, PA-C  oxyCODONE-acetaminophen (PERCOCET/ROXICET) 5-325 MG per tablet Take 1-2 tablets by mouth every 6 (six) hours as needed. 03/16/15   Anyanwu, Jethro Bastos, MD   sertraline (ZOLOFT) 50 MG tablet Take 1 tablet (50 mg total) by mouth daily. Patient not taking: Reported on 03/02/2015 03/01/15   Tereso Newcomer, MD  triamcinolone cream (KENALOG) 0.1 % Apply 1 application topically 2 (two) times daily. 10/03/15   Marny Lowenstein, PA-C    Family History Family History  Problem Relation Age of Onset  . Asthma Sister   . Heart disease Maternal Grandmother   . Other Neg Hx     Social History Social History   Tobacco Use  . Smoking status: Never Smoker  . Smokeless tobacco: Never Used  Substance Use Topics  . Alcohol use: No  . Drug use: No     Allergies   Patient has no known allergies.   Review of Systems Review of Systems  Constitutional: Positive for chills (only when she vomits). Negative for fever.  HENT: Negative for ear pain and sore throat.   Eyes: Negative for visual disturbance.  Respiratory: Negative for cough and shortness of breath.   Cardiovascular: Negative for chest pain.  Gastrointestinal: Positive for abdominal pain, nausea and vomiting. Negative for constipation and diarrhea.  Genitourinary: Negative for dysuria and hematuria.  Musculoskeletal: Negative for back pain.  Skin: Negative for rash.  Neurological: Negative for headaches.  All other systems reviewed and are negative.    Physical Exam Updated Vital Signs BP 121/74 (BP Location: Left Arm)   Pulse 75   Temp 97.9 F (36.6 C) (Oral)   Resp 16   Ht  (1.626 m)   Wt 73.5 kg   LMP 12/14/2018   SpO2 100%   BMI 27.81 kg/m   Physical Exam Vitals signs and nursing note reviewed.  Constitutional:      General: She is not in acute distress.    Appearance: She is well-developed. She is not ill-appearing or toxic-appearing.  HENT:     Head: Normocephalic and atraumatic.  Eyes:     Conjunctiva/sclera: Conjunctivae normal.  Neck:     Musculoskeletal: Neck supple.  Cardiovascular:     Rate and Rhythm: Normal rate and regular rhythm.     Heart sounds:  Normal heart sounds. No murmur.  Pulmonary:     Effort: Pulmonary effort is normal. No respiratory distress.     Breath sounds: Normal breath sounds. No wheezing, rhonchi or rales.  Abdominal:     General: Bowel sounds are normal.     Palpations: Abdomen is soft.     Comments: Mild epigastric  tenderness. No guarding, rebound, or rigidity.   Skin:    General: Skin is warm and dry.  Neurological:     Mental Status: She is alert.      ED Treatments / Results  Labs (all labs ordered are listed, but only abnormal results are displayed) Labs Reviewed  CBC WITH DIFFERENTIAL/PLATELET - Abnormal; Notable for the following components:      Result Value   Hemoglobin 9.9 (*)    HCT 32.3 (*)    MCV 77.6 (*)    MCH 23.8 (*)    RDW 15.9 (*)    All other components within normal limits  URINALYSIS, ROUTINE W REFLEX MICROSCOPIC - Abnormal; Notable for the following components:   APPearance HAZY (*)    Ketones, ur 80 (*)    Protein, ur 30 (*)    Leukocytes,Ua MODERATE (*)    Bacteria, UA MANY (*)    All other components within normal limits  RAPID URINE DRUG SCREEN, HOSP PERFORMED - Abnormal; Notable for the following components:   Tetrahydrocannabinol POSITIVE (*)    All other components within normal limits  URINE CULTURE  COMPREHENSIVE METABOLIC PANEL  LIPASE, BLOOD  I-STAT BETA HCG BLOOD, ED ([facility], [facility], [facility] ONLY)    EKG None  Radiology No results found.  Procedures Procedures (including critical care time)  Medications Ordered in ED Medications  sodium chloride 0.9 % bolus 1,000 mL (1,000 mLs Intravenous New Bag/Given 12/29/18 0845)  dicyclomine (BENTYL) injection 20 mg (20 mg Intramuscular Given 12/29/18 0848)  promethazine (PHENERGAN) injection 25 mg (25 mg Intravenous Given 12/29/18 0846)  capsaicin (ZOSTRIX) 0.025 % cream ( Topical Given 12/29/18 0855)  famotidine (PEPCID) IVPB 20 mg premix (0 mg Intravenous Stopped 12/29/18 0921)     Initial Impression / Assessment and Plan /  ED Course  I have reviewed the triage vital signs and the nursing notes.  Pertinent labs & imaging results that were available during my care of the patient were reviewed by me and considered in my medical decision making (see chart for details).   Final Clinical Impressions(s) / ED Diagnoses   Final diagnoses:  Abdominal pain, unspecified abdominal location  Nausea and vomiting, intractability of vomiting not specified, unspecified vomiting type   Pt presenting with NV and abd pain starting 4 days ago.   Afebrile, normal vitals. Pt well appearing, nontoxic.   Minimal epigastric tenderness without rebound or guarding.   Labs are reassuring.  CBC is without leukocytosis.  Anemia is present but is at baseline for patient.  Her electrolytes are completely normal.  There is no changes in liver or kidney function.  Normal bilirubin.  Lipase is negative.  UA shows moderate leuks, 0-5 RBCs, 11-20 white blood cells, many bacteria and many squamous epithelial cells, patient is asymptomatic.  I do not feel that this is a clean sample and therefore will defer treatment until culture results.  Patient aware of plan.  UDS is positive for marijuana.    Patient given IV fluids, Bentyl, antiemetics, Pepcid and capsaicin cream.  On reevaluation states her nausea has improved.  She has had no episodes of vomiting in the ED and has been able to tolerate p.o.  States she has minimal epigastric pain.  On exam she has no significant tenderness, nonsurgical abdomen.  Do not feel that patient needs urgent imaging or further work-up at this time.  Discussed possibility that nausea vomiting and abdominal discomfort could be secondary to marijuana use.  Advised her to stop using  marijuana.  She voices understanding and is in agreement with plan.  Will give Rx for antiemetics, Bentyl and Pepcid for discharge.  Advised PCP follow-up and return if worse.  ED Discharge Orders         Ordered    famotidine (PEPCID) 20 MG  tablet  2 times daily     12/29/18 0957    ondansetron (ZOFRAN) 4 MG tablet  Every 8 hours PRN     12/29/18 0957    dicyclomine (BENTYL) 20 MG tablet  2 times daily     12/29/18 0957           Karrie Meres, PA-C 12/29/18 0957    Tegeler, Canary Brim, MD 12/29/18 [phone]

## 2018-12-29 NOTE — ED Triage Notes (Signed)
Pt here for evaluation of central abdominal pain, N/V since Saturday. Sts rx from Sunday are not working.

## 2018-12-30 LAB — URINE CULTURE

## 2018-12-31 ENCOUNTER — Other Ambulatory Visit: Payer: Self-pay

## 2018-12-31 ENCOUNTER — Encounter (HOSPITAL_COMMUNITY): Payer: Self-pay | Admitting: Emergency Medicine

## 2018-12-31 ENCOUNTER — Emergency Department (HOSPITAL_COMMUNITY): Payer: Self-pay

## 2018-12-31 ENCOUNTER — Emergency Department (HOSPITAL_COMMUNITY)
Admission: EM | Admit: 2018-12-31 | Discharge: 2018-12-31 | Disposition: A | Discharge status: Home / Self Care | Payer: Self-pay | Attending: Emergency Medicine | Admitting: Emergency Medicine

## 2018-12-31 DIAGNOSIS — F1290 Cannabis use, unspecified, uncomplicated: Secondary | ICD-10-CM

## 2018-12-31 DIAGNOSIS — Z79899 Other long term (current) drug therapy: Secondary | ICD-10-CM | POA: Insufficient documentation

## 2018-12-31 DIAGNOSIS — R109 Unspecified abdominal pain: Secondary | ICD-10-CM | POA: Insufficient documentation

## 2018-12-31 DIAGNOSIS — R112 Nausea with vomiting, unspecified: Secondary | ICD-10-CM

## 2018-12-31 DIAGNOSIS — E876 Hypokalemia: Secondary | ICD-10-CM

## 2018-12-31 LAB — CBC WITH DIFFERENTIAL/PLATELET
Abs Immature Granulocytes: 0.02 10*3/uL (ref 0.00–0.07)
Basophils Absolute: 0 10*3/uL (ref 0.0–0.1)
Basophils Relative: 0 %
Eosinophils Absolute: 0 10*3/uL (ref 0.0–0.5)
Eosinophils Relative: 0 %
HCT: 32.5 % — ABNORMAL LOW (ref 36.0–46.0)
Hemoglobin: 9.7 g/dL — ABNORMAL LOW (ref 12.0–15.0)
Immature Granulocytes: 0 %
Lymphocytes Relative: 10 %
Lymphs Abs: 0.9 10*3/uL (ref 0.7–4.0)
MCH: 23.3 pg — ABNORMAL LOW (ref 26.0–34.0)
MCHC: 29.8 g/dL — ABNORMAL LOW (ref 30.0–36.0)
MCV: 77.9 fL — ABNORMAL LOW (ref 80.0–100.0)
Monocytes Absolute: 0.5 10*3/uL (ref 0.1–1.0)
Monocytes Relative: 6 %
Neutro Abs: 7.3 10*3/uL (ref 1.7–7.7)
Neutrophils Relative %: 84 %
Platelets: 259 10*3/uL (ref 150–400)
RBC: 4.17 MIL/uL (ref 3.87–5.11)
RDW: 16.2 % — ABNORMAL HIGH (ref 11.5–15.5)
WBC: 8.7 10*3/uL (ref 4.0–10.5)
nRBC: 0 % (ref 0.0–0.2)

## 2018-12-31 LAB — URINALYSIS, ROUTINE W REFLEX MICROSCOPIC
Bilirubin Urine: NEGATIVE
Glucose, UA: NEGATIVE mg/dL
Hgb urine dipstick: NEGATIVE
Ketones, ur: 80 mg/dL — AB
Nitrite: NEGATIVE
Protein, ur: 100 mg/dL — AB
Specific Gravity, Urine: 1.029 (ref 1.005–1.030)
pH: 7 (ref 5.0–8.0)

## 2018-12-31 LAB — LIPASE, BLOOD: Lipase: 34 U/L (ref 11–51)

## 2018-12-31 LAB — CBG MONITORING, ED: Glucose-Capillary: 87 mg/dL (ref 70–99)

## 2018-12-31 LAB — RAPID URINE DRUG SCREEN, HOSP PERFORMED
Amphetamines: NOT DETECTED
Barbiturates: NOT DETECTED
Benzodiazepines: NOT DETECTED
Cocaine: NOT DETECTED
Opiates: NOT DETECTED
Tetrahydrocannabinol: POSITIVE — AB

## 2018-12-31 LAB — COMPREHENSIVE METABOLIC PANEL
ALT: 26 U/L (ref 0–44)
AST: 24 U/L (ref 15–41)
Albumin: 4.5 g/dL (ref 3.5–5.0)
Alkaline Phosphatase: 59 U/L (ref 38–126)
Anion gap: 15 (ref 5–15)
BUN: 13 mg/dL (ref 6–20)
CO2: 22 mmol/L (ref 22–32)
Calcium: 9.2 mg/dL (ref 8.9–10.3)
Chloride: 100 mmol/L (ref 98–111)
Creatinine, Ser: 0.71 mg/dL (ref 0.44–1.00)
GFR calc Af Amer: >60 mL/min (ref >60)
GFR calc non Af Amer: >60 mL/min (ref >60)
Glucose, Bld: 104 mg/dL — ABNORMAL HIGH (ref 70–99)
Potassium: 2.7 mmol/L — CL (ref 3.5–5.1)
Sodium: 137 mmol/L (ref 135–145)
Total Bilirubin: 1 mg/dL (ref 0.3–1.2)
Total Protein: 8.5 g/dL — ABNORMAL HIGH (ref 6.5–8.1)

## 2018-12-31 LAB — PREGNANCY, URINE: Preg Test, Ur: NEGATIVE

## 2018-12-31 MED ORDER — SODIUM CHLORIDE 0.9 % IV BOLUS
1000.0000 mL | Freq: Once | INTRAVENOUS | Status: AC
  Administered 2018-12-31: 1000 mL via INTRAVENOUS

## 2018-12-31 MED ORDER — IOHEXOL 300 MG/ML  SOLN
100.0000 mL | Freq: Once | INTRAMUSCULAR | Status: AC | PRN
  Administered 2018-12-31: 100 mL via INTRAVENOUS

## 2018-12-31 MED ORDER — PROMETHAZINE HCL 25 MG PO TABS: 25.0000 mg | ORAL_TABLET | Freq: Four times a day (QID) | ORAL | 0 refills | Status: DC | PRN

## 2018-12-31 MED ORDER — FAMOTIDINE IN NACL 20-0.9 MG/50ML-% IV SOLN
20.0000 mg | Freq: Once | INTRAVENOUS | Status: AC
  Administered 2018-12-31: 20 mg via INTRAVENOUS
  Filled 2018-12-31: qty 50

## 2018-12-31 MED ORDER — POTASSIUM CHLORIDE CRYS ER 20 MEQ PO TBCR
40.0000 meq | EXTENDED_RELEASE_TABLET | Freq: Once | ORAL | Status: AC
  Administered 2018-12-31: 40 meq via ORAL
  Filled 2018-12-31: qty 2

## 2018-12-31 MED ORDER — SODIUM CHLORIDE (PF) 0.9 % IJ SOLN
INTRAMUSCULAR | Status: AC
  Filled 2018-12-31: qty 50

## 2018-12-31 MED ORDER — PROMETHAZINE HCL 25 MG RE SUPP: 25.0000 mg | Freq: Three times a day (TID) | RECTAL | 0 refills | Status: DC | PRN

## 2018-12-31 MED ORDER — PROMETHAZINE HCL 25 MG/ML IJ SOLN
12.5000 mg | Freq: Once | INTRAMUSCULAR | Status: AC
  Administered 2018-12-31: 13:00:00 12.5 mg via INTRAVENOUS
  Filled 2018-12-31: qty 1

## 2018-12-31 NOTE — Discharge Instructions (Signed)
Eat a bland diet, avoiding greasy, fatty, fried foods, as well as spicy and acidic foods or beverages.  Avoid eating within 2 to 3 hours before going to bed or laying down.  Also avoid teas, colas, coffee, chocolate, pepermint and spearment.  May also take over the counter maalox/mylanta, as directed on packaging, as needed for discomfort.  Take the new prescriptions as directed. Continue to take the pepcid prescription received at your previous ED visit.   Increase your fluid intake (ie:  Gatoraide) for the next few days. Call your regular medical doctor tomorrow to schedule a follow up appointment this week.  Return to the Emergency Department immediately sooner if worsening.

## 2018-12-31 NOTE — ED Provider Notes (Signed)
[facility] Provider Note   CSN: [id number] Arrival date & time: 12/31/18  1212    History   Chief Complaint Chief Complaint  Patient presents with  . Emesis  . Abdominal Pain    HPI [name] [name] is a 28 y.o. female.     HPI  Pt was seen at 1230.  Per pt, c/o gradual onset and persistence of constant generalized abd "pain" for the past 5 days.  Has been associated with multiple intermittent episodes of N/V.  Describes the abd pain as "dull." Pt has been evaluated in the ED x2 previously this week for these symptoms, rx pepcid, zofran and bentyl. Pt states her symptoms will "ease off a little bit" after taking the meds, but "come right back." Pt has been unable to take PO due to her symptoms. Last BM this morning was "normal" for her.  Denies cough, known COVID+ exposure. Denies diarrhea, no fevers, no back pain, no rash, no CP/SOB, no black or blood in stools or emesis. The symptoms have been associated with no other complaints. The patient has a significant history of similar symptoms previously, recently being evaluated for this complaint and multiple prior evals for same this past week.       Past Medical History:  Diagnosis Date  . Chlamydia    06/15/14 w/tx, 10/11/14 w/tx, TOC-neg x2  . Eczema   . Gonorrhea 06/15/14   w/tx, TOC- neg  . History of anemia   . Infection    chlamydia & gonorrhea  . Trichomonas     Patient Active Problem List   Diagnosis Date Noted  . Encounter for female sterilization procedure     Past Surgical History:  Procedure Laterality Date  . fractured finger as a child    . LAPAROSCOPIC TUBAL LIGATION Bilateral 03/16/2015   Procedure: LAPAROSCOPIC TUBAL LIGATION WITH FILSHIE CLIPS;  Surgeon: Tereso Newcomer, MD;  Location: [facility];  Service: Gynecology;  Laterality: Bilateral;  With Filshie clips  Requested 03/16/15 @ 1:00p  . WISDOM TOOTH EXTRACTION       OB History    Gravida  5   Para  4   Term  4   Preterm      AB  1   Living  4     SAB  1   TAB      Ectopic      Multiple  0   Live Births  4            Home Medications    Prior to Admission medications   Medication Sig Start Date End Date Taking? Authorizing Provider  cephALEXin (KEFLEX) 500 MG capsule Take 1 capsule (500 mg total) by mouth 2 (two) times daily. 01/09/17   Law, Waylan Boga, PA-C  dicyclomine (BENTYL) 20 MG tablet Take 1 tablet (20 mg total) by mouth 2 (two) times daily for 5 days. 12/29/18 01/03/19  Couture, Cortni S, PA-C  docusate sodium (COLACE) 100 MG capsule Take 1 capsule (100 mg total) by mouth 2 (two) times daily as needed. 03/16/15   Anyanwu, Jethro Bastos, MD  famotidine (PEPCID) 20 MG tablet Take 1 tablet (20 mg total) by mouth 2 (two) times daily for 14 days. 12/29/18 01/12/19  Couture, Cortni S, PA-C  fluconazole (DIFLUCAN) 150 MG tablet Take 1 tab (150 mg) today and 1 tab (150 mg) in 7 days 10/03/15   Marny Lowenstein, PA-C  ibuprofen (ADVIL,MOTRIN) 600 MG tablet Take 1 tablet (600  mg total) by mouth every 6 (six) hours as needed. 01/09/17   Law, Waylan Boga, PA-C  methocarbamol (ROBAXIN) 500 MG tablet Take 1 tablet (500 mg total) by mouth 2 (two) times daily. 01/09/17   Law, Waylan Boga, PA-C  metroNIDAZOLE (FLAGYL) 500 MG tablet Take 1 tablet (500 mg total) by mouth 2 (two) times daily. 10/03/15   Marny Lowenstein, PA-C  nystatin cream (MYCOSTATIN) Apply to affected area 2 times daily 10/03/15   Marny Lowenstein, PA-C  ondansetron (ZOFRAN ODT) 8 MG disintegrating tablet Take 1 tablet (8 mg total) by mouth every 8 (eight) hours as needed for nausea or vomiting. 12/27/18   Joy, Shawn C, PA-C  ondansetron (ZOFRAN) 4 MG tablet Take 1 tablet (4 mg total) by mouth every 8 (eight) hours as needed for nausea or vomiting. 12/29/18   Couture, Cortni S, PA-C  oxyCODONE-acetaminophen (PERCOCET/ROXICET) 5-325 MG per tablet Take 1-2 tablets by mouth every 6 (six) hours as needed. 03/16/15   Anyanwu, Jethro Bastos, MD   sertraline (ZOLOFT) 50 MG tablet Take 1 tablet (50 mg total) by mouth daily. Patient not taking: Reported on 03/02/2015 03/01/15   Tereso Newcomer, MD  triamcinolone cream (KENALOG) 0.1 % Apply 1 application topically 2 (two) times daily. 10/03/15   Marny Lowenstein, PA-C    Family History Family History  Problem Relation Age of Onset  . Asthma Sister   . Heart disease Maternal Grandmother   . Other Neg Hx     Social History Social History   Tobacco Use  . Smoking status: Never Smoker  . Smokeless tobacco: Never Used  Substance Use Topics  . Alcohol use: No  . Drug use: No     Allergies   Patient has no known allergies.   Review of Systems Review of Systems ROS: Statement: All systems negative except as marked or noted in the HPI; Constitutional: Negative for fever and chills. ; ; Eyes: Negative for eye pain, redness and discharge. ; ; ENMT: Negative for ear pain, hoarseness, nasal congestion, sinus pressure and sore throat. ; ; Cardiovascular: Negative for chest pain, palpitations, diaphoresis, dyspnea and peripheral edema. ; ; Respiratory: Negative for cough, wheezing and stridor. ; ; Gastrointestinal: +N/V, abd pain. Negative for diarrhea, blood in stool, hematemesis, jaundice and rectal bleeding. . ; ; Genitourinary: Negative for dysuria, flank pain and hematuria. ; ; Musculoskeletal: Negative for back pain and neck pain. Negative for swelling and trauma.; ; Skin: Negative for pruritus, rash, abrasions, blisters, bruising and skin lesion.; ; Neuro: Negative for headache, lightheadedness and neck stiffness. Negative for weakness, altered level of consciousness, altered mental status, extremity weakness, paresthesias, involuntary movement, seizure and syncope.       Physical Exam Updated Vital Signs BP 128/67   Pulse 86   Temp 98 F (36.7 C) (Oral)   Resp 16   Ht  (1.626 m)   Wt 72.6 kg   LMP 12/14/2018   SpO2 100%   BMI 27.46 kg/m    Patient Vitals for the past  24 hrs:  BP Temp Temp src Pulse Resp SpO2 Height Weight  12/31/18 1551 - - - 60 17 100 % - -  12/31/18 1330 137/75 - - 88 20 100 % - -  12/31/18 1300 125/88 - - - - 100 % - -  12/31/18 1230 128/67 98 F (36.7 C) Oral 86 16 100 % - -  12/31/18 1228 - - - - - -  (1.626 m) 72.6  kg     Physical Exam 1235: Physical examination:  Nursing notes reviewed; Vital signs and O2 SAT reviewed;  Constitutional: Well developed, Well nourished, Well hydrated, In no acute distress; Head:  Normocephalic, atraumatic; Eyes: EOMI, PERRL, No scleral icterus; ENMT: Mouth and pharynx normal, Mucous membranes moist; Neck: Supple, Full range of motion, No lymphadenopathy; Cardiovascular: Regular rate and rhythm, No gallop; Respiratory: Breath sounds clear & equal bilaterally, No wheezes.  Speaking full sentences with ease, Normal respiratory effort/excursion; Chest: Nontender, Movement normal; Abdomen: Soft, +mid-epigastric > generalized tenderness to palp. No rebound or guarding. Nondistended, Normal bowel sounds; Genitourinary: No CVA tenderness; Extremities: Peripheral pulses normal, No tenderness, No edema, No calf edema or asymmetry.; Neuro: AA&Ox3, Major CN grossly intact.  Speech clear. No gross focal motor or sensory deficits in extremities.; Skin: Color normal, Warm, Dry.   ED Treatments / Results  Labs (all labs ordered are listed, but only abnormal results are displayed)   EKG None  Radiology   Procedures Procedures (including critical care time)  Medications Ordered in ED Medications  sodium chloride 0.9 % bolus 1,000 mL (has no administration in time range)  famotidine (PEPCID) IVPB 20 mg premix (has no administration in time range)  promethazine (PHENERGAN) injection 12.5 mg (has no administration in time range)     Initial Impression / Assessment and Plan / ED Course  I have reviewed the triage vital signs and the nursing notes.  Pertinent labs & imaging results that were available  during my care of the patient were reviewed by me and considered in my medical decision making (see chart for details).     MDM Reviewed: previous chart, nursing note and vitals Reviewed previous: labs Interpretation: labs, x-ray and CT scan     Results for orders placed or performed during the hospital encounter of 12/31/18  Comprehensive metabolic panel  Result Value Ref Range   Sodium 137 135 - 145 mmol/L   Potassium 2.7 (LL) 3.5 - 5.1 mmol/L   Chloride 100 98 - 111 mmol/L   CO2 22 22 - 32 mmol/L   Glucose, Bld 104 (H) 70 - 99 mg/dL   BUN 13 6 - 20 mg/dL   Creatinine, Ser 1.61 0.44 - 1.00 mg/dL   Calcium 9.2 8.9 - 09.6 mg/dL   Total Protein 8.5 (H) 6.5 - 8.1 g/dL   Albumin 4.5 3.5 - 5.0 g/dL   AST 24 15 - 41 U/L   ALT 26 0 - 44 U/L   Alkaline Phosphatase 59 38 - 126 U/L   Total Bilirubin 1.0 0.3 - 1.2 mg/dL   GFR calc non Af Amer >60 >60 mL/min   GFR calc Af Amer >60 >60 mL/min   Anion gap 15 5 - 15  Lipase, blood  Result Value Ref Range   Lipase 34 11 - 51 U/L  CBC with Differential  Result Value Ref Range   WBC 8.7 4.0 - 10.5 K/uL   RBC 4.17 3.87 - 5.11 MIL/uL   Hemoglobin 9.7 (L) 12.0 - 15.0 g/dL   HCT 04.5 (L) 40.9 - 81.1 %   MCV 77.9 (L) 80.0 - 100.0 fL   MCH 23.3 (L) 26.0 - 34.0 pg   MCHC 29.8 (L) 30.0 - 36.0 g/dL   RDW 91.4 (H) 78.2 - 95.6 %   Platelets 259 150 - 400 K/uL   nRBC 0.0 0.0 - 0.2 %   Neutrophils Relative % 84 %   Neutro Abs 7.3 1.7 - 7.7 K/uL   Lymphocytes  Relative 10 %   Lymphs Abs 0.9 0.7 - 4.0 K/uL   Monocytes Relative 6 %   Monocytes Absolute 0.5 0.1 - 1.0 K/uL   Eosinophils Relative 0 %   Eosinophils Absolute 0.0 0.0 - 0.5 K/uL   Basophils Relative 0 %   Basophils Absolute 0.0 0.0 - 0.1 K/uL   Immature Granulocytes 0 %   Abs Immature Granulocytes 0.02 0.00 - 0.07 K/uL  Pregnancy, urine  Result Value Ref Range   Preg Test, Ur NEGATIVE NEGATIVE  Urinalysis, Routine w reflex microscopic  Result Value Ref Range   Color, Urine  AMBER (A) YELLOW   APPearance CLOUDY (A) CLEAR   Specific Gravity, Urine 1.029 1.005 - 1.030   pH 7.0 5.0 - 8.0   Glucose, UA NEGATIVE NEGATIVE mg/dL   Hgb urine dipstick NEGATIVE NEGATIVE   Bilirubin Urine NEGATIVE NEGATIVE   Ketones, ur 80 (A) NEGATIVE mg/dL   Protein, ur 092 (A) NEGATIVE mg/dL   Nitrite NEGATIVE NEGATIVE   Leukocytes,Ua MODERATE (A) NEGATIVE   RBC / HPF 21-50 0 - 5 RBC/hpf   WBC, UA 21-50 0 - 5 WBC/hpf   Bacteria, UA RARE (A) NONE SEEN   Squamous Epithelial / LPF 21-50 0 - 5   Mucus PRESENT   Urine rapid drug screen (hosp performed)  Result Value Ref Range   Opiates NONE DETECTED NONE DETECTED   Cocaine NONE DETECTED NONE DETECTED   Benzodiazepines NONE DETECTED NONE DETECTED   Amphetamines NONE DETECTED NONE DETECTED   Tetrahydrocannabinol POSITIVE (A) NONE DETECTED   Barbiturates NONE DETECTED NONE DETECTED  CBG monitoring, ED  Result Value Ref Range   Glucose-Capillary 87 70 - 99 mg/dL   Dg Chest 2 View Result Date: 12/31/2018 CLINICAL DATA:  Abdominal pain with nausea, vomiting and dizziness. EXAM: CHEST - 2 VIEW COMPARISON:  12/02/2013 FINDINGS: Heart size is normal. Mediastinal shadows are normal. The lungs are clear. No bronchial thickening. No infiltrate, mass, effusion or collapse. Pulmonary vascularity is normal. No bony abnormality. IMPRESSION: Normal chest Electronically Signed   By: Paulina Fusi M.D.   On: 12/31/2018 13:03   Ct Abdomen Pelvis W Contrast Result Date: 12/31/2018 CLINICAL DATA:  Generalized abdominal pain with nausea vomiting. EXAM: CT ABDOMEN AND PELVIS WITH CONTRAST TECHNIQUE: Multidetector CT imaging of the abdomen and pelvis was performed using the standard protocol following bolus administration of intravenous contrast. CONTRAST:  OMNIPAQUE IOHEXOL 300 MG/ML  SOLN COMPARISON:  None. FINDINGS: Lower chest: Unremarkable. Hepatobiliary: No suspicious focal abnormality within the liver parenchyma. There is no evidence for gallstones,  gallbladder wall thickening, or pericholecystic fluid. No intrahepatic or extrahepatic biliary dilation. Pancreas: No focal mass lesion. No dilatation of the main duct. No intraparenchymal cyst. No peripancreatic edema. Spleen: No splenomegaly. No focal mass lesion. Adrenals/Urinary Tract: No adrenal nodule or mass. 2 mm nonobstructing stone identified lower pole right kidney. 2 mm non abstracts that several tiny 2 mm stones are seen in the lower pole the left kidney. No evidence for hydroureter. The urinary bladder appears normal for the degree of distention. Stomach/Bowel: Stomach is unremarkable. No gastric wall thickening. No evidence of outlet obstruction. Duodenum is normally positioned as is the ligament of Treitz. No small bowel wall thickening. No small bowel dilatation. The terminal ileum is normal. The appendix is not visualized, but there is no edema or inflammation in the region of the cecum. No gross colonic mass. No colonic wall thickening. Vascular/Lymphatic: No abdominal aortic aneurysm. No abdominal aortic atherosclerotic calcification. There  is no gastrohepatic or hepatoduodenal ligament lymphadenopathy. No intraperitoneal or retroperitoneal lymphadenopathy. No pelvic sidewall lymphadenopathy. Reproductive: The uterus is unremarkable.  There is no adnexal mass. Other: Trace free fluid evident in the cul-de-sac. Musculoskeletal: No worrisome lytic or sclerotic osseous abnormality. IMPRESSION: 1. No acute findings in the abdomen or pelvis. 2. Tiny nonobstructing bilateral renal stones. Electronically Signed   By: Kennith CenterEric  Mansell M.D.   On: 12/31/2018 14:09     [name] [name] was evaluated in Emergency Department on 12/31/2018 for the symptoms described in the history of present illness. She was evaluated in the context of the global COVID-19 pandemic, which necessitated consideration that the patient might be at risk for infection with the SARS-CoV-2 virus that causes COVID-19. Institutional  protocols and algorithms that pertain to the evaluation of patients at risk for COVID-19 are in a state of rapid change based on information released by regulatory bodies including the CDC and federal and state organizations. These policies and algorithms were followed during the patient's care in the ED.    1610:  H/H per baseline. Potassium repleted PO. Pt has tol PO well while in the ED without N/V.  No stooling while in the ED.  Abd benign, resps easy, VSS. Feels better and wants to go home now. Dx and testing, as well as incidental finding(s), d/w pt.  Questions answered.  Verb understanding, agreeable to d/c home with outpt f/u.     Final Clinical Impressions(s) / ED Diagnoses   Final diagnoses:  None    ED Discharge Orders    None       Samuel JesterMcManus, Cayson Kalb, DO 01/02/19 1850

## 2018-12-31 NOTE — ED Triage Notes (Addendum)
Patient c/o generalized abdominal pain with N/V x5 days. Reoprts last BM today. Denies cough, congestion, fever and SOB. Seen at [facility] x2 for same.

## 2018-12-31 NOTE — ED Notes (Signed)
CRITICAL VALUE STICKER  CRITICAL VALUE: K- 2.7  RECEIVER (on-site recipient of call): Quitman Livings, RN  DATE & TIME NOTIFIED: 12/31/2018 1457  MESSENGER (representative from lab): Rosezena Sensor  MD NOTIFIED: Clarene Duke  TIME OF NOTIFICATION: 1458  RESPONSE:

## 2018-12-31 NOTE — ED Notes (Signed)
Pt tolerating po intake, no c/o n/v.

## 2018-12-31 NOTE — ED Notes (Signed)
Pt transported to and returned from CT.  

## 2018-12-31 NOTE — ED Notes (Signed)
Pt provided apple juice and saltine crackers.

## 2019-03-21 ENCOUNTER — Other Ambulatory Visit: Payer: Self-pay

## 2019-03-21 ENCOUNTER — Encounter (HOSPITAL_COMMUNITY): Payer: Self-pay | Admitting: Emergency Medicine

## 2019-03-21 ENCOUNTER — Emergency Department (HOSPITAL_COMMUNITY)
Admission: EM | Admit: 2019-03-21 | Discharge: 2019-03-21 | Disposition: A | Discharge status: Home / Self Care | Payer: Self-pay | Attending: Emergency Medicine | Admitting: Emergency Medicine

## 2019-03-21 DIAGNOSIS — R1013 Epigastric pain: Secondary | ICD-10-CM | POA: Insufficient documentation

## 2019-03-21 DIAGNOSIS — R112 Nausea with vomiting, unspecified: Secondary | ICD-10-CM

## 2019-03-21 LAB — COMPREHENSIVE METABOLIC PANEL
ALT: 31 U/L (ref 0–44)
AST: 24 U/L (ref 15–41)
Albumin: 4.4 g/dL (ref 3.5–5.0)
Alkaline Phosphatase: 62 U/L (ref 38–126)
Anion gap: 12 (ref 5–15)
BUN: 11 mg/dL (ref 6–20)
CO2: 24 mmol/L (ref 22–32)
Calcium: 9.7 mg/dL (ref 8.9–10.3)
Chloride: 102 mmol/L (ref 98–111)
Creatinine, Ser: 0.62 mg/dL (ref 0.44–1.00)
GFR calc Af Amer: >60 mL/min (ref >60)
GFR calc non Af Amer: >60 mL/min (ref >60)
Glucose, Bld: 93 mg/dL (ref 70–99)
Potassium: 3.4 mmol/L — ABNORMAL LOW (ref 3.5–5.1)
Sodium: 138 mmol/L (ref 135–145)
Total Bilirubin: 0.9 mg/dL (ref 0.3–1.2)
Total Protein: 8.2 g/dL — ABNORMAL HIGH (ref 6.5–8.1)

## 2019-03-21 LAB — CBC WITH DIFFERENTIAL/PLATELET
Abs Immature Granulocytes: 0.02 10*3/uL (ref 0.00–0.07)
Basophils Absolute: 0 10*3/uL (ref 0.0–0.1)
Basophils Relative: 1 %
Eosinophils Absolute: 0 10*3/uL (ref 0.0–0.5)
Eosinophils Relative: 0 %
HCT: 31.4 % — ABNORMAL LOW (ref 36.0–46.0)
Hemoglobin: 9.7 g/dL — ABNORMAL LOW (ref 12.0–15.0)
Immature Granulocytes: 0 %
Lymphocytes Relative: 16 %
Lymphs Abs: 1.4 10*3/uL (ref 0.7–4.0)
MCH: 24 pg — ABNORMAL LOW (ref 26.0–34.0)
MCHC: 30.9 g/dL (ref 30.0–36.0)
MCV: 77.7 fL — ABNORMAL LOW (ref 80.0–100.0)
Monocytes Absolute: 0.6 10*3/uL (ref 0.1–1.0)
Monocytes Relative: 7 %
Neutro Abs: 6.8 10*3/uL (ref 1.7–7.7)
Neutrophils Relative %: 76 %
Platelets: 316 10*3/uL (ref 150–400)
RBC: 4.04 MIL/uL (ref 3.87–5.11)
RDW: 17.8 % — ABNORMAL HIGH (ref 11.5–15.5)
WBC: 8.8 10*3/uL (ref 4.0–10.5)
nRBC: 0 % (ref 0.0–0.2)

## 2019-03-21 LAB — URINALYSIS, ROUTINE W REFLEX MICROSCOPIC
Bilirubin Urine: NEGATIVE
Glucose, UA: NEGATIVE mg/dL
Hgb urine dipstick: NEGATIVE
Ketones, ur: 80 mg/dL — AB
Nitrite: NEGATIVE
Protein, ur: 100 mg/dL — AB
Specific Gravity, Urine: 1.028 (ref 1.005–1.030)
pH: 6 (ref 5.0–8.0)

## 2019-03-21 LAB — LIPASE, BLOOD: Lipase: 30 U/L (ref 11–51)

## 2019-03-21 LAB — I-STAT BETA HCG BLOOD, ED (MC, WL, AP ONLY): I-stat hCG, quantitative: <5 m[IU]/mL (ref <5)

## 2019-03-21 MED ORDER — FAMOTIDINE 20 MG PO TABS: 20.0000 mg | ORAL_TABLET | Freq: Two times a day (BID) | ORAL | 0 refills | Status: DC

## 2019-03-21 MED ORDER — FAMOTIDINE IN NACL 20-0.9 MG/50ML-% IV SOLN
20.0000 mg | Freq: Once | INTRAVENOUS | Status: AC
  Administered 2019-03-21: 20 mg via INTRAVENOUS
  Filled 2019-03-21: qty 50

## 2019-03-21 MED ORDER — ALUM & MAG HYDROXIDE-SIMETH 200-200-20 MG/5ML PO SUSP
30.0000 mL | Freq: Once | ORAL | Status: AC
  Administered 2019-03-21: 30 mL via ORAL
  Filled 2019-03-21: qty 30

## 2019-03-21 MED ORDER — LIDOCAINE VISCOUS HCL 2 % MT SOLN
15.0000 mL | Freq: Once | OROMUCOSAL | Status: AC
  Administered 2019-03-21: 15 mL via ORAL
  Filled 2019-03-21: qty 15

## 2019-03-21 MED ORDER — DICYCLOMINE HCL 20 MG PO TABS: 20.0000 mg | ORAL_TABLET | Freq: Two times a day (BID) | ORAL | 0 refills | Status: DC

## 2019-03-21 MED ORDER — ONDANSETRON HCL 4 MG/2ML IJ SOLN
4.0000 mg | Freq: Once | INTRAMUSCULAR | Status: AC
  Administered 2019-03-21: 4 mg via INTRAVENOUS
  Filled 2019-03-21: qty 2

## 2019-03-21 MED ORDER — SODIUM CHLORIDE 0.9 % IV BOLUS
1000.0000 mL | Freq: Once | INTRAVENOUS | Status: AC
  Administered 2019-03-21: 1000 mL via INTRAVENOUS

## 2019-03-21 MED ORDER — ONDANSETRON 4 MG PO TBDP: ORAL_TABLET | ORAL | 0 refills | Status: DC

## 2019-03-21 MED ORDER — DICYCLOMINE HCL 10 MG/ML IM SOLN
20.0000 mg | Freq: Once | INTRAMUSCULAR | Status: AC
  Administered 2019-03-21: 20 mg via INTRAMUSCULAR
  Filled 2019-03-21: qty 2

## 2019-03-21 NOTE — ED Triage Notes (Signed)
Pt here for nausea and vomiting since Thursday. Pt states this is not the first time she has had these symptoms. Pt states her pain in the center of her abdomen.

## 2019-03-21 NOTE — ED Provider Notes (Signed)
Care assumed from [place] law, please see her note for full details, but in brief [name] [name] is a 28 y.o. female who presents with epigastric and left upper quadrant pain as well as nausea and vomiting for the past 4 days.  No blood in her vomit or stool.  Has had similar symptoms in the past.  Had prior history of marijuana use but stopped 3 to 4 weeks ago.  Plan is for labs and symptomatic treatment, abdominal exam reassuring doubt imaging will be indicated.  Plan: Follow-up on labs and reassess patient's pain.  Physical Exam  BP (!) 130/52 (BP Location: Right Arm)   Pulse (!) 53   Temp 98.2 F (36.8 C) (Oral)   Resp 16   Ht 5\' 4"  (1.626 m)   Wt 72.6 kg   SpO2 100%   BMI 27.46 kg/m   Physical Exam Vitals signs and nursing note reviewed.  Constitutional:      General: She is not in acute distress.    Appearance: Normal appearance. She is well-developed and normal weight. She is not ill-appearing or diaphoretic.  HENT:     Head: Normocephalic and atraumatic.  Eyes:     General:        Right eye: No discharge.        Left eye: No discharge.  Pulmonary:     Effort: Pulmonary effort is normal. No respiratory distress.  Abdominal:     General: Abdomen is flat. Bowel sounds are normal.     Tenderness: There is abdominal tenderness. There is no guarding or rebound.     Comments: Abdominal pain improved, slight epigastric and left upper quadrant tenderness but no guarding or peritoneal signs.  Skin:    General: Skin is warm and dry.  Neurological:     Mental Status: She is alert.     Coordination: Coordination normal.  Psychiatric:        Mood and Affect: Mood normal.        Behavior: Behavior normal.     ED Course/Procedures   Labs Reviewed  COMPREHENSIVE METABOLIC PANEL - Abnormal; Notable for the following components:      Result Value   Potassium 3.4 (*)    Total Protein 8.2 (*)    All other components within normal limits  CBC WITH DIFFERENTIAL/PLATELET -  Abnormal; Notable for the following components:   Hemoglobin 9.7 (*)    HCT 31.4 (*)    MCV 77.7 (*)    MCH 24.0 (*)    RDW 17.8 (*)    All other components within normal limits  URINALYSIS, ROUTINE W REFLEX MICROSCOPIC - Abnormal; Notable for the following components:   APPearance CLOUDY (*)    Ketones, ur 80 (*)    Protein, ur 100 (*)    Leukocytes,Ua LARGE (*)    Bacteria, UA RARE (*)    All other components within normal limits  LIPASE, BLOOD  I-STAT BETA HCG BLOOD, ED ([facility], [facility], [facility] ONLY)     Procedures  MDM   Labs overall reassuring, potassium of 3.4, hemoglobin is stable when compared to previous and patient is not having any bleeding, urinalysis does have large leukocytes but with rare bacteria and numerous squamous cells I feel this is more likely related to contamination and patient is not having focal urinary symptoms, sent for culture but will not treat with antibiotics.  Symptoms have improved with treatment here in the ED and patient is tolerating p.o. with no further episodes of vomiting.  Will be discharged home with symptomatic treatment and GI follow-up.  Patient expresses understanding and agreement with plan.  Discharged home in good condition.  Final diagnoses:  Epigastric pain  Non-intractable vomiting with nausea, unspecified vomiting type   ED Discharge Orders    None            Janet Berlin 03/21/19 1943    Quintella Reichert, MD 03/22/19 1031

## 2019-03-21 NOTE — ED Notes (Signed)
Patient verbalizes understanding of discharge instructions. Opportunity for questioning and answers were provided. Armband removed by staff, pt discharged from ED ambulatory to home.  

## 2019-03-21 NOTE — Discharge Instructions (Signed)
Your work-up today is reassuring.  Please treat your symptoms supportively with Zofran for nausea, Pepcid before breakfast and dinner to help with acid production and Bentyl as needed for pain.  Please schedule follow-up appointment with your primary care doctor.  Return for fevers, worsening pain, persistent vomiting, blood in your vomit or stool or any other new or concerning symptoms.

## 2019-03-21 NOTE — ED Provider Notes (Signed)
[facility] Provider Note   CSN: [id number] Arrival date & time: 03/21/19  1528    History   Chief Complaint No chief complaint on file.   HPI [name] [name] is a 28 y.o. female who presents with a 4-day history of nausea, vomiting and epigastric pain.  She describes it as constant and dull.  Patient reports she is vomiting anytime she tries to put anything in her mouth even water.  She reports she was able to keep down chicken broth one time.  Patient reports she felt like she had fevers and was sweating on the first day, however that has not recurred.  She denies any chest pain, shortness of breath, cough, diarrhea, bloody stools, urinary symptoms, back pain, abnormal vaginal discharge or bleeding.  Patient denies marijuana use and states she quit 3 to 4 weeks ago.  She denies NSAID use.  Patient denies any known sick contacts or eating different food prior to symptom onset.     HPI  Past Medical History:  Diagnosis Date  . Chlamydia    06/15/14 w/tx, 10/11/14 w/tx, TOC-neg x2  . Eczema   . Gonorrhea 06/15/14   w/tx, TOC- neg  . History of anemia   . Infection    chlamydia & gonorrhea  . Trichomonas     Patient Active Problem List   Diagnosis Date Noted  . Encounter for female sterilization procedure     Past Surgical History:  Procedure Laterality Date  . fractured finger as a child    . LAPAROSCOPIC TUBAL LIGATION Bilateral 03/16/2015   Procedure: LAPAROSCOPIC TUBAL LIGATION WITH FILSHIE CLIPS;  Surgeon: Tereso NewcomerUgonna A Anyanwu, MD;  Location: [facility];  Service: Gynecology;  Laterality: Bilateral;  With Filshie clips  Requested 03/16/15 @ 1:00p  . WISDOM TOOTH EXTRACTION       OB History    Gravida  5   Para  4   Term  4   Preterm      AB  1   Living  4     SAB  1   TAB      Ectopic      Multiple  0   Live Births  4            Home Medications    Prior to Admission medications   Medication Sig Start  Date End Date Taking? Authorizing Provider  cephALEXin (KEFLEX) 500 MG capsule Take 1 capsule (500 mg total) by mouth 2 (two) times daily. Patient not taking: Reported on 12/31/2018 01/09/17   Emi HolesLaw, Milka Windholz M, PA-C  dicyclomine (BENTYL) 20 MG tablet Take 1 tablet (20 mg total) by mouth 2 (two) times daily for 5 days. 12/29/18 01/03/19  Couture, Cortni S, PA-C  docusate sodium (COLACE) 100 MG capsule Take 1 capsule (100 mg total) by mouth 2 (two) times daily as needed. Patient not taking: Reported on 12/31/2018 03/16/15   Tereso NewcomerAnyanwu, Ugonna A, MD  famotidine (PEPCID) 20 MG tablet Take 1 tablet (20 mg total) by mouth 2 (two) times daily for 14 days. Patient not taking: Reported on 12/31/2018 12/29/18 01/12/19  Couture, Cortni S, PA-C  fluconazole (DIFLUCAN) 150 MG tablet Take 1 tab (150 mg) today and 1 tab (150 mg) in 7 days Patient not taking: Reported on 12/31/2018 10/03/15   Marny LowensteinWenzel, Julie N, PA-C  ibuprofen (ADVIL,MOTRIN) 600 MG tablet Take 1 tablet (600 mg total) by mouth every 6 (six) hours as needed. Patient not taking: Reported on 12/31/2018 01/09/17  Lynn Recendiz M, PA-C  methocarbamol (ROBAXIN) 500 MG tablet Take 1 tablet (500 mg total) by mouth 2 (two) times daily. Patient not taking: Reported on 12/31/2018 01/09/17   Emi HolesLaw, Jade Burright M, PA-C  metroNIDAZOLE (FLAGYL) 500 MG tablet Take 1 tablet (500 mg total) by mouth 2 (two) times daily. Patient not taking: Reported on 12/31/2018 10/03/15   Marny LowensteinWenzel, Julie N, PA-C  nystatin cream (MYCOSTATIN) Apply to affected area 2 times daily Patient not taking: Reported on 12/31/2018 10/03/15   Marny LowensteinWenzel, Julie N, PA-C  ondansetron (ZOFRAN ODT) 8 MG disintegrating tablet Take 1 tablet (8 mg total) by mouth every 8 (eight) hours as needed for nausea or vomiting. Patient not taking: Reported on 12/31/2018 12/27/18   Joy, Ines BloomerShawn C, PA-C  ondansetron (ZOFRAN) 4 MG tablet Take 1 tablet (4 mg total) by mouth every 8 (eight) hours as needed for nausea or vomiting. 12/29/18   Couture, Cortni S, PA-C   oxyCODONE-acetaminophen (PERCOCET/ROXICET) 5-325 MG per tablet Take 1-2 tablets by mouth every 6 (six) hours as needed. Patient not taking: Reported on 12/31/2018 03/16/15   Tereso NewcomerAnyanwu, Ugonna A, MD  promethazine (PHENERGAN) 25 MG suppository Place 1 suppository (25 mg total) rectally every 8 (eight) hours as needed for nausea or vomiting. 12/31/18   Samuel JesterMcManus, Kathleen, DO  promethazine (PHENERGAN) 25 MG tablet Take 1 tablet (25 mg total) by mouth every 6 (six) hours as needed for nausea or vomiting. 12/31/18   Samuel JesterMcManus, Kathleen, DO  sertraline (ZOLOFT) 50 MG tablet Take 1 tablet (50 mg total) by mouth daily. 03/01/15   Anyanwu, Jethro BastosUgonna A, MD  triamcinolone cream (KENALOG) 0.1 % Apply 1 application topically 2 (two) times daily. Patient not taking: Reported on 12/31/2018 10/03/15   Marny LowensteinWenzel, Julie N, PA-C    Family History Family History  Problem Relation Age of Onset  . Asthma Sister   . Heart disease Maternal Grandmother   . Other Neg Hx     Social History Social History   Tobacco Use  . Smoking status: Never Smoker  . Smokeless tobacco: Never Used  Substance Use Topics  . Alcohol use: No  . Drug use: No     Allergies   Patient has no known allergies.   Review of Systems Review of Systems  Constitutional: Positive for chills and diaphoresis (a couple days ago; resolved). Negative for fever.  HENT: Negative for facial swelling and sore throat.   Respiratory: Negative for shortness of breath.   Cardiovascular: Negative for chest pain.  Gastrointestinal: Positive for abdominal pain, nausea and vomiting.  Genitourinary: Negative for dysuria.  Musculoskeletal: Negative for back pain.  Skin: Negative for rash and wound.  Neurological: Negative for headaches.  Psychiatric/Behavioral: The patient is not nervous/anxious.      Physical Exam Updated Vital Signs BP (!) 130/52 (BP Location: Right Arm)   Pulse (!) 53   Temp 98.2 F (36.8 C) (Oral)   Resp 16   Ht 5\' 4"  (1.626 m)   Wt 72.6 kg    SpO2 100%   BMI 27.46 kg/m   Physical Exam Vitals signs and nursing note reviewed.  Constitutional:      General: She is not in acute distress.    Appearance: She is well-developed. She is not diaphoretic.  HENT:     Head: Normocephalic and atraumatic.     Mouth/Throat:     Pharynx: No oropharyngeal exudate.  Eyes:     General: No scleral icterus.       Right eye: No discharge.  Left eye: No discharge.     Conjunctiva/sclera: Conjunctivae normal.     Pupils: Pupils are equal, round, and reactive to light.  Neck:     Musculoskeletal: Normal range of motion and neck supple.     Thyroid: No thyromegaly.  Cardiovascular:     Rate and Rhythm: Normal rate and regular rhythm.     Heart sounds: Normal heart sounds. No murmur. No friction rub. No gallop.   Pulmonary:     Effort: Pulmonary effort is normal. No respiratory distress.     Breath sounds: Normal breath sounds. No stridor. No wheezing or rales.  Abdominal:     General: Bowel sounds are normal. There is no distension.     Palpations: Abdomen is soft.     Tenderness: There is abdominal tenderness in the epigastric area and left upper quadrant. There is no right CVA tenderness, left CVA tenderness, guarding or rebound. Negative signs include Murphy's sign and McBurney's sign.  Lymphadenopathy:     Cervical: No cervical adenopathy.  Skin:    General: Skin is warm and dry.     Coloration: Skin is not pale.     Findings: No rash.  Neurological:     Mental Status: She is alert.     Coordination: Coordination normal.      ED Treatments / Results  Labs (all labs ordered are listed, but only abnormal results are displayed) Labs Reviewed  COMPREHENSIVE METABOLIC PANEL  LIPASE, BLOOD  CBC WITH DIFFERENTIAL/PLATELET  URINALYSIS, ROUTINE W REFLEX MICROSCOPIC  I-STAT BETA HCG BLOOD, ED ([facility], [facility], [facility] ONLY)    EKG None  Radiology No results found.  Procedures Procedures (including critical care time)   Medications Ordered in ED Medications  sodium chloride 0.9 % bolus 1,000 mL (has no administration in time range)  ondansetron (ZOFRAN) injection 4 mg (has no administration in time range)  famotidine (PEPCID) IVPB 20 mg premix (has no administration in time range)  alum & mag hydroxide-simeth (MAALOX/MYLANTA) 200-200-20 MG/5ML suspension 30 mL (has no administration in time range)    And  lidocaine (XYLOCAINE) 2 % viscous mouth solution 15 mL (has no administration in time range)     Initial Impression / Assessment and Plan / ED Course  I have reviewed the triage vital signs and the nursing notes.  Pertinent labs & imaging results that were available during my care of the patient were reviewed by me and considered in my medical decision making (see chart for details).        Patient presenting with epigastric pain with nausea and vomiting.  Patient has history of the same and it was thought potentially cannabis hyperemesis in the past.  Patient reports she has not used marijuana in the past 3 to 4 weeks.  Patient symptoms are more consistent with ulcer.  Labs pending.  Will give IV fluids, Zofran, Pepcid, GI cocktail.  Patient has epigastric and left upper quadrant tenderness.  Will hold off on imaging at this time and reassess following labs and medications. At shift change, patient care transferred to [facility], PA-C for continued evaluation, follow up of labs, meds, fluids and determination of disposition.   Final Clinical Impressions(s) / ED Diagnoses   Final diagnoses:  None    ED Discharge Orders    None       Frederica Kuster, PA-C 03/21/19 1548    Quintella Reichert, MD 03/22/19 1031

## 2019-03-22 ENCOUNTER — Encounter: Payer: Self-pay | Admitting: Gastroenterology

## 2019-03-23 LAB — URINE CULTURE

## 2019-03-26 ENCOUNTER — Telehealth: Payer: Self-pay | Admitting: *Deleted

## 2019-03-26 ENCOUNTER — Encounter (HOSPITAL_COMMUNITY): Payer: Self-pay | Admitting: Emergency Medicine

## 2019-03-26 ENCOUNTER — Emergency Department (HOSPITAL_COMMUNITY)
Admission: EM | Admit: 2019-03-26 | Discharge: 2019-03-26 | Disposition: A | Discharge status: Home / Self Care | Payer: Self-pay | Attending: Emergency Medicine | Admitting: Emergency Medicine

## 2019-03-26 DIAGNOSIS — A5901 Trichomonal vulvovaginitis: Secondary | ICD-10-CM | POA: Insufficient documentation

## 2019-03-26 DIAGNOSIS — R1013 Epigastric pain: Secondary | ICD-10-CM | POA: Insufficient documentation

## 2019-03-26 DIAGNOSIS — E860 Dehydration: Secondary | ICD-10-CM | POA: Insufficient documentation

## 2019-03-26 DIAGNOSIS — F1290 Cannabis use, unspecified, uncomplicated: Secondary | ICD-10-CM | POA: Insufficient documentation

## 2019-03-26 DIAGNOSIS — R112 Nausea with vomiting, unspecified: Secondary | ICD-10-CM | POA: Insufficient documentation

## 2019-03-26 DIAGNOSIS — E876 Hypokalemia: Secondary | ICD-10-CM | POA: Insufficient documentation

## 2019-03-26 LAB — RAPID URINE DRUG SCREEN, HOSP PERFORMED
Amphetamines: NOT DETECTED
Barbiturates: NOT DETECTED
Benzodiazepines: NOT DETECTED
Cocaine: NOT DETECTED
Opiates: NOT DETECTED
Tetrahydrocannabinol: POSITIVE — AB

## 2019-03-26 LAB — COMPREHENSIVE METABOLIC PANEL
ALT: 21 U/L (ref 0–44)
AST: 17 U/L (ref 15–41)
Albumin: 4.4 g/dL (ref 3.5–5.0)
Alkaline Phosphatase: 60 U/L (ref 38–126)
Anion gap: 15 (ref 5–15)
BUN: 11 mg/dL (ref 6–20)
CO2: 23 mmol/L (ref 22–32)
Calcium: 9.8 mg/dL (ref 8.9–10.3)
Chloride: 100 mmol/L (ref 98–111)
Creatinine, Ser: 0.77 mg/dL (ref 0.44–1.00)
GFR calc Af Amer: >60 mL/min (ref >60)
GFR calc non Af Amer: >60 mL/min (ref >60)
Glucose, Bld: 87 mg/dL (ref 70–99)
Potassium: 3.3 mmol/L — ABNORMAL LOW (ref 3.5–5.1)
Sodium: 138 mmol/L (ref 135–145)
Total Bilirubin: 1.4 mg/dL — ABNORMAL HIGH (ref 0.3–1.2)
Total Protein: 8.1 g/dL (ref 6.5–8.1)

## 2019-03-26 LAB — CBC
HCT: 35.6 % — ABNORMAL LOW (ref 36.0–46.0)
Hemoglobin: 11.1 g/dL — ABNORMAL LOW (ref 12.0–15.0)
MCH: 24 pg — ABNORMAL LOW (ref 26.0–34.0)
MCHC: 31.2 g/dL (ref 30.0–36.0)
MCV: 76.9 fL — ABNORMAL LOW (ref 80.0–100.0)
Platelets: 294 10*3/uL (ref 150–400)
RBC: 4.63 MIL/uL (ref 3.87–5.11)
RDW: 16.9 % — ABNORMAL HIGH (ref 11.5–15.5)
WBC: 7.7 10*3/uL (ref 4.0–10.5)
nRBC: 0 % (ref 0.0–0.2)

## 2019-03-26 LAB — URINALYSIS, ROUTINE W REFLEX MICROSCOPIC
Bilirubin Urine: NEGATIVE
Glucose, UA: NEGATIVE mg/dL
Hgb urine dipstick: NEGATIVE
Ketones, ur: 80 mg/dL — AB
Nitrite: NEGATIVE
Protein, ur: 100 mg/dL — AB
Specific Gravity, Urine: 1.033 — ABNORMAL HIGH (ref 1.005–1.030)
pH: 5 (ref 5.0–8.0)

## 2019-03-26 LAB — WET PREP, GENITAL
Clue Cells Wet Prep HPF POC: NONE SEEN
Sperm: NONE SEEN
Yeast Wet Prep HPF POC: NONE SEEN

## 2019-03-26 LAB — LIPASE, BLOOD: Lipase: 25 U/L (ref 11–51)

## 2019-03-26 LAB — I-STAT BETA HCG BLOOD, ED (MC, WL, AP ONLY): I-stat hCG, quantitative: <5 m[IU]/mL (ref <5)

## 2019-03-26 MED ORDER — METRONIDAZOLE 500 MG PO TABS
2000.0000 mg | ORAL_TABLET | Freq: Once | ORAL | Status: AC
  Administered 2019-03-26: 2000 mg via ORAL
  Filled 2019-03-26: qty 4

## 2019-03-26 MED ORDER — PROMETHAZINE HCL 25 MG/ML IJ SOLN
12.5000 mg | Freq: Once | INTRAMUSCULAR | Status: AC
  Administered 2019-03-26: 12.5 mg via INTRAVENOUS
  Filled 2019-03-26: qty 1

## 2019-03-26 MED ORDER — SODIUM CHLORIDE 0.9 % IV BOLUS
1000.0000 mL | Freq: Once | INTRAVENOUS | Status: AC
  Administered 2019-03-26: 1000 mL via INTRAVENOUS

## 2019-03-26 MED ORDER — SODIUM CHLORIDE 0.9 % IV BOLUS
500.0000 mL | Freq: Once | INTRAVENOUS | Status: AC
  Administered 2019-03-26: 500 mL via INTRAVENOUS

## 2019-03-26 MED ORDER — POTASSIUM CHLORIDE CRYS ER 20 MEQ PO TBCR: 20.0000 meq | EXTENDED_RELEASE_TABLET | Freq: Two times a day (BID) | ORAL | 0 refills | Status: DC

## 2019-03-26 MED ORDER — SODIUM CHLORIDE 0.9% FLUSH
3.0000 mL | Freq: Once | INTRAVENOUS | Status: AC
  Administered 2019-03-26: 3 mL via INTRAVENOUS

## 2019-03-26 MED ORDER — ONDANSETRON 4 MG PO TBDP: 4.0000 mg | ORAL_TABLET | Freq: Three times a day (TID) | ORAL | 0 refills | Status: DC | PRN

## 2019-03-26 MED ORDER — OMEPRAZOLE 20 MG PO CPDR: 20.0000 mg | DELAYED_RELEASE_CAPSULE | Freq: Every day | ORAL | 0 refills | Status: DC

## 2019-03-26 MED ORDER — METRONIDAZOLE 500 MG PO TABS: 500.0000 mg | ORAL_TABLET | Freq: Two times a day (BID) | ORAL | 0 refills | Status: DC

## 2019-03-26 MED ORDER — AZITHROMYCIN 500 MG PO TABS: 1000.0000 mg | ORAL_TABLET | Freq: Once | ORAL | 0 refills | Status: AC

## 2019-03-26 MED ORDER — PROMETHAZINE HCL 25 MG RE SUPP: 25.0000 mg | Freq: Four times a day (QID) | RECTAL | 0 refills | Status: DC | PRN

## 2019-03-26 MED ORDER — SUCRALFATE 1 GM/10ML PO SUSP: 1.0000 g | Freq: Three times a day (TID) | ORAL | 0 refills | Status: DC

## 2019-03-26 MED ORDER — FAMOTIDINE IN NACL 20-0.9 MG/50ML-% IV SOLN
20.0000 mg | Freq: Once | INTRAVENOUS | Status: AC
  Administered 2019-03-26: 20 mg via INTRAVENOUS
  Filled 2019-03-26: qty 50

## 2019-03-26 MED ORDER — CEFTRIAXONE SODIUM 250 MG IJ SOLR
250.0000 mg | Freq: Once | INTRAMUSCULAR | Status: AC
  Administered 2019-03-26: 250 mg via INTRAMUSCULAR
  Filled 2019-03-26: qty 250

## 2019-03-26 MED ORDER — DICYCLOMINE HCL 10 MG/ML IM SOLN
20.0000 mg | Freq: Once | INTRAMUSCULAR | Status: AC
  Administered 2019-03-26: 11:00:00 20 mg via INTRAMUSCULAR
  Filled 2019-03-26: qty 2

## 2019-03-26 MED ORDER — ONDANSETRON HCL 4 MG/2ML IJ SOLN
4.0000 mg | Freq: Once | INTRAMUSCULAR | Status: AC
  Administered 2019-03-26: 4 mg via INTRAVENOUS
  Filled 2019-03-26: qty 2

## 2019-03-26 NOTE — ED Triage Notes (Signed)
Pt is seeking care for abd pain that is in the center of her abdomen around her umbilicus that began last Thursday and was seen in this ER on 7/26 pt states she had blood work and was given zofran- pt states she continues to have this pain with emesis. Pt currently has no pain but just took motrin PTA.

## 2019-03-26 NOTE — Discharge Instructions (Signed)
You were seen in the emergency department today for abdominal pain with nausea and vomiting.  Your labs show that your potassium was somewhat low at 3.3, normal is 3.5-5.1.  We are providing information for diet recommendations and potassium supplements to take to help with this.  Your urine showed that you are very dehydrated with ketones, blood, and also was noted to have some leukocytes (a type of white blood cell)-you are given steroids in the emergency department.  Would like you to have your urine rechecked by primary care within 1 week.    Your wet prep exam (sample taken during your pelvic exam) showed that you have trichomoniasis.  This is a sexually transmitted infection.  We are sending you home with a prescription for Flagyl to take twice per day for 1 week.  Please take this once your nausea and vomiting has improved.  You will need to inform all sexual partners.  Given that this is sexually transmitted it is important that you inform all sexual partners.  Abstain from any type of sex until 10 days after you have completed the antibiotics. Do not consume alcohol when taking Flagyl as it extremely dangerous.  We also tested you for gonorrhea, chlamydia, HIV, and syphilis.  We will call you if these results are positive.  If positive you will need to inform all sexual partners of these results as well.  You were treated for gonorrhea in the ER with a shot, you are given a prescription for azithromycin, tablets to take once you are feeling better that would cover you for chlamydia.  Your range meaning blood work did not show significant abnormalities.  We are sending you home with the following prescriptions in addition to the ones listed above: -Omeprazole: This is a proton pump inhibitor to help with acid buildup and discomfort in your stomach, take this daily -

## 2019-03-26 NOTE — ED Notes (Signed)
Pt had an episode of vomiting after giving her ginger ale. PA notified

## 2019-03-26 NOTE — ED Notes (Signed)
Pelvic cart at bedside. 

## 2019-03-26 NOTE — Telephone Encounter (Signed)
Pharmacy called related to Rx: sucralfate being too expensive .Marland KitchenMarland KitchenEDCM clarified with EDP (Petrucelli) to change Rx to: tablet form.

## 2019-03-26 NOTE — ED Provider Notes (Signed)
[facility] Provider [facility]   CSN: [id number] Arrival date & time: 03/26/19  [id number]     History   Chief Complaint Chief Complaint  Patient presents with  . Abdominal Pain  . Emesis    HPI [name] [name] is a 28 y.o. female with a hx of anemia, prior STIs, & prior tubal ligation who returns to the ER with complaints of N/V w/ abdominal pain x 8 days. Patient reports that she has had nausea with approximately 4 episodes of emesis per day. She states anytime she tried to eat or drink anything she cannot keep it down. She has associated epigastric abdominal pain which is a constant ache, worse when she eats, brief relief w/ vomiting. She was seen in the ED for same 3 days ago- given prescriptions for zofran, pepcid, & bentyl- has been trying to take these as prescribed, but has been vomiting the medicines. She states she also was seen in May 2020 for what she states were the exact same symptoms, no major difference, they did gradually improve but now have returned. She has an appointment w/ [facility] 08/19. Denies fever, chills, hematemesis, melena, hematochezia, chest pain, dyspnea, or dysuria. She states she has had a mild vaginal discharge which has been white the past couple days, she is not sexually active, she is not concerned for STDs.      HPI  Past Medical History:  Diagnosis Date  . Chlamydia    06/15/14 w/tx, 10/11/14 w/tx, TOC-neg x2  . Eczema   . Gonorrhea 06/15/14   w/tx, TOC- neg  . History of anemia   . Infection    chlamydia & gonorrhea  . Trichomonas     Patient Active Problem List   Diagnosis Date Noted  . Encounter for female sterilization procedure     Past Surgical History:  Procedure Laterality Date  . fractured finger as a child    . LAPAROSCOPIC TUBAL LIGATION Bilateral 03/16/2015   Procedure: LAPAROSCOPIC TUBAL LIGATION WITH FILSHIE CLIPS;  Surgeon: Tereso NewcomerUgonna A Anyanwu, MD;  Location: [facility];  Service: Gynecology;   Laterality: Bilateral;  With Filshie clips  Requested 03/16/15 @ 1:00p  . WISDOM TOOTH EXTRACTION       OB History    Gravida  5   Para  4   Term  4   Preterm      AB  1   Living  4     SAB  1   TAB      Ectopic      Multiple  0   Live Births  4            Home Medications    Prior to Admission medications   Medication Sig Start Date End Date Taking? Authorizing Provider  dicyclomine (BENTYL) 20 MG tablet Take 1 tablet (20 mg total) by mouth 2 (two) times daily. 03/21/19   Dartha LodgeFord, Kelsey N, PA-C  famotidine (PEPCID) 20 MG tablet Take 1 tablet (20 mg total) by mouth 2 (two) times daily. 03/21/19   Dartha LodgeFord, Kelsey N, PA-C  ondansetron (ZOFRAN ODT) 4 MG disintegrating tablet 4mg  ODT q4 hours prn nausea/vomit 03/21/19   Dartha LodgeFord, Kelsey N, PA-C    Family History Family History  Problem Relation Age of Onset  . Asthma Sister   . Heart disease Maternal Grandmother   . Other Neg Hx     Social History Social History   Tobacco Use  . Smoking status: Never Smoker  .  Smokeless tobacco: Never Used  Substance Use Topics  . Alcohol use: No  . Drug use: No     Allergies   Patient has no known allergies.   Review of Systems Review of Systems  Constitutional: Negative for chills and fever.  Respiratory: Negative for shortness of breath.   Cardiovascular: Negative for chest pain.  Gastrointestinal: Positive for abdominal pain, nausea and vomiting. Negative for anal bleeding, blood in stool, constipation and diarrhea.  Genitourinary: Positive for vaginal discharge. Negative for dysuria and vaginal bleeding.  Neurological: Negative for weakness and numbness.  All other systems reviewed and are negative.    Physical Exam Updated Vital Signs BP 126/66 (BP Location: Right Arm)   Pulse 99   Temp (!) 97.5 F (36.4 C) (Oral)   Resp 16   SpO2 99%   Physical Exam Vitals signs and nursing note reviewed. Exam conducted with a chaperone present.  Constitutional:       General: She is not in acute distress.    Appearance: She is well-developed. She is not toxic-appearing.  HENT:     Head: Normocephalic and atraumatic.     Mouth/Throat:     Comments: Dry mucous membranes.  Eyes:     General:        Right eye: No discharge.        Left eye: No discharge.     Conjunctiva/sclera: Conjunctivae normal.  Neck:     Musculoskeletal: Neck supple.  Cardiovascular:     Rate and Rhythm: Normal rate and regular rhythm.  Pulmonary:     Effort: Pulmonary effort is normal. No respiratory distress.     Breath sounds: Normal breath sounds. No wheezing, rhonchi or rales.  Abdominal:     General: There is no distension.     Palpations: Abdomen is soft.     Tenderness: There is abdominal tenderness in the epigastric area. There is no right CVA tenderness, left CVA tenderness, guarding or rebound. Negative signs include Murphy's sign and McBurney's sign.  Genitourinary:    Exam position: Supine.     Labia:        Right: No lesion.        Left: No lesion.      Vagina: Vaginal discharge (mild white to yellow) present. No bleeding.     Cervix: No cervical motion tenderness or friability.     Adnexa:        Right: No mass, tenderness or fullness.         Left: No mass, tenderness or fullness.       Comments: EDT present as chaperone.  Skin:    General: Skin is warm and dry.     Findings: No rash.  Neurological:     Mental Status: She is alert.     Comments: Clear speech.   Psychiatric:        Behavior: Behavior normal.    ED Treatments / Results  Labs (all labs ordered are listed, but only abnormal results are displayed) Labs Reviewed  WET PREP, GENITAL - Abnormal; Notable for the following components:      Result Value   Trich, Wet Prep PRESENT (*)    WBC, Wet Prep HPF POC MANY (*)    All other components within normal limits  COMPREHENSIVE METABOLIC PANEL - Abnormal; Notable for the following components:   Potassium 3.3 (*)    Total Bilirubin 1.4 (*)     All other components within normal limits  CBC - Abnormal; Notable for  the following components:   Hemoglobin 11.1 (*)    HCT 35.6 (*)    MCV 76.9 (*)    MCH 24.0 (*)    RDW 16.9 (*)    All other components within normal limits  URINALYSIS, ROUTINE W REFLEX MICROSCOPIC - Abnormal; Notable for the following components:   Color, Urine AMBER (*)    APPearance CLOUDY (*)    Specific Gravity, Urine 1.033 (*)    Ketones, ur 80 (*)    Protein, ur 100 (*)    Leukocytes,Ua LARGE (*)    Bacteria, UA MANY (*)    Trichomonas, UA PRESENT (*)    All other components within normal limits  RAPID URINE DRUG SCREEN, HOSP PERFORMED - Abnormal; Notable for the following components:   Tetrahydrocannabinol POSITIVE (*)    All other components within normal limits  URINE CULTURE  LIPASE, BLOOD  RPR  HIV ANTIBODY (ROUTINE TESTING W REFLEX)  I-STAT BETA HCG BLOOD, ED ([facility], [facility], [facility] ONLY)  GC/CHLAMYDIA PROBE AMP ([place]) NOT AT [facility]    EKG None  Radiology No results found.  Procedures Procedures (including critical care time)  Medications Ordered in ED Medications  sodium chloride flush (NS) 0.9 % injection 3 mL (has no administration in time range)     Initial Impression / Assessment and Plan / ED Course  I have reviewed the triage vital signs and the nursing notes.  Pertinent labs & imaging results that were available during my care of the patient were reviewed by me and considered in my medical decision making (see chart for details).   Patient presents to the ED w/ complaints of N/V & epigastric abdominal pain x 8 days. Nontoxic appearing, resting fairly comfortably, vitals without significant abnormality. Exam w/ epigastric tenderness, no peritoneal signs.  Labs per triage:  CBC: no leukocytosis- baseline anemia somewhat improved- likely degree of concentration.  CMP: Mild hypokalemia. No other electrolyte derangement. Renal function preserved. Lipase: WNL Preg test: Negative.   UA: Trich +. Large leuks with many bacteria, however no urinary sxs & contaminated some, somewhat similar to prior UAs, will culture and defer tx to culture report.    Pelvic exam performed- no CMT or adnexal tenderness- exam not consistent w/ PID. No RUQ tenderness do not suspect fitz hugh-curtis syndrome.Mellody Drown.  Wet prep confirms trichomonas- attempted to give PO flagyl for a 1 x dose in the ED however patient subsequently vomited the medicine back up. Given her GI sxs & exam not consistent w/ PID will hold off on her PO abx for tx of trich. IM Ceftriaxone given for possible gonorrhea coverage. GC/chlamydia/RPR/HIV pending. Will give prescriptions for flagyl for trich & azithromycin for possibly chlamydia to take once her currently N/V is resolved in outpatient setting. She is aware to inform all sexual partners & is also aware to practice abstinence until 10 days following abx.   No peritoneal signs on repeat abdominal exam. Negative murphys & mcburneys. She has had prior CT for same sxs that was without acute findings. Doubt obstruction/perf, appendicitis, cholecystitis, or pancreatitis. Shortly after emesis w/ flagyl she has been able to tolerate PO fluids without difficulty. She feels much better. Will discharge home with a few more tablets of zofran as well as phenergan, PPI, carafate & continuation of her bentyl from prior visits. Will provide information for obgyn as well as alternative GI in the area for closer follow up. I discussed results, treatment plan, need for follow-up, and return precautions with the patient. Provided  opportunity for questions, patient confirmed understanding and is in agreement with plan.   Findings and plan of care discussed with supervising physician Dr. Tomi Bamberger who is in agreement.    Final Clinical Impressions(s) / ED Diagnoses   Final diagnoses:  Epigastric abdominal pain  Non-intractable vomiting with nausea, unspecified vomiting type  Hypokalemia  Trichomonal  vaginitis  Dehydration    ED Discharge Orders         Ordered    metroNIDAZOLE (FLAGYL) 500 MG tablet  2 times daily     03/26/19 1415    azithromycin (ZITHROMAX) 500 MG tablet   Once     03/26/19 1415    promethazine (PHENERGAN) 25 MG suppository  Every 6 hours PRN     03/26/19 1415    ondansetron (ZOFRAN ODT) 4 MG disintegrating tablet  Every 8 hours PRN     03/26/19 1415    omeprazole (PRILOSEC) 20 MG capsule  Daily     03/26/19 1415    potassium chloride SA (K-DUR) 20 MEQ tablet  2 times daily     03/26/19 1415    sucralfate (CARAFATE) 1 GM/10ML suspension  3 times daily with meals & bedtime     03/26/19 1415           Francis Yardley, [place] R, PA-C 03/26/19 1418    Dorie Rank, MD 03/27/19 [phone]

## 2019-03-26 NOTE — ED Notes (Signed)
Pt given ginger ale to drink. 

## 2019-03-26 NOTE — ED Notes (Signed)
Patient verbalizes understanding of discharge instructions. Opportunity for questioning and answers were provided. Armband removed by staff, pt discharged from ED ambulatory to home.  

## 2019-03-27 LAB — GC/CHLAMYDIA PROBE AMP (~~LOC~~) NOT AT ARMC
Chlamydia: POSITIVE — AB
Neisseria Gonorrhea: NEGATIVE

## 2019-03-27 LAB — URINE CULTURE

## 2019-03-27 LAB — HIV ANTIBODY (ROUTINE TESTING W REFLEX): HIV Screen 4th Generation wRfx: NONREACTIVE

## 2019-03-27 LAB — RPR: RPR Ser Ql: NONREACTIVE

## 2019-04-14 ENCOUNTER — Ambulatory Visit: Payer: Self-pay | Admitting: Gastroenterology

## 2019-06-14 ENCOUNTER — Emergency Department (HOSPITAL_COMMUNITY)
Admission: EM | Admit: 2019-06-14 | Discharge: 2019-06-14 | Disposition: A | Discharge status: Home / Self Care | Payer: Self-pay | Attending: Emergency Medicine | Admitting: Emergency Medicine

## 2019-06-14 ENCOUNTER — Encounter (HOSPITAL_COMMUNITY): Payer: Self-pay | Admitting: Emergency Medicine

## 2019-06-14 ENCOUNTER — Other Ambulatory Visit: Payer: Self-pay

## 2019-06-14 DIAGNOSIS — N939 Abnormal uterine and vaginal bleeding, unspecified: Secondary | ICD-10-CM | POA: Insufficient documentation

## 2019-06-14 LAB — I-STAT CHEM 8, ED
BUN: 3 mg/dL — ABNORMAL LOW (ref 6–20)
Calcium, Ion: 1.16 mmol/L (ref 1.15–1.40)
Chloride: 104 mmol/L (ref 98–111)
Creatinine, Ser: 0.5 mg/dL (ref 0.44–1.00)
Glucose, Bld: 96 mg/dL (ref 70–99)
HCT: 32 % — ABNORMAL LOW (ref 36.0–46.0)
Hemoglobin: 10.9 g/dL — ABNORMAL LOW (ref 12.0–15.0)
Potassium: 3.7 mmol/L (ref 3.5–5.1)
Sodium: 139 mmol/L (ref 135–145)
TCO2: 23 mmol/L (ref 22–32)

## 2019-06-14 LAB — URINALYSIS, ROUTINE W REFLEX MICROSCOPIC
Bilirubin Urine: NEGATIVE
Glucose, UA: NEGATIVE mg/dL
Ketones, ur: 20 mg/dL — AB
Nitrite: NEGATIVE
Protein, ur: 30 mg/dL — AB
RBC / HPF: >50 RBC/hpf — ABNORMAL HIGH (ref 0–5)
Specific Gravity, Urine: 1.016 (ref 1.005–1.030)
pH: 7 (ref 5.0–8.0)

## 2019-06-14 LAB — WET PREP, GENITAL
Clue Cells Wet Prep HPF POC: NONE SEEN
Sperm: NONE SEEN
Trich, Wet Prep: NONE SEEN
Yeast Wet Prep HPF POC: NONE SEEN

## 2019-06-14 LAB — I-STAT BETA HCG BLOOD, ED (MC, WL, AP ONLY): I-stat hCG, quantitative: <5 m[IU]/mL (ref <5)

## 2019-06-14 LAB — HIV ANTIBODY (ROUTINE TESTING W REFLEX): HIV Screen 4th Generation wRfx: NONREACTIVE

## 2019-06-14 NOTE — Discharge Instructions (Signed)
Start taking iron as needed for you.  You may buy this over-the-counter.  Follow-up outpatient with the [facility] clinic for possible medications stop your vaginal bleeding.  If you develop dizziness, lightheadedness, pass out please seek reevaluation the emergency department.

## 2019-06-14 NOTE — ED Provider Notes (Signed)
[facility] DEPART[facility] Provider Note   CSN: [id number] Arrival date & time: 06/14/19  1243   History   Chief Complaint Chief Complaint  Patient presents with  . Vaginal Bleeding   HPI [name] [name] is a 28 y.o. female with past medical history significant for gonorrhea, chlamydia, anemia, trichomonas presents for evaluation of vaginal bleeding.  Patient states she has had intermittent menstrual cycle over the last 2 weeks.  She is having to change her pad approximately 2 times during the day and she wears 1 pad during the night.  She denies any recent sexual activity or concerns about anemia.  He does have a diagnosis of anemia and her past medical history however patient does not know about this.  She denies headache, dizziness, nausea, vomiting, chest pain, shortness of breath, abdominal pain, diarrhea, dysuria, pelvic pain, vaginal discharge.  She has no concerns for STDs.  She denies additional aggravating or alleviating factors.  History obtained from patient and past medical records.  No interpreter is used.    HPI  Past Medical History:  Diagnosis Date  . Chlamydia    06/15/14 w/tx, 10/11/14 w/tx, TOC-neg x2  . Eczema   . Gonorrhea 06/15/14   w/tx, TOC- neg  . History of anemia   . Infection    chlamydia & gonorrhea  . Trichomonas     Patient Active Problem List   Diagnosis Date Noted  . Encounter for female sterilization procedure     Past Surgical History:  Procedure Laterality Date  . fractured finger as a child    . LAPAROSCOPIC TUBAL LIGATION Bilateral 03/16/2015   Procedure: LAPAROSCOPIC TUBAL LIGATION WITH FILSHIE CLIPS;  Surgeon: Osborne Oman, MD;  Location: [facility];  Service: Gynecology;  Laterality: Bilateral;  With Filshie clips  Requested 03/16/15 @ 1:00p  . WISDOM TOOTH EXTRACTION       OB History    Gravida  5   Para  4   Term  4   Preterm      AB  1   Living  4     SAB  1   TAB      Ectopic       Multiple  0   Live Births  4            Home Medications    Prior to Admission medications   Not on File    Family History Family History  Problem Relation Age of Onset  . Asthma Sister   . Heart disease Maternal Grandmother   . Other Neg Hx     Social History Social History   Tobacco Use  . Smoking status: Never Smoker  . Smokeless tobacco: Never Used  Substance Use Topics  . Alcohol use: No  . Drug use: No     Allergies   Patient has no known allergies.   Review of Systems Review of Systems  Constitutional: Negative.   HENT: Negative.   Respiratory: Negative.   Cardiovascular: Negative.   Gastrointestinal: Negative.   Genitourinary: Positive for menstrual problem and vaginal bleeding. Negative for decreased urine volume, difficulty urinating, dysuria, flank pain, frequency, genital sores, hematuria, pelvic pain, urgency, vaginal discharge and vaginal pain.  Skin: Negative.   Neurological: Negative.   All other systems reviewed and are negative.  Physical Exam Updated Vital Signs BP (!) 135/93 (BP Location: Left Arm)   Pulse (!) 57   Temp 98.2 F (36.8 C) (Oral)   Resp 14  Ht 5\' 5"  (1.651 m)   Wt 74.8 kg   LMP 06/14/2019   SpO2 100%   BMI 27.46 kg/m   Physical Exam Vitals signs and nursing note reviewed. Exam conducted with a chaperone present.  Constitutional:      General: She is not in acute distress.    Appearance: She is well-developed. She is not ill-appearing or toxic-appearing.  HENT:     Head: Normocephalic and atraumatic.     Nose: Nose normal.     Mouth/Throat:     Mouth: Mucous membranes are moist.     Pharynx: Oropharynx is clear.  Eyes:     Pupils: Pupils are equal, round, and reactive to light.  Neck:     Musculoskeletal: Normal range of motion.  Cardiovascular:     Rate and Rhythm: Normal rate.     Pulses: Normal pulses.     Heart sounds: Normal heart sounds.  Pulmonary:     Effort: Pulmonary effort is normal.  No respiratory distress.     Breath sounds: Normal breath sounds. No stridor.  Abdominal:     General: Bowel sounds are normal. There is no distension.     Tenderness: There is no abdominal tenderness. There is no right CVA tenderness, left CVA tenderness, guarding or rebound.  Genitourinary:    Comments: Female tech present during the exam. Normal appearing external female genitalia without rashes or lesions, normal vaginal epithelium. Normal appearing cervix without discharge or petechiae. Cervical os is closed. There is dark red scant bleeding noted at the os. No odor. Bimanual: No CMT, nontender.  No palpable adnexal masses or tenderness. Uterus midline and not fixed. Rectovaginal exam was deferred.  No cystocele or rectocele noted. No pelvic lymphadenopathy noted. Wet prep was obtained.  Cultures for gonorrhea and chlamydia collected. Exam performed with chaperone in room. Musculoskeletal: Normal range of motion.     Comments: Moves all 4 extremities without difficulty.  Skin:    General: Skin is warm and dry.     Comments: Brisk capillary refill  Neurological:     Mental Status: She is alert.     Comments: Cranial nerves II through XII grossly intact.  No facial droop.  Ambulatory that difficulty.    ED Treatments / Results  Labs (all labs ordered are listed, but only abnormal results are displayed) Labs Reviewed  WET PREP, GENITAL - Abnormal; Notable for the following components:      Result Value   WBC, Wet Prep HPF POC MODERATE (*)    All other components within normal limits  URINALYSIS, ROUTINE W REFLEX MICROSCOPIC - Abnormal; Notable for the following components:   APPearance HAZY (*)    Hgb urine dipstick LARGE (*)    Ketones, ur 20 (*)    Protein, ur 30 (*)    Leukocytes,Ua TRACE (*)    RBC / HPF >50 (*)    Bacteria, UA RARE (*)    All other components within normal limits  I-STAT CHEM 8, ED - Abnormal; Notable for the following components:   BUN 3 (*)    Hemoglobin  10.9 (*)    HCT 32.0 (*)    All other components within normal limits  RPR  HIV ANTIBODY (ROUTINE TESTING W REFLEX)  I-STAT BETA HCG BLOOD, ED ([facility], [facility], [facility] ONLY)  GC/CHLAMYDIA PROBE AMP ([place]) NOT AT [facility]    EKG None  Radiology No results found.  Procedures Procedures (including critical care time)  Medications Ordered in ED Medications -  No data to display  Initial Impression / Assessment and Plan / ED Course  I have reviewed the triage vital signs and the nursing notes.  Pertinent labs & imaging results that were available during my care of the patient were reviewed by me and considered in my medical decision making (see chart for details).  28 year old female appears otherwise well presents for evaluation of vaginal bleeding.  Intermittent vaginal bleeding over the last 2 weeks.  She changes her pad approximately twice daily.  Denies dizziness, lightheadedness.  No concerns for STDs.  GU exam with scant dark red blood in vaginal vault.  No CMT tenderness, adnexal tenderness.  Abdomen soft, nontender without rebound or guarding.  She is afebrile without tachycardia, tachypnea or hypoxia.  She is tolerating p.o. intake at home.  Her urinalysis does show trace leuks and rare bacteria however she does not have any urinary symptoms.  Patient does not be started on antibiotics for this at this time which I feel is reasonable.  Pregnancy test negative, hemoglobin stable. Does not want empiric testing for STDs. Patient knows her STD panel is pending.  He does not want to start any medicines for abnormal bleeding here.  I have given her resources for OB/GYN follow-up.  Pt presents with concerns for possible STD.  Pt understands that they have GC/Chlamydia cultures pending and that they will need to inform all sexual partners if results return positive. Pt not concerning for PID because hemodynamically stable and no cervical motion tenderness on pelvic exam.  Patient to be discharged  with instructions to follow up with OBGYN/PCP. Discussed importance of using protection when sexually active.       Final Clinical Impressions(s) / ED Diagnoses   Final diagnoses:  Abnormal vaginal bleeding    ED Discharge Orders    None       Jearlene Bridwell A, PA-C 06/14/19 1653    Linwood DibblesKnapp, Jon, MD 06/15/19 [phone]

## 2019-06-14 NOTE — ED Notes (Signed)
Pt verbalized understanding of discharge paperwork and follow-up care.  °

## 2019-06-14 NOTE — ED Triage Notes (Signed)
Patient c/o vaginal bleeding since 10/5 along with some mild cramping. Taking tylenol regularly for cramping.

## 2019-06-15 LAB — GC/CHLAMYDIA PROBE AMP (~~LOC~~) NOT AT ARMC
Chlamydia: POSITIVE — AB
Neisseria Gonorrhea: NEGATIVE

## 2019-06-15 LAB — RPR: RPR Ser Ql: NONREACTIVE

## 2019-06-24 ENCOUNTER — Encounter: Payer: Self-pay | Admitting: Obstetrics & Gynecology

## 2019-06-24 ENCOUNTER — Other Ambulatory Visit: Payer: Self-pay

## 2019-06-24 NOTE — Progress Notes (Signed)
1340- attempted to contact pt for mychart visit. LVM informing pt of attempt. Will call back in 10 to 15 minutes.   1355- second attempt to call pt for mychart visit. LVM informing her of attempt and encouraged pt to give the office a call to reschedule.

## 2019-06-25 ENCOUNTER — Encounter: Payer: Self-pay | Admitting: *Deleted

## 2019-06-25 ENCOUNTER — Telehealth: Payer: Self-pay | Admitting: *Deleted

## 2019-06-25 DIAGNOSIS — A749 Chlamydial infection, unspecified: Secondary | ICD-10-CM

## 2019-06-25 MED ORDER — AZITHROMYCIN 250 MG PO TABS: ORAL_TABLET | ORAL | 1 refills | Status: DC

## 2019-06-25 NOTE — Telephone Encounter (Signed)
I called [name] [name] and notified her of diagnosis of Chlamydia from her ED visit and that they could not reach her. I informed her we will send in Zithromax for her to take - all 4 tablets at once. I explained her partner needs to be treated also and they should avoid intercourse/ intimate contact until 1 week after both have been treated. I also explained partner can go to their provider or health department or if needed I can include a refill with her rx as long as she knows whether her partner has medication allergies( per protocol for expedited partner treatment). She confirms she would like me to include refill for partner and that partner does not have any allergies. I also informed her she missed her follow up appointment with our department and she should call for appointment. She voices understanding. STD form for [facility] also completed and sent.  Jacques Navy

## 2019-06-25 NOTE — Telephone Encounter (Signed)
-----   Message from Lavonia Drafts, MD sent at 06/25/2019  9:14 AM EDT ----- This pt was seen in the ED. She was dx'd with Chlamydia. They could not reach her and she was scheduled for a visit yesterday but, did not show. Please try to reach and treat her. If she does not answer, please send her a certified letter. She and any partners need tx.  Thx,  Clh-S

## 2019-12-21 ENCOUNTER — Other Ambulatory Visit: Payer: Self-pay

## 2019-12-21 ENCOUNTER — Emergency Department (HOSPITAL_COMMUNITY)
Admission: EM | Admit: 2019-12-21 | Discharge: 2019-12-22 | Disposition: A | Discharge status: Home / Self Care | Payer: Self-pay | Attending: Emergency Medicine | Admitting: Emergency Medicine

## 2019-12-21 ENCOUNTER — Encounter (HOSPITAL_COMMUNITY): Payer: Self-pay | Admitting: Emergency Medicine

## 2019-12-21 DIAGNOSIS — Z5321 Procedure and treatment not carried out due to patient leaving prior to being seen by health care provider: Secondary | ICD-10-CM | POA: Insufficient documentation

## 2019-12-21 DIAGNOSIS — R109 Unspecified abdominal pain: Secondary | ICD-10-CM | POA: Insufficient documentation

## 2019-12-21 MED ORDER — SODIUM CHLORIDE 0.9% FLUSH: 3.0000 mL | Freq: Once | INTRAVENOUS | Status: DC

## 2019-12-21 NOTE — ED Triage Notes (Addendum)
Patient with abdominal pain and nausea and vomiting since this morning.  Patient does have history of ulcer.  She states that she took her meds for the ulcer with no relief.

## 2019-12-22 ENCOUNTER — Emergency Department (HOSPITAL_BASED_OUTPATIENT_CLINIC_OR_DEPARTMENT_OTHER)
Admission: EM | Admit: 2019-12-22 | Discharge: 2019-12-22 | Disposition: A | Discharge status: Home / Self Care | Payer: Self-pay | Attending: Emergency Medicine | Admitting: Emergency Medicine

## 2019-12-22 ENCOUNTER — Encounter (HOSPITAL_BASED_OUTPATIENT_CLINIC_OR_DEPARTMENT_OTHER): Payer: Self-pay

## 2019-12-22 ENCOUNTER — Emergency Department (HOSPITAL_COMMUNITY)
Admission: EM | Admit: 2019-12-22 | Discharge: 2019-12-22 | Disposition: A | Discharge status: Home / Self Care | Payer: Self-pay | Attending: Emergency Medicine | Admitting: Emergency Medicine

## 2019-12-22 ENCOUNTER — Other Ambulatory Visit: Payer: Self-pay

## 2019-12-22 ENCOUNTER — Ambulatory Visit (HOSPITAL_COMMUNITY)
Admission: EM | Admit: 2019-12-22 | Discharge: 2019-12-22 | Disposition: A | Discharge status: Home / Self Care | Payer: Self-pay

## 2019-12-22 DIAGNOSIS — Z5321 Procedure and treatment not carried out due to patient leaving prior to being seen by health care provider: Secondary | ICD-10-CM | POA: Insufficient documentation

## 2019-12-22 DIAGNOSIS — R112 Nausea with vomiting, unspecified: Secondary | ICD-10-CM | POA: Insufficient documentation

## 2019-12-22 DIAGNOSIS — R109 Unspecified abdominal pain: Secondary | ICD-10-CM | POA: Insufficient documentation

## 2019-12-22 DIAGNOSIS — R1084 Generalized abdominal pain: Secondary | ICD-10-CM | POA: Insufficient documentation

## 2019-12-22 LAB — URINALYSIS, MICROSCOPIC (REFLEX)

## 2019-12-22 LAB — CBC
HCT: 31 % — ABNORMAL LOW (ref 36.0–46.0)
HCT: 31.2 % — ABNORMAL LOW (ref 36.0–46.0)
Hemoglobin: 9.4 g/dL — ABNORMAL LOW (ref 12.0–15.0)
Hemoglobin: 9.9 g/dL — ABNORMAL LOW (ref 12.0–15.0)
MCH: 24.2 pg — ABNORMAL LOW (ref 26.0–34.0)
MCH: 24.8 pg — ABNORMAL LOW (ref 26.0–34.0)
MCHC: 30.1 g/dL (ref 30.0–36.0)
MCHC: 31.9 g/dL (ref 30.0–36.0)
MCV: 77.5 fL — ABNORMAL LOW (ref 80.0–100.0)
MCV: 80.2 fL (ref 80.0–100.0)
Platelets: 266 10*3/uL (ref 150–400)
Platelets: 266 10*3/uL (ref 150–400)
RBC: 3.89 MIL/uL (ref 3.87–5.11)
RBC: 4 MIL/uL (ref 3.87–5.11)
RDW: 17.8 % — ABNORMAL HIGH (ref 11.5–15.5)
RDW: 18 % — ABNORMAL HIGH (ref 11.5–15.5)
WBC: 10.5 10*3/uL (ref 4.0–10.5)
WBC: 8.3 10*3/uL (ref 4.0–10.5)
nRBC: 0 % (ref 0.0–0.2)
nRBC: 0 % (ref 0.0–0.2)

## 2019-12-22 LAB — COMPREHENSIVE METABOLIC PANEL
ALT: 30 U/L (ref 0–44)
ALT: 32 U/L (ref 0–44)
AST: 28 U/L (ref 15–41)
AST: 29 U/L (ref 15–41)
Albumin: 4.3 g/dL (ref 3.5–5.0)
Albumin: 4.7 g/dL (ref 3.5–5.0)
Alkaline Phosphatase: 61 U/L (ref 38–126)
Alkaline Phosphatase: 60 U/L (ref 38–126)
Anion gap: 12 (ref 5–15)
Anion gap: 10 (ref 5–15)
BUN: 9 mg/dL (ref 6–20)
BUN: 16 mg/dL (ref 6–20)
CO2: 22 mmol/L (ref 22–32)
CO2: 26 mmol/L (ref 22–32)
Calcium: 9.7 mg/dL (ref 8.9–10.3)
Calcium: 9.8 mg/dL (ref 8.9–10.3)
Chloride: 103 mmol/L (ref 98–111)
Chloride: 101 mmol/L (ref 98–111)
Creatinine, Ser: 0.63 mg/dL (ref 0.44–1.00)
Creatinine, Ser: 0.57 mg/dL (ref 0.44–1.00)
GFR calc Af Amer: >60 mL/min (ref >60)
GFR calc Af Amer: >60 mL/min (ref >60)
GFR calc non Af Amer: >60 mL/min (ref >60)
GFR calc non Af Amer: >60 mL/min (ref >60)
Glucose, Bld: 164 mg/dL — ABNORMAL HIGH (ref 70–99)
Glucose, Bld: 105 mg/dL — ABNORMAL HIGH (ref 70–99)
Potassium: 3.8 mmol/L (ref 3.5–5.1)
Potassium: 3.5 mmol/L (ref 3.5–5.1)
Sodium: 137 mmol/L (ref 135–145)
Sodium: 137 mmol/L (ref 135–145)
Total Bilirubin: 1 mg/dL (ref 0.3–1.2)
Total Bilirubin: 0.6 mg/dL (ref 0.3–1.2)
Total Protein: 8 g/dL (ref 6.5–8.1)
Total Protein: 9.2 g/dL — ABNORMAL HIGH (ref 6.5–8.1)

## 2019-12-22 LAB — URINALYSIS, ROUTINE W REFLEX MICROSCOPIC
Bilirubin Urine: NEGATIVE
Glucose, UA: NEGATIVE mg/dL
Ketones, ur: >80 mg/dL — AB
Leukocytes,Ua: NEGATIVE
Nitrite: NEGATIVE
Protein, ur: 100 mg/dL — AB
Specific Gravity, Urine: 1.025 (ref 1.005–1.030)
pH: 6.5 (ref 5.0–8.0)

## 2019-12-22 LAB — I-STAT BETA HCG BLOOD, ED (MC, WL, AP ONLY): I-stat hCG, quantitative: <5 m[IU]/mL (ref <5)

## 2019-12-22 LAB — PREGNANCY, URINE: Preg Test, Ur: NEGATIVE

## 2019-12-22 LAB — LIPASE, BLOOD
Lipase: 20 U/L (ref 11–51)
Lipase: 24 U/L (ref 11–51)

## 2019-12-22 MED ORDER — SODIUM CHLORIDE 0.9% FLUSH: 3.0000 mL | Freq: Once | INTRAVENOUS | Status: DC

## 2019-12-22 MED ORDER — SODIUM CHLORIDE 0.9% FLUSH
3.0000 mL | Freq: Once | INTRAVENOUS | Status: DC
  Filled 2019-12-22: qty 3

## 2019-12-22 NOTE — ED Triage Notes (Signed)
Pt c/o abd pains since yesterday with n/v.

## 2019-12-22 NOTE — ED Triage Notes (Signed)
Pt c/o generalized abd pain n/v x 2 days-denies diarrhea/fever-NAD-to triage in w.c-stood for weight w/o difficulty

## 2019-12-23 ENCOUNTER — Encounter: Payer: Self-pay | Admitting: Nurse Practitioner

## 2019-12-23 ENCOUNTER — Ambulatory Visit (HOSPITAL_COMMUNITY)
Admission: EM | Admit: 2019-12-23 | Discharge: 2019-12-23 | Disposition: A | Discharge status: Home / Self Care | Payer: HRSA Program | Attending: Family Medicine | Admitting: Family Medicine

## 2019-12-23 ENCOUNTER — Encounter (HOSPITAL_COMMUNITY): Payer: Self-pay

## 2019-12-23 DIAGNOSIS — Z20822 Contact with and (suspected) exposure to covid-19: Secondary | ICD-10-CM | POA: Insufficient documentation

## 2019-12-23 DIAGNOSIS — K2900 Acute gastritis without bleeding: Secondary | ICD-10-CM | POA: Diagnosis not present

## 2019-12-23 DIAGNOSIS — R112 Nausea with vomiting, unspecified: Secondary | ICD-10-CM | POA: Diagnosis present

## 2019-12-23 DIAGNOSIS — R197 Diarrhea, unspecified: Secondary | ICD-10-CM | POA: Insufficient documentation

## 2019-12-23 DIAGNOSIS — Z79899 Other long term (current) drug therapy: Secondary | ICD-10-CM | POA: Diagnosis not present

## 2019-12-23 MED ORDER — ONDANSETRON HCL 4 MG/2ML IJ SOLN
4.0000 mg | Freq: Once | INTRAMUSCULAR | Status: AC
  Administered 2019-12-23: 09:00:00 4 mg via INTRAVENOUS

## 2019-12-23 MED ORDER — PROMETHAZINE HCL 25 MG PO TABS
25.0000 mg | ORAL_TABLET | Freq: Four times a day (QID) | ORAL | 0 refills | Status: DC | PRN
Start: 2019-12-23 — End: 2020-01-21

## 2019-12-23 MED ORDER — SUCRALFATE 1 G PO TABS
1.0000 g | ORAL_TABLET | Freq: Three times a day (TID) | ORAL | 0 refills | Status: DC
Start: 2019-12-23 — End: 2020-02-24

## 2019-12-23 MED ORDER — ONDANSETRON HCL 4 MG/2ML IJ SOLN
INTRAMUSCULAR | Status: AC
  Filled 2019-12-23: qty 2

## 2019-12-23 MED ORDER — SODIUM CHLORIDE 0.9 % IV SOLN: Freq: Once | INTRAVENOUS | Status: AC

## 2019-12-23 MED ORDER — OMEPRAZOLE 20 MG PO CPDR
20.0000 mg | DELAYED_RELEASE_CAPSULE | Freq: Every day | ORAL | 1 refills | Status: DC
Start: 2019-12-23 — End: 2020-01-21

## 2019-12-23 NOTE — ED Notes (Signed)
20G PIV started in the left [facility] x1 attempt. Patient tolerated well. Fluid running.

## 2019-12-23 NOTE — ED Triage Notes (Signed)
Patient reports fever, abdominal pain, nausea and vomiting for three days now.

## 2019-12-23 NOTE — Discharge Instructions (Addendum)
Treating you for acute gastritis  Take the medication as prescribed. I would recommend if this continues to follow-up with GI specialist you have seen previously. Make sure you are sipping fluids and staying hydrated Follow up as needed for continued or worsening symptoms  We are going to try omeprazole 20 mg once daily  Make sure that you take the omeprazole 30- 60 minutes prior to a meal with a glass of water.  Avoid spicy, greasy foods, caffeine, chocolate and milk products.  No eating 2-3 hours before bedtime. Elevate the head of the bed 30 degrees.  Carafate 4 times a day before meals and at bedtime.  Try this for a few weeks to see if this improves your symptoms.  If you don't see any improvement or your symptoms worsen please follow up with a GI

## 2019-12-24 LAB — SARS CORONAVIRUS 2 (TAT 6-24 HRS): SARS Coronavirus 2: NEGATIVE

## 2019-12-26 ENCOUNTER — Other Ambulatory Visit: Payer: Self-pay

## 2019-12-26 ENCOUNTER — Ambulatory Visit (HOSPITAL_COMMUNITY)
Admission: EM | Admit: 2019-12-26 | Discharge: 2019-12-26 | Disposition: A | Discharge status: Home / Self Care | Payer: Self-pay

## 2019-12-26 NOTE — ED Notes (Signed)
Patient is being discharged from the Urgent Care Center and sent to the Emergency Department via personal vehicle by family member. Per Provider Moshe Cipro, patient is stable but in need of higher level of care due to continued weakness and severe abdominal pain. Patient is aware and verbalizes understanding of plan of care. There were no vitals filed for this visit.

## 2019-12-27 NOTE — ED Provider Notes (Signed)
[facility]    CSN: [id number] Arrival date & time: 12/23/19  0809      History   Chief Complaint Chief Complaint  Patient presents with  . Fever  . Abdominal Pain  . Emesis    HPI [name] [name] is a 29 y.o. female.   Patient is a 29 year old female who presents today with nausea, vomiting, diarrhea for 3 days.  Symptoms have been constant.  She is unable to keep anything down.  Some pain in the upper abdomen.  Denies any blood in stool or vomit.  No fever, chills, body aches or night sweats.  Is not currently trying anything for her symptoms.  Denies any alcohol or drug use.  ROS per HPI      Past Medical History:  Diagnosis Date  . Chlamydia    06/15/14 w/tx, 10/11/14 w/tx, TOC-neg x2  . Eczema   . Gonorrhea 06/15/14   w/tx, TOC- neg  . History of anemia   . Infection    chlamydia & gonorrhea  . Trichomonas     Patient Active Problem List   Diagnosis Date Noted  . Encounter for female sterilization procedure     Past Surgical History:  Procedure Laterality Date  . fractured finger as a child    . LAPAROSCOPIC TUBAL LIGATION Bilateral 03/16/2015   Procedure: LAPAROSCOPIC TUBAL LIGATION WITH FILSHIE CLIPS;  Surgeon: Tereso Newcomer, MD;  Location: [facility];  Service: Gynecology;  Laterality: Bilateral;  With Filshie clips  Requested 03/16/15 @ 1:00p  . WISDOM TOOTH EXTRACTION      OB History    Gravida  5   Para  4   Term  4   Preterm      AB  1   Living  4     SAB  1   TAB      Ectopic      Multiple  0   Live Births  4            Home Medications    Prior to Admission medications   Medication Sig Start Date End Date Taking? Authorizing Provider  azithromycin (ZITHROMAX) 250 MG tablet Take all four tablets at once 06/25/19   Willodean Rosenthal, MD  omeprazole (PRILOSEC) 20 MG capsule Take 1 capsule (20 mg total) by mouth daily. 12/23/19   Janace Aris, NP  promethazine (PHENERGAN) 25 MG tablet Take 1 tablet  (25 mg total) by mouth every 6 (six) hours as needed for nausea or vomiting. 12/23/19   Dahlia Byes A, NP  sucralfate (CARAFATE) 1 g tablet Take 1 tablet (1 g total) by mouth 4 (four) times daily -  with meals and at bedtime. 12/23/19 01/22/20  Janace Aris, NP    Family History Family History  Problem Relation Age of Onset  . Asthma Sister   . Heart disease Maternal Grandmother   . Other Neg Hx     Social History Social History   Tobacco Use  . Smoking status: Never Smoker  . Smokeless tobacco: Never Used  Substance Use Topics  . Alcohol use: No  . Drug use: No     Allergies   Patient has no known allergies.   Review of Systems Review of Systems   Physical Exam Triage Vital Signs ED Triage Vitals  Enc Vitals Group     BP 12/23/19 0827 122/74     Pulse Rate 12/23/19 0827 (!) 54     Resp 12/23/19 0827 (!) 22  Temp 12/23/19 0827 98.1 F (36.7 C)     Temp Source 12/23/19 0827 Oral     SpO2 12/23/19 0827 100 %     Weight --      Height --      Head Circumference --      Peak Flow --      Pain Score 12/23/19 0826 9     Pain Loc --      Pain Edu? --      Excl. in GC? --    No data found.  Updated Vital Signs BP 122/74 (BP Location: Left Arm)   Pulse (!) 54   Temp 98.1 F (36.7 C) (Oral)   Resp (!) 22   LMP 12/01/2019   SpO2 100%   Visual Acuity Right Eye Distance:   Left Eye Distance:   Bilateral Distance:    Right Eye Near:   Left Eye Near:    Bilateral Near:     Physical Exam Vitals and nursing note reviewed.  Constitutional:      General: She is not in acute distress.    Appearance: Normal appearance. She is ill-appearing. She is not toxic-appearing or diaphoretic.  HENT:     Head: Normocephalic.     Nose: Nose normal.     Mouth/Throat:     Pharynx: Oropharynx is clear.  Eyes:     Conjunctiva/sclera: Conjunctivae normal.  Pulmonary:     Effort: Pulmonary effort is normal.  Abdominal:     Palpations: Abdomen is soft.     Tenderness:  There is abdominal tenderness in the epigastric area and left upper quadrant. There is no guarding or rebound. Negative signs include Murphy's sign.  Musculoskeletal:        General: Normal range of motion.     Cervical back: Normal range of motion.  Skin:    General: Skin is warm and dry.     Findings: No rash.  Neurological:     Mental Status: She is alert.  Psychiatric:        Mood and Affect: Mood normal.      UC Treatments / Results  Labs (all labs ordered are listed, but only abnormal results are displayed) Labs Reviewed  SARS CORONAVIRUS 2 (TAT 6-24 HRS)    EKG   Radiology No results found.  Procedures Procedures (including critical care time)  Medications Ordered in UC Medications  0.9 %  sodium chloride infusion ( Intravenous Stopped 12/23/19 0948)  ondansetron (ZOFRAN) injection 4 mg (4 mg Intravenous Given 12/23/19 0855)    Initial Impression / Assessment and Plan / UC Course  I have reviewed the triage vital signs and the nursing notes.  Pertinent labs & imaging results that were available during my care of the patient were reviewed by me and considered in my medical decision making (see chart for details).     Gastritis Patient rehydrated here with normal saline bolus and given Zofran for nausea.  Patient feeling much better after treatment. Treating for acute gastritis with omeprazole daily and Carafate 4 times a day as prescribed. Phenergan as needed for nausea, vomiting Lab work reviewed from ER and patient mildly anemic otherwise lab work unremarkable Patient with history of anemia. Doubt active GI bleeding at this time. Recommended follow-up with GI specialist for any continued or worsening symptoms  Final Clinical Impressions(s) / UC Diagnoses   Final diagnoses:  Acute gastritis without hemorrhage, unspecified gastritis type     Discharge Instructions     Treating you  for acute gastritis  Take the medication as prescribed. I would  recommend if this continues to follow-up with GI specialist you have seen previously. Make sure you are sipping fluids and staying hydrated Follow up as needed for continued or worsening symptoms  We are going to try omeprazole 20 mg once daily  Make sure that you take the omeprazole 30- 60 minutes prior to a meal with a glass of water.  Avoid spicy, greasy foods, caffeine, chocolate and milk products.  No eating 2-3 hours before bedtime. Elevate the head of the bed 30 degrees.  Carafate 4 times a day before meals and at bedtime.  Try this for a few weeks to see if this improves your symptoms.  If you don't see any improvement or your symptoms worsen please follow up with a GI      ED Prescriptions    Medication Sig Dispense Auth. Provider   omeprazole (PRILOSEC) 20 MG capsule Take 1 capsule (20 mg total) by mouth daily. 30 capsule Gwenyth Dingee A, NP   sucralfate (CARAFATE) 1 g tablet Take 1 tablet (1 g total) by mouth 4 (four) times daily -  with meals and at bedtime. 120 tablet Edona Schreffler A, NP   promethazine (PHENERGAN) 25 MG tablet Take 1 tablet (25 mg total) by mouth every 6 (six) hours as needed for nausea or vomiting. 30 tablet Loura Halt A, NP     PDMP not reviewed this encounter.   Orvan July, NP 12/27/19 [phone]

## 2019-12-31 ENCOUNTER — Encounter: Payer: Self-pay | Admitting: Gastroenterology

## 2019-12-31 ENCOUNTER — Ambulatory Visit: Payer: Self-pay | Admitting: Gastroenterology

## 2019-12-31 ENCOUNTER — Other Ambulatory Visit: Payer: Self-pay

## 2019-12-31 VITALS — BP 108/66 | HR 60 | Temp 97.7°F | Ht 64.0 in | Wt 156.6 lb

## 2019-12-31 DIAGNOSIS — D509 Iron deficiency anemia, unspecified: Secondary | ICD-10-CM

## 2019-12-31 DIAGNOSIS — R1013 Epigastric pain: Secondary | ICD-10-CM | POA: Insufficient documentation

## 2019-12-31 DIAGNOSIS — R112 Nausea with vomiting, unspecified: Secondary | ICD-10-CM | POA: Insufficient documentation

## 2019-12-31 MED ORDER — FERROUS SULFATE 325 (65 FE) MG PO TBEC: 325.0000 mg | DELAYED_RELEASE_TABLET | Freq: Every day | ORAL | 3 refills | Status: AC

## 2019-12-31 NOTE — Progress Notes (Signed)
Reviewed and agree with documentation and assessment and plan. K. Veena Kyley Solow , MD   

## 2019-12-31 NOTE — Addendum Note (Signed)
Addended by: Lamona Curl on: 12/31/2019 03:56 PM   Modules accepted: Orders

## 2019-12-31 NOTE — Patient Instructions (Addendum)
If you are age 27 or older, your body mass index should be between 23-30. Your Body mass index is 26.88 kg/m. If this is out of the aforementioned range listed, please consider follow up with your Primary Care Provider.  If you are age 35 or younger, your body mass index should be between 19-25. Your Body mass index is 26.88 kg/m. If this is out of the aformentioned range listed, please consider follow up with your Primary Care Provider.   You have been scheduled for a HIDA scan at [facility] (1st floor) on 01-19-20. Please arrive 15 minutes prior to your scheduled appointment at  7:03JK. Make certain not to have anything to eat or drink after midnight the night prior to your test. You should hold oxycodone after midnight the night prior to your test. Should this appointment date or time not work well for you, please call radiology scheduling at [phone].  _____________________________________________________________________ hepatobiliary (HIDA) scan is an imaging procedure used to diagnose problems in the liver, gallbladder and bile ducts. In the HIDA scan, a radioactive chemical or tracer is injected into a vein in your arm. The tracer is handled by the liver like bile. Bile is a fluid produced and excreted by your liver that helps your digestive system break down fats in the foods you eat. Bile is stored in your gallbladder and the gallbladder releases the bile when you eat a meal. A special nuclear medicine scanner (gamma camera) tracks the flow of the tracer from your liver into your gallbladder and small intestine.  During your HIDA scan  You'll be asked to change into a hospital gown before your HIDA scan begins. Your health care team will position you on a table, usually on your back. The radioactive tracer is then injected into a vein in your arm.The tracer travels through your bloodstream to your liver, where it's taken up by the bile-producing cells. The radioactive tracer travels  with the bile from your liver into your gallbladder and through your bile ducts to your small intestine.You may feel some pressure while the radioactive tracer is injected into your vein. As you lie on the table, a special gamma camera is positioned over your abdomen taking pictures of the tracer as it moves through your body. The gamma camera takes pictures continually for about an hour. You'll need to keep still during the HIDA scan. This can become uncomfortable, but you may find that you can lessen the discomfort by taking deep breaths and thinking about other things. Tell your health care team if you're uncomfortable. The radiologist will watch on a computer the progress of the radioactive tracer through your body. The HIDA scan may be stopped when the radioactive tracer is seen in the gallbladder and enters your small intestine. This typically takes about an hour. In some cases extra imaging will be performed if original images aren't satisfactory, if morphine is given to help visualize the gallbladder or if the medication CCK is given to look at the contraction of the gallbladder. This test typically takes 2 hours to complete. ________________________________________________________________________  Due to recent changes in healthcare laws, you may see the results of your imaging and laboratory studies on MyChart before your provider has had a chance to review them.  We understand that in some cases there may be results that are confusing or concerning to you. Not all laboratory results come back in the same time frame and the provider may be waiting for multiple results in order to  interpret others.  Please give [place] 48 hours in order for your provider to thoroughly review all the results before contacting the office for clarification of your results.   Please purchase the following medications over the counter and take as directed:  START: Ferrous Sulfate 325 mg one tablet daily at bedtime  Thank you  for entrusting me with your care and choosing [facility].  Janett Billow Zehr,PA-C

## 2019-12-31 NOTE — Progress Notes (Signed)
12/31/2019 [name] [name] [id number] 1990-09-02   HISTORY OF PRESENT ILLNESS: This is a 29 year old female who is new to our office.  She was told to follow-up with GI after recent ER visit for evaluation of complaints of abdominal pain, nausea, vomiting.  She tells me that she has had 3 distinct episodes of this symptom since July 2020.  She states she had one in July, 1 October, and then 1 just at the end of April.  She says that these episodes come out of nowhere, all of a sudden she develops severe epigastric abdominal pain with associated nausea, vomiting, loss of appetite, chills, sweats, weakness.  She says that sometimes these episodes last 7 days.  She had a CT scan of the abdomen and pelvis with contrast performed 5 days ago that was unremarkable for cause of her pain.  CBC, CMP, lipase were all unremarkable except for a mild anemia with hemoglobin of 11.6 g and a low MCV of 75.8 g.  They told her in the ER that she probably has gastritis.  She is on omeprazole 20 mg daily and Carafate tablets 4 times daily.  At this point she is feeling better.  She says that she moves her bowels regularly, no issues with that.  No sign of rectal bleeding.  No blood in her emesis.  In regards to the anemia, she does have menstrual periods and says that they have become heavy recently.  Does not follow with gynecology regularly.  Interestingly after she left I was reviewing her chart further and it appears that at the time of her evaluation for her episode in July and October that she was positive for chlamydia on both of those occasions.  No STD testing was performed during her most recent episode/evaluation at the end of April.   Past Medical History:  Diagnosis Date  . Chlamydia    06/15/14 w/tx, 10/11/14 w/tx, TOC-neg x2  . Eczema   . Gonorrhea 06/15/14   w/tx, TOC- neg  . History of anemia   . Infection    chlamydia & gonorrhea  . Trichomonas    Past Surgical History:  Procedure  Laterality Date  . fractured finger as a child    . LAPAROSCOPIC TUBAL LIGATION Bilateral 03/16/2015   Procedure: LAPAROSCOPIC TUBAL LIGATION WITH FILSHIE CLIPS;  Surgeon: Tereso Newcomer, MD;  Location: [facility];  Service: Gynecology;  Laterality: Bilateral;  With Filshie clips  Requested 03/16/15 @ 1:00p  . WISDOM TOOTH EXTRACTION      reports that she has never smoked. She has never used smokeless tobacco. She reports that she does not drink alcohol or use drugs. family history includes Asthma in her sister; Heart disease in her maternal grandmother. No Known Allergies    Outpatient Encounter Medications as of 12/31/2019  Medication Sig  . azithromycin (ZITHROMAX) 250 MG tablet Take all four tablets at once  . omeprazole (PRILOSEC) 20 MG capsule Take 1 capsule (20 mg total) by mouth daily.  Marland Kitchen oxyCODONE-acetaminophen (PERCOCET/ROXICET) 5-325 MG tablet Take 1 tablet by mouth every 4 (four) hours as needed.  . promethazine (PHENERGAN) 25 MG tablet Take 1 tablet (25 mg total) by mouth every 6 (six) hours as needed for nausea or vomiting.  . sucralfate (CARAFATE) 1 g tablet Take 1 tablet (1 g total) by mouth 4 (four) times daily -  with meals and at bedtime. (Patient not taking: Reported on 12/31/2019)   No facility-administered encounter medications on file as of 12/31/2019.  REVIEW OF SYSTEMS  : All other systems reviewed and negative except where noted in the History of Present Illness.   PHYSICAL EXAM: BP 108/66   Pulse 60   Temp 97.7 F (36.5 C)   Ht 5\' 4"  (1.626 m)   Wt 156 lb 9.6 oz (71 kg)   LMP 12/01/2019   BMI 26.88 kg/m  General: Well developed AA female in no acute distress Head: Normocephalic and atraumatic Eyes:  Sclerae anicteric, conjunctiva pink. Ears: Normal auditory acuity Lungs: Clear throughout to auscultation; no increased WOB. Heart: Regular rate and rhythm; no M/R/G. Abdomen: Soft, non-distended.  BS present.  Mild epigastric TTP. Musculoskeletal:  Symmetrical with no gross deformities  Skin: No lesions on visible extremities Extremities: No edema  Neurological: Alert oriented x 4, grossly non-focal Psychological:  Alert and cooperative. Normal mood and affect  ASSESSMENT AND PLAN: *29 year old female who describes distinct episodes of epigastric abdominal pain that are very sudden in onset and associated with nausea, vomiting, loss of appetite, chills, sweats, and weakness.  She has had 3 episodes since July 2020.  CT scan negative x2 and labs have been unremarkable.  Episodes last several days before resolving.  ER said she likely has gastritis and has been on omeprazole and Carafate.  Does not sound consistent with gastritis, but possibly biliary in origin.  We will plan for HIDA scan with [facility] first.  If that is unrevealing and negative then will likely need EGD.  For now we will continue her omeprazole 20 mg daily and her Carafate 4 times daily.  Is feeling better at this time.  Interestingly after she left I was reviewing her chart further and it appears that at the time of her evaluation for her episode in July and October that she was positive for chlamydia on both of those occasions.  No STD testing was performed during her most recent episode/evaluation at the end of April. *Microcytic anemia: Suspect iron deficiency likely secondary to her heavy menstrual periods.  I have asked her to begin taking ferrous sulfate 325 mg daily.  If heavy bleeding continues and she needs to seek care with a gynecologist.  No GI bleeding.   CC:  No ref. provider found

## 2020-01-05 ENCOUNTER — Ambulatory Visit: Payer: Self-pay | Admitting: Nurse Practitioner

## 2020-01-19 ENCOUNTER — Ambulatory Visit (HOSPITAL_COMMUNITY)
Admission: RE | Admit: 2020-01-19 | Discharge: 2020-01-19 | Disposition: A | Discharge status: Home / Self Care | Payer: Self-pay | Source: Ambulatory Visit | Attending: Gastroenterology | Admitting: Gastroenterology

## 2020-01-19 ENCOUNTER — Other Ambulatory Visit: Payer: Self-pay

## 2020-01-19 DIAGNOSIS — R1013 Epigastric pain: Secondary | ICD-10-CM | POA: Insufficient documentation

## 2020-01-19 DIAGNOSIS — R112 Nausea with vomiting, unspecified: Secondary | ICD-10-CM | POA: Insufficient documentation

## 2020-01-19 MED ORDER — TECHNETIUM TC 99M MEBROFENIN IV KIT
5.5000 | PACK | Freq: Once | INTRAVENOUS | Status: AC | PRN
  Administered 2020-01-19: 5.5 via INTRAVENOUS

## 2020-01-21 ENCOUNTER — Other Ambulatory Visit: Payer: Self-pay | Admitting: Gastroenterology

## 2020-01-21 ENCOUNTER — Other Ambulatory Visit: Payer: Self-pay

## 2020-01-21 DIAGNOSIS — R112 Nausea with vomiting, unspecified: Secondary | ICD-10-CM

## 2020-01-21 DIAGNOSIS — R1013 Epigastric pain: Secondary | ICD-10-CM

## 2020-01-21 MED ORDER — PROMETHAZINE HCL 25 MG PO TABS: 25.0000 mg | ORAL_TABLET | Freq: Four times a day (QID) | ORAL | 0 refills | Status: DC | PRN

## 2020-01-21 MED ORDER — OMEPRAZOLE 20 MG PO CPDR: 20.0000 mg | DELAYED_RELEASE_CAPSULE | Freq: Every day | ORAL | 1 refills | Status: DC

## 2020-01-21 NOTE — Telephone Encounter (Signed)
The pt has been advised that prescriptions sent

## 2020-01-21 NOTE — Telephone Encounter (Signed)
RX for omeprazole and promethazine sent in to pharmacy.

## 2020-01-21 NOTE — Telephone Encounter (Signed)
[name] [name] the pt is going to make appt for EGD and would also like refill on oxycodone, promethazine and omeprazole.  I did advise her that we would not refill pain medications however I told her I would ask about sending a prescription for omeprazole.  Please advise

## 2020-01-21 NOTE — Telephone Encounter (Signed)
Agree.  Okay to send prescription for omeprazole and also promethazine, but will not fill narcotic prescription.  Thank you,  Jess

## 2020-01-25 ENCOUNTER — Encounter: Payer: Self-pay | Admitting: Gastroenterology

## 2020-02-07 ENCOUNTER — Telehealth: Payer: Self-pay | Admitting: Gastroenterology

## 2020-02-07 NOTE — Telephone Encounter (Signed)
On call MD  Dannah called.  Having epi pain and nausea (no vomiting yet) x 4 days. Feels like another similar attack.  29yr old with recurrent N/V/epi pain. Neg CT x 2, HIDA with EF. No assoc HA or associated trigger factors.  No relationship to menstrual cycles.  Plan: -Increase omeprazole 20 mg BID -Continue Phenergan on PRN basis as she has been doing. -Move EGD up (she is scheduled for EGD in mid July).  RG  Jess, Can we get her EGD done as soon as possible.  RG

## 2020-02-08 NOTE — Telephone Encounter (Signed)
Thank you Raj.

## 2020-02-08 NOTE — Telephone Encounter (Signed)
Spoke with the patient. Moved her to the first available [facility] opening on 02/24/20. Scheduled her pre-visit. She states she thinks one of the medications is causing her to have nightmares and disrupted sleep. She also complains of bloating. Reviewed the correct way to take her PPI, avoid taking promethazine at bedtime, use Miralax to create daily soft bowel movements. Call back PRN.

## 2020-02-14 ENCOUNTER — Other Ambulatory Visit: Payer: Self-pay

## 2020-02-14 ENCOUNTER — Ambulatory Visit (AMBULATORY_SURGERY_CENTER): Payer: Self-pay | Admitting: *Deleted

## 2020-02-14 VITALS — Ht 64.0 in | Wt 157.0 lb

## 2020-02-14 DIAGNOSIS — R1013 Epigastric pain: Secondary | ICD-10-CM

## 2020-02-14 DIAGNOSIS — Z01818 Encounter for other preprocedural examination: Secondary | ICD-10-CM

## 2020-02-14 NOTE — Progress Notes (Signed)
No egg or soy allergy known to patient  No issues with past sedation with any surgeries  or procedures, no intubation problems  No diet pills per patient No home 02 use per patient  No blood thinners per patient  Pt denies issues with constipation  No A fib or A flutter  EMMI video sent to pt's e mail   Pt states she has increased nausea and vomited this am- pain in the same epigastric area- she is using Tylenol but states she takes this a lot an dit usually doesn't help- she is using Phenergan that helps with nausea but not the pain or bloating   Due to the COVID-19 pandemic we are asking patients to follow these guidelines. Please only bring one care partner. Please be aware that your care partner may wait in the car in the parking lot or if they feel like they will be too hot to wait in the car, they may wait in the lobby on the 4th floor. All care partners are required to wear a mask the entire time (we do not have any that we can provide them), they need to practice social distancing, and we will do a Covid check for all patient's and care partners when you arrive. Also we will check their temperature and your temperature. If the care partner waits in their car they need to stay in the parking lot the entire time and we will call them on their cell phone when the patient is ready for discharge so they can bring the car to the front of the building. Also all patient's will need to wear a mask into building.

## 2020-02-20 ENCOUNTER — Other Ambulatory Visit: Payer: Self-pay | Admitting: Gastroenterology

## 2020-02-20 DIAGNOSIS — R1013 Epigastric pain: Secondary | ICD-10-CM

## 2020-02-20 DIAGNOSIS — R112 Nausea with vomiting, unspecified: Secondary | ICD-10-CM

## 2020-02-22 ENCOUNTER — Ambulatory Visit (INDEPENDENT_AMBULATORY_CARE_PROVIDER_SITE_OTHER): Payer: Self-pay

## 2020-02-22 ENCOUNTER — Other Ambulatory Visit: Payer: Self-pay | Admitting: Gastroenterology

## 2020-02-22 DIAGNOSIS — Z1159 Encounter for screening for other viral diseases: Secondary | ICD-10-CM

## 2020-02-23 LAB — SARS CORONAVIRUS 2 (TAT 6-24 HRS): SARS Coronavirus 2: NEGATIVE

## 2020-02-24 ENCOUNTER — Encounter: Payer: Self-pay | Admitting: Gastroenterology

## 2020-02-24 ENCOUNTER — Other Ambulatory Visit: Payer: Self-pay

## 2020-02-24 ENCOUNTER — Ambulatory Visit (AMBULATORY_SURGERY_CENTER): Payer: Self-pay | Admitting: Gastroenterology

## 2020-02-24 VITALS — BP 110/77 | HR 80 | Temp 97.7°F | Resp 13 | Ht 64.0 in | Wt 157.0 lb

## 2020-02-24 DIAGNOSIS — R1013 Epigastric pain: Secondary | ICD-10-CM

## 2020-02-24 DIAGNOSIS — D509 Iron deficiency anemia, unspecified: Secondary | ICD-10-CM

## 2020-02-24 DIAGNOSIS — R112 Nausea with vomiting, unspecified: Secondary | ICD-10-CM

## 2020-02-24 DIAGNOSIS — K2970 Gastritis, unspecified, without bleeding: Secondary | ICD-10-CM

## 2020-02-24 HISTORY — PX: UPPER GASTROINTESTINAL ENDOSCOPY: SHX188

## 2020-02-24 MED ORDER — SODIUM CHLORIDE 0.9 % IV SOLN: 500.0000 mL | Freq: Once | INTRAVENOUS | Status: DC

## 2020-02-24 MED ORDER — HYOSCYAMINE SULFATE 0.125 MG SL SUBL: 0.1250 mg | SUBLINGUAL_TABLET | Freq: Two times a day (BID) | SUBLINGUAL | 1 refills | Status: AC | PRN

## 2020-02-24 NOTE — Progress Notes (Signed)
Report to PACU, RN, vss, BBS= Clear.  

## 2020-02-24 NOTE — Patient Instructions (Signed)
Await pathology results.   YOU HAD AN ENDOSCOPIC PROCEDURE TODAY AT THE [facility]:   Refer to the procedure report that was given to you for any specific questions about what was found during the examination.  If the procedure report does not answer your questions, please call your gastroenterologist to clarify.  If you requested that your care partner not be given the details of your procedure findings, then the procedure report has been included in a sealed envelope for you to review at your convenience later.  YOU SHOULD EXPECT: Some feelings of bloating in the abdomen. Passage of more gas than usual.  Walking can help get rid of the air that was put into your GI tract during the procedure and reduce the bloating. If you had a lower endoscopy (such as a colonoscopy or flexible sigmoidoscopy) you may notice spotting of blood in your stool or on the toilet paper. If you underwent a bowel prep for your procedure, you may not have a normal bowel movement for a few days.  Please Note:  You might notice some irritation and congestion in your nose or some drainage.  This is from the oxygen used during your procedure.  There is no need for concern and it should clear up in a day or so.  SYMPTOMS TO REPORT IMMEDIATELY:    Following upper endoscopy (EGD)  Vomiting of blood or coffee ground material  New chest pain or pain under the shoulder blades  Painful or persistently difficult swallowing  New shortness of breath  Fever of 100F or higher  Black, tarry-looking stools  For urgent or emergent issues, a gastroenterologist can be reached at any hour by calling [phone]. Do not use MyChart messaging for urgent concerns.    DIET:  We do recommend a small meal at first, but then you may proceed to your regular diet.  Drink plenty of fluids but you should avoid alcoholic beverages for 24 hours.  ACTIVITY:  You should plan to take it easy for the rest of today and you should NOT  DRIVE or use heavy machinery until tomorrow (because of the sedation medicines used during the test).    FOLLOW UP: Our staff will call the number listed on your records 48-72 hours following your procedure to check on you and address any questions or concerns that you may have regarding the information given to you following your procedure. If we do not reach you, we will leave a message.  We will attempt to reach you two times.  During this call, we will ask if you have developed any symptoms of COVID 19. If you develop any symptoms (ie: fever, flu-like symptoms, shortness of breath, cough etc.) before then, please call [phone].  If you test positive for Covid 19 in the 2 weeks post procedure, please call and report this information to us.    If any biopsies were taken you will be contacted by phone or by letter within the next 1-3 weeks.  Please call us at [phone] if you have not heard about the biopsies in 3 weeks.    SIGNATURES/CONFIDENTIALITY: You and/or your care partner have signed paperwork which will be entered into your electronic medical record.  These signatures attest to the fact that that the information above on your After Visit Summary has been reviewed and is understood.  Full responsibility of the confidentiality of this discharge information lies with you and/or your care-partner. 

## 2020-02-24 NOTE — Progress Notes (Signed)
Pt's states no medical or surgical changes since previsit or office visit.  Vs -CW 

## 2020-02-24 NOTE — Op Note (Signed)
[facility] Patient Name: [name] [name] Procedure Date: 02/24/2020 10:52 AM MRN: [id number] Endoscopist: Napoleon Form , MD Age: 29 Referring MD:  Date of Birth: 1991/06/24 Gender: Female Account #: [id number] Procedure:                Upper GI endoscopy Indications:              Epigastric abdominal pain Medicines:                Monitored Anesthesia Care Procedure:                Pre-Anesthesia Assessment:                           - Prior to the procedure, a History and Physical                            was performed, and patient medications and                            allergies were reviewed. The patient's tolerance of                            previous anesthesia was also reviewed. The risks                            and benefits of the procedure and the sedation                            options and risks were discussed with the patient.                            All questions were answered, and informed consent                            was obtained. Prior Anticoagulants: The patient has                            taken no previous anticoagulant or antiplatelet                            agents. ASA Grade Assessment: II - A patient with                            mild systemic disease. After reviewing the risks                            and benefits, the patient was deemed in                            satisfactory condition to undergo the procedure.                           After obtaining informed consent, the endoscope was  passed under direct vision. Throughout the                            procedure, the patient's blood pressure, pulse, and                            oxygen saturations were monitored continuously. The                            Endoscope was introduced through the mouth, and                            advanced to the second part of duodenum. The upper                            GI endoscopy was  accomplished without difficulty.                            The patient tolerated the procedure well. Scope In: Scope Out: Findings:                 The examined esophagus was normal.                           The gastroesophageal flap valve was visualized                            endoscopically and classified as Hill Grade III                            (minimal fold, loose to endoscope, hiatal hernia                            likely).                           The Z-line was regular and was found 35 cm from the                            incisors.                           Patchy mild inflammation characterized by adherent                            blood, congestion (edema) and erythema was found in                            the entire examined stomach. Biopsies were taken                            with a cold forceps for Helicobacter pylori testing.                           Diffuse mucosal flattening with patchy erythema was  found in the first portion of the duodenum and in                            the second portion of the duodenum. Biopsies for                            histology were taken with a cold forceps for                            evaluation of celiac disease. Complications:            No immediate complications. Estimated Blood Loss:     Estimated blood loss was minimal. Impression:               - Normal esophagus.                           - Gastroesophageal flap valve classified as Hill                            Grade III (minimal fold, loose to endoscope, hiatal                            hernia likely).                           - Z-line regular, 35 cm from the incisors.                           - Gastritis. Biopsied.                           - Flattened mucosa was found in the duodenum, rule                            out celiac disease. Biopsied. Recommendation:           - Patient has a contact number available for                             emergencies. The signs and symptoms of potential                            delayed complications were discussed with the                            patient. Return to normal activities tomorrow.                            Written discharge instructions were provided to the                            patient.                           - Resume previous diet.                           -  Continue present medications.                           - Await pathology results. Napoleon Form, MD 02/24/2020 11:17:10 AM This report has been signed electronically.

## 2020-02-24 NOTE — Progress Notes (Signed)
Called to room to assist during endoscopic procedure.  Patient ID and intended procedure confirmed with present staff. Received instructions for my participation in the procedure from the performing physician.  

## 2020-02-28 ENCOUNTER — Telehealth: Payer: Self-pay | Admitting: Nurse Practitioner

## 2020-02-28 ENCOUNTER — Other Ambulatory Visit: Payer: Self-pay | Admitting: Nurse Practitioner

## 2020-02-28 MED ORDER — ONDANSETRON HCL 4 MG PO TABS: 4.0000 mg | ORAL_TABLET | Freq: Three times a day (TID) | ORAL | 1 refills | Status: AC | PRN

## 2020-02-28 NOTE — Telephone Encounter (Signed)
  EGD 02/24/20  -Normal esophagus. - Gastroesophageal flap valve classified as Hill Grade III (minimal fold, loose to endoscope, hiatal hernia likely). - Z-line regular, 35 cm from the incisors. - Gastritis. Biopsied. - Flattened mucosa was found in the duodenum, rule out celiac disease. Biopsied.   Dr. Elana Alm / Shanda Bumps Zehr P.A patient undergoing workup for abdominal pain, nausea and vomiting. CT scan / labs unrevealing except for anemia.   Called answering service this am. Vomiting x 3 days. Can't hold down food but tolerating some liquid PO. Taking Phenergan 100 mg Q two hours. Not vomiting the medication.  Having upper abdominal pain, same pain as having before EGD. Wants something for pain .   Recommendations:   Decrease phenergan to no more than 50 mg Q 6-8 hours. Currently taking excessive amount. Declined suppositories  Will call in Zofran 4mg  Q 8 hours to alternate with Phenergan.   If unable to tolerate liquids then to ED for IVF  We do not prescribe pain medications over the phone on weekends / holidays.   Will ask Beth RN to call patient tomorrow and get condition update. I will call to to primary GI Dr. .

## 2020-02-29 ENCOUNTER — Telehealth: Payer: Self-pay

## 2020-02-29 ENCOUNTER — Telehealth: Payer: Self-pay | Admitting: Gastroenterology

## 2020-02-29 NOTE — Telephone Encounter (Signed)
Patient calling states she is in a lot of pain requesting medication to help her.

## 2020-02-29 NOTE — Telephone Encounter (Signed)
Called the patient for follow up. She is currently in the ED for evaluation.

## 2020-02-29 NOTE — Telephone Encounter (Signed)
  Follow up Call-  Call back number 02/24/2020  Post procedure Call Back phone  # [phone]  Permission to leave phone message Yes  Some recent data might be hidden     Patient questions:  Do you have a fever, pain , or abdominal swelling? No. Pain Score  0 *  Have you tolerated food without any problems? No Throwing up started Saturday.  And hasn't been able to keep anything down since.  York Spaniel she was having pain Friday, but that went away that day.  Watery and mucousy throw up is how she describes it.  Have you been able to return to your normal activities? Yes.    Do you have any questions about your discharge instructions: Diet   No. Medications  No. Follow up visit  No.  Do you have questions or concerns about your Care? No.  Actions: * If pain score is 4 or above: No action needed, pain <4.  1. Have you developed a fever since your procedure? No  2.   Have you had an respiratory symptoms (SOB or cough) since your procedure? No 3.   Have you tested positive for COVID 19 since your procedure No  4.   Have you had any family members/close contacts diagnosed with the COVID 19 since your procedure? No  If yes to any of these questions please route to Laverna Peace, RN and Charlett Lango, RN

## 2020-02-29 NOTE — Telephone Encounter (Signed)
Spoke with the patient. She is receiving fluids in ER. Feeling a little better. Has had an u/s and she waiting for the resul;ts.

## 2020-03-04 DIAGNOSIS — N920 Excessive and frequent menstruation with regular cycle: Secondary | ICD-10-CM | POA: Insufficient documentation

## 2020-03-04 DIAGNOSIS — K2950 Unspecified chronic gastritis without bleeding: Secondary | ICD-10-CM | POA: Insufficient documentation

## 2020-03-04 DIAGNOSIS — E876 Hypokalemia: Secondary | ICD-10-CM | POA: Insufficient documentation

## 2020-03-04 DIAGNOSIS — K529 Noninfective gastroenteritis and colitis, unspecified: Secondary | ICD-10-CM | POA: Insufficient documentation

## 2020-03-06 ENCOUNTER — Encounter: Payer: Self-pay | Admitting: Gastroenterology

## 2020-03-06 NOTE — Telephone Encounter (Signed)
Patient admitted to [facility] ER, treated with IV fluids and antiemetics.  Functional dyspepsia versus acute gastroenteritis.

## 2020-03-07 ENCOUNTER — Encounter: Payer: Self-pay | Admitting: Gastroenterology

## 2020-05-16 ENCOUNTER — Telehealth: Payer: Self-pay | Admitting: Gastroenterology

## 2020-05-16 DIAGNOSIS — R1013 Epigastric pain: Secondary | ICD-10-CM

## 2020-05-16 DIAGNOSIS — R112 Nausea with vomiting, unspecified: Secondary | ICD-10-CM

## 2020-05-16 NOTE — Telephone Encounter (Signed)
Dr. Lavon Paganini, patient is requesting a refill of promethazine. Can we refill?

## 2020-05-19 NOTE — Telephone Encounter (Signed)
Ok to refill. Thanks 

## 2020-05-22 MED ORDER — PROMETHAZINE HCL 25 MG PO TABS: ORAL_TABLET | ORAL | 0 refills | Status: DC

## 2020-05-22 NOTE — Telephone Encounter (Signed)
Prescription refill sent to pharmacy

## 2020-05-23 ENCOUNTER — Telehealth: Payer: Self-pay | Admitting: Gastroenterology

## 2020-05-23 NOTE — Telephone Encounter (Signed)
Received page to on-call. Patient called requesting RF of promethazine. Chart reviewed, and that same Rx was sent in yesterday by Dr. Lavon Paganini to [facility] on [address]. Informed patient of this and she will head there to pick up. No new Rx provided this evening.

## 2020-05-24 ENCOUNTER — Telehealth: Payer: Self-pay | Admitting: Gastroenterology

## 2020-05-24 NOTE — Telephone Encounter (Signed)
Spoke with the patient. She states "same thing as before." She reports a week of feeling poorly and nauseated. She was uninterested in food. Yesterday she began vomiting. Her stomach hurts. Feels tired. Trying to drink fluids for hydration. Hot and cold flashes but afebrile. She will get someone to pick up the Rx for promethazine. Denies known COVID exposure. Has not had vaccines.

## 2020-05-25 NOTE — Telephone Encounter (Signed)
The pt has been advised to get COVID tested and push fluids.  She was told if she is unable to tolerate fluids she should go to the ED for eval. She will call back to make an appt after COVID testing results have been completed.

## 2020-05-25 NOTE — Telephone Encounter (Signed)
Ok, she should get tested for Covid infection to exclude. Please advise patient to maintain hydration but if unable to tolerate any PO intake, will need to come to ER for IV fluids. Please schedule office follow up visit, next available appt with me or APP. Thanks

## 2020-06-01 ENCOUNTER — Ambulatory Visit: Payer: Self-pay | Admitting: Gastroenterology

## 2020-07-25 ENCOUNTER — Telehealth: Payer: Self-pay | Admitting: Gastroenterology

## 2020-07-25 DIAGNOSIS — R1013 Epigastric pain: Secondary | ICD-10-CM

## 2020-07-25 DIAGNOSIS — R112 Nausea with vomiting, unspecified: Secondary | ICD-10-CM

## 2020-07-25 NOTE — Telephone Encounter (Signed)
Dr Lavon Paganini, is it ok for me to send her in Phenergan?

## 2020-07-25 NOTE — Telephone Encounter (Signed)
Pt is requesting a refill on her Phenergan, Pharmacy verified [facility] [address]

## 2020-07-26 MED ORDER — PROMETHAZINE HCL 25 MG PO TABS: ORAL_TABLET | ORAL | 0 refills | Status: DC

## 2020-07-26 NOTE — Telephone Encounter (Signed)
Okay to send refill, please schedule her for follow-up visit either with me or app next available appointment to evaluate her persistent symptoms

## 2020-07-26 NOTE — Telephone Encounter (Signed)
Refilled phenergan and scheduled pt for an office appointment on 09/12/2020 at 10:40am , Called pt to inform but phone went to a busy signal

## 2020-09-09 ENCOUNTER — Telehealth: Payer: Self-pay | Admitting: Gastroenterology

## 2020-09-09 DIAGNOSIS — R1013 Epigastric pain: Secondary | ICD-10-CM

## 2020-09-09 DIAGNOSIS — R112 Nausea with vomiting, unspecified: Secondary | ICD-10-CM

## 2020-09-09 MED ORDER — PROMETHAZINE HCL 25 MG PO TABS: ORAL_TABLET | ORAL | 0 refills | Status: DC

## 2020-09-09 NOTE — Telephone Encounter (Signed)
She called with n/vomiting again, asking for phenergan refill. Looks like this has been called in several times in the past. She has appt next week with Dr. Lavon Paganini.  I refilled her phenergan 25mg  pills, 30, no refills.

## 2020-09-12 ENCOUNTER — Ambulatory Visit: Payer: Self-pay | Admitting: Gastroenterology

## 2020-11-17 ENCOUNTER — Telehealth: Payer: Self-pay | Admitting: Gastroenterology

## 2020-11-17 NOTE — Telephone Encounter (Signed)
Pt called requesting rf for Phenergan and medication for abd pain. She stated that she still has the same abd pain. I advised pt that she needs to be seen but she stated that she is uninsured and cannot afford to pay out of packet, that 's why she has not been able to come to her appts. She uses [facility] on [address]

## 2020-11-20 NOTE — Telephone Encounter (Signed)
Unable to reach pt, received message that voice mailbox is full and cannot accept messages.

## 2020-11-20 NOTE — Telephone Encounter (Signed)
Please see below and advise if ok to refill phenergan.

## 2020-11-20 NOTE — Telephone Encounter (Signed)
Unfortunately we cannot send refill without office visit.

## 2020-11-22 NOTE — Telephone Encounter (Signed)
Attempted to reach the patient.  A female picked up and when I asked to speak with Leeann he hung up the phone.  I called back and no answer and unable to leave a message.  I will await a return call from the patient.

## 2021-03-06 ENCOUNTER — Other Ambulatory Visit: Payer: Self-pay

## 2021-03-06 ENCOUNTER — Encounter (HOSPITAL_COMMUNITY): Payer: Self-pay

## 2021-03-06 ENCOUNTER — Ambulatory Visit (HOSPITAL_COMMUNITY)
Admission: EM | Admit: 2021-03-06 | Discharge: 2021-03-06 | Disposition: A | Discharge status: Home / Self Care | Payer: Self-pay | Attending: Internal Medicine | Admitting: Internal Medicine

## 2021-03-06 DIAGNOSIS — J020 Streptococcal pharyngitis: Secondary | ICD-10-CM

## 2021-03-06 DIAGNOSIS — Z20822 Contact with and (suspected) exposure to covid-19: Secondary | ICD-10-CM | POA: Insufficient documentation

## 2021-03-06 DIAGNOSIS — J0300 Acute streptococcal tonsillitis, unspecified: Secondary | ICD-10-CM | POA: Insufficient documentation

## 2021-03-06 LAB — POCT RAPID STREP A, ED / UC: Streptococcus, Group A Screen (Direct): POSITIVE — AB

## 2021-03-06 LAB — POC INFLUENZA A AND B ANTIGEN (URGENT CARE ONLY)
INFLUENZA A ANTIGEN, POC: NEGATIVE
INFLUENZA B ANTIGEN, POC: NEGATIVE

## 2021-03-06 LAB — SARS CORONAVIRUS 2 (TAT 6-24 HRS): SARS Coronavirus 2: NEGATIVE

## 2021-03-06 MED ORDER — AMOXICILLIN 875 MG PO TABS: 875.0000 mg | ORAL_TABLET | Freq: Two times a day (BID) | ORAL | 0 refills | Status: AC

## 2021-03-06 MED ORDER — PREDNISONE 20 MG PO TABS: 40.0000 mg | ORAL_TABLET | Freq: Every day | ORAL | 0 refills | Status: AC

## 2021-03-06 NOTE — ED Triage Notes (Signed)
Pt presents with c/o sore throat and body aches X 3 days.   States she has been feeling very tired.  States she started coughing yesterday.

## 2021-03-06 NOTE — Discharge Instructions (Addendum)
You are being treated for acute tonsillitis due to strep throat with amoxicillin antibiotic.  You may also take over-the-counter cough and cold medications as needed.  You may also try Cepacol throat lozenges over-the-counter for sore throat.  Honey Tea For cough/sore throat try using a honey-based tea. Use 3 teaspoons of honey with juice squeezed from half lemon. Place shaved pieces of ginger into 1/2-1 cup of water and warm over stove top. Then mix the ingredients and repeat every 4 hours as needed.

## 2021-03-06 NOTE — ED Provider Notes (Signed)
[facility]    CSN: [id number] Arrival date & time: 03/06/21  0845      History   Chief Complaint Chief Complaint  Patient presents with   Sore Throat   Generalized Body Aches    HPI [name] [name] is a 30 y.o. female.   Patient presents with 3-day history of sore throat, body aches, cough, fatigue.  Patient states that she had a temp max of 100.3 at home.  Patient states that she has not yet taken any over-the-counter medications to relieve any symptoms.  Denies any sick contacts.  Denies shortness of breath.   Sore Throat   Past Medical History:  Diagnosis Date   Allergy    Anemia    Chlamydia    06/15/14 w/tx, 10/11/14 w/tx, TOC-neg x2   Eczema    GERD (gastroesophageal reflux disease)    Gonorrhea 06/15/14   w/tx, TOC- neg   History of anemia    Infection    chlamydia & gonorrhea   Trichomonas     Patient Active Problem List   Diagnosis Date Noted   Chronic gastritis without bleeding 03/04/2020   Colitis 03/04/2020   Hypokalemia 03/04/2020   Menorrhagia with regular cycle 03/04/2020   Nausea and vomiting 12/31/2019   Epigastric pain 12/31/2019   Iron deficiency anemia 12/31/2019   Encounter for female sterilization procedure     Past Surgical History:  Procedure Laterality Date   fractured finger as a child     LAPAROSCOPIC TUBAL LIGATION Bilateral 03/16/2015   Procedure: LAPAROSCOPIC TUBAL LIGATION WITH FILSHIE CLIPS;  Surgeon: Tereso Newcomer, MD;  Location: [facility];  Service: Gynecology;  Laterality: Bilateral;  With Filshie clips  Requested 03/16/15 @ 1:00p   UPPER GASTROINTESTINAL ENDOSCOPY  02/24/2020   WISDOM TOOTH EXTRACTION      OB History     Gravida  5   Para  4   Term  4   Preterm      AB  1   Living  4      SAB  1   IAB      Ectopic      Multiple  0   Live Births  4            Home Medications    Prior to Admission medications   Medication Sig Start Date End Date Taking? Authorizing  Provider  amoxicillin (AMOXIL) 875 MG tablet Take 1 tablet (875 mg total) by mouth 2 (two) times daily for 10 days. 03/06/21 03/16/21 Yes Lance Muss, FNP  predniSONE (DELTASONE) 20 MG tablet Take 2 tablets (40 mg total) by mouth daily for 5 days. 03/06/21 03/11/21 Yes Lance Muss, FNP  ferrous sulfate 325 (65 FE) MG EC tablet Take 1 tablet (325 mg total) by mouth at bedtime. Patient not taking: Reported on 02/24/2020 12/31/19   Zehr, Princella Pellegrini, PA-C  hyoscyamine (LEVSIN SL) 0.125 MG SL tablet Place 1 tablet (0.125 mg total) under the tongue every 12 (twelve) hours as needed for cramping. 02/24/20   Napoleon Form, MD  omeprazole (PRILOSEC) 20 MG capsule Take 1 capsule (20 mg total) by mouth daily. 01/21/20   Zehr, Princella Pellegrini, PA-C  promethazine (PHENERGAN) 25 MG tablet TAKE 1 TABLET(25 MG) BY MOUTH EVERY 6 HOURS AS NEEDED FOR NAUSEA OR VOMITING 09/09/20   Rachael Fee, MD    Family History Family History  Problem Relation Age of Onset   Asthma Sister    Heart disease Maternal  Grandmother    Other Neg Hx    Colon cancer Neg Hx    Colon polyps Neg Hx    Esophageal cancer Neg Hx    Rectal cancer Neg Hx    Stomach cancer Neg Hx     Social History Social History   Tobacco Use   Smoking status: Never   Smokeless tobacco: Never  Vaping Use   Vaping Use: Never used  Substance Use Topics   Alcohol use: No   Drug use: No     Allergies   Patient has no known allergies.   Review of Systems Review of Systems Per HPI  Physical Exam Triage Vital Signs ED Triage Vitals  Enc Vitals Group     BP 03/06/21 1000 127/74     Pulse Rate 03/06/21 1000 (!) 103     Resp 03/06/21 1000 18     Temp 03/06/21 1000 98.7 F (37.1 C)     Temp Source 03/06/21 1000 Oral     SpO2 03/06/21 1000 98 %     Weight --      Height --      Head Circumference --      Peak Flow --      Pain Score 03/06/21 0958 10     Pain Loc --      Pain Edu? --      Excl. in GC? --    No data  found.  Updated Vital Signs BP 127/74 (BP Location: Right Arm)   Pulse (!) 103   Temp 98.7 F (37.1 C) (Oral)   Resp 18   LMP 02/25/2021 (Exact Date)   SpO2 98%   Visual Acuity Right Eye Distance:   Left Eye Distance:   Bilateral Distance:    Right Eye Near:   Left Eye Near:    Bilateral Near:     Physical Exam Constitutional:      General: She is not in acute distress.    Appearance: Normal appearance.  HENT:     Head: Normocephalic and atraumatic.     Right Ear: Tympanic membrane and ear canal normal.     Left Ear: Tympanic membrane and ear canal normal.     Nose: Rhinorrhea present. Rhinorrhea is clear.     Mouth/Throat:     Mouth: Mucous membranes are moist.     Pharynx: Posterior oropharyngeal erythema present.     Tonsils: 3+ on the right. 3+ on the left.  Eyes:     Extraocular Movements: Extraocular movements intact.     Conjunctiva/sclera: Conjunctivae normal.     Pupils: Pupils are equal, round, and reactive to light.  Cardiovascular:     Rate and Rhythm: Normal rate and regular rhythm.     Pulses: Normal pulses.     Heart sounds: Normal heart sounds.  Pulmonary:     Effort: Pulmonary effort is normal. No respiratory distress.     Breath sounds: Normal breath sounds. No wheezing.  Abdominal:     General: Abdomen is flat. Bowel sounds are normal.     Palpations: Abdomen is soft.  Musculoskeletal:        General: Normal range of motion.     Cervical back: Normal range of motion.  Skin:    General: Skin is warm and dry.  Neurological:     General: No focal deficit present.     Mental Status: She is alert and oriented to person, place, and time. Mental status is at baseline.  Psychiatric:  Mood and Affect: Mood normal.        Behavior: Behavior normal.        Thought Content: Thought content normal.        Judgment: Judgment normal.     UC Treatments / Results  Labs (all labs ordered are listed, but only abnormal results are displayed) Labs  Reviewed  POCT RAPID STREP A, ED / UC - Abnormal; Notable for the following components:      Result Value   Streptococcus, Group A Screen (Direct) POSITIVE (*)    All other components within normal limits  CULTURE, GROUP A STREP ([facility])  SARS CORONAVIRUS 2 (TAT 6-24 HRS)  POC INFLUENZA A AND B ANTIGEN (URGENT CARE ONLY)    EKG   Radiology No results found.  Procedures Procedures (including critical care time)  Medications Ordered in UC Medications - No data to display  Initial Impression / Assessment and Plan / UC Course  I have reviewed the triage vital signs and the nursing notes.  Pertinent labs & imaging results that were available during my care of the patient were reviewed by me and considered in my medical decision making (see chart for details).     Patient tested positive for strep throat in urgent care today.  Will treat with amoxicillin x10 days.  Patient also has acute tonsillitis. Will treat with prednisone x5 days to decrease inflammation of tonsils.  Rapid flu was negative in urgent care.  COVID-19 viral swab pending.  Discussed over-the-counter options for relief of sore throat.  Also discussed over-the-counter options for cough.  Fever monitoring and management discussed with patient and advised patient to take Tylenol as needed for fever.  Discussed strict return precautions. Patient verbalized understanding and is agreeable with plan.  Final Clinical Impressions(s) / UC Diagnoses   Final diagnoses:  Strep pharyngitis  Acute non-recurrent streptococcal tonsillitis     Discharge Instructions      You are being treated for acute tonsillitis due to strep throat with amoxicillin antibiotic.  You may also take over-the-counter cough and cold medications as needed.  You may also try Cepacol throat lozenges over-the-counter for sore throat.  Honey Tea For cough/sore throat try using a honey-based tea. Use 3 teaspoons of honey with juice squeezed from half lemon.  Place shaved pieces of ginger into 1/2-1 cup of water and warm over stove top. Then mix the ingredients and repeat every 4 hours as needed.      ED Prescriptions     Medication Sig Dispense Auth. Provider   amoxicillin (AMOXIL) 875 MG tablet Take 1 tablet (875 mg total) by mouth 2 (two) times daily for 10 days. 20 tablet Lance Muss, FNP   predniSONE (DELTASONE) 20 MG tablet Take 2 tablets (40 mg total) by mouth daily for 5 days. 10 tablet Lance Muss, FNP      PDMP not reviewed this encounter.   Lance Muss, FNP 03/06/21 1105

## 2021-03-15 ENCOUNTER — Ambulatory Visit (HOSPITAL_COMMUNITY)
Admission: EM | Admit: 2021-03-15 | Discharge: 2021-03-15 | Disposition: A | Discharge status: Home / Self Care | Payer: Self-pay | Attending: Family Medicine | Admitting: Family Medicine

## 2021-03-15 ENCOUNTER — Other Ambulatory Visit: Payer: Self-pay

## 2021-03-15 ENCOUNTER — Encounter (HOSPITAL_COMMUNITY): Payer: Self-pay

## 2021-03-15 DIAGNOSIS — Z7952 Long term (current) use of systemic steroids: Secondary | ICD-10-CM | POA: Insufficient documentation

## 2021-03-15 DIAGNOSIS — Z20822 Contact with and (suspected) exposure to covid-19: Secondary | ICD-10-CM | POA: Insufficient documentation

## 2021-03-15 DIAGNOSIS — J029 Acute pharyngitis, unspecified: Secondary | ICD-10-CM | POA: Insufficient documentation

## 2021-03-15 DIAGNOSIS — N760 Acute vaginitis: Secondary | ICD-10-CM | POA: Insufficient documentation

## 2021-03-15 DIAGNOSIS — J069 Acute upper respiratory infection, unspecified: Secondary | ICD-10-CM

## 2021-03-15 DIAGNOSIS — Z79899 Other long term (current) drug therapy: Secondary | ICD-10-CM | POA: Insufficient documentation

## 2021-03-15 LAB — SARS CORONAVIRUS 2 (TAT 6-24 HRS): SARS Coronavirus 2: NEGATIVE

## 2021-03-15 MED ORDER — LIDOCAINE VISCOUS HCL 2 % MT SOLN: 10.0000 mL | OROMUCOSAL | 0 refills | Status: AC | PRN

## 2021-03-15 MED ORDER — FLUCONAZOLE 150 MG PO TABS: 150.0000 mg | ORAL_TABLET | ORAL | 0 refills | Status: DC

## 2021-03-15 MED ORDER — PREDNISONE 50 MG PO TABS: ORAL_TABLET | ORAL | 0 refills | Status: DC

## 2021-03-15 NOTE — ED Provider Notes (Signed)
[facility]    CSN: [id number] Arrival date & time: 03/15/21  3244      History   Chief Complaint Chief Complaint  Patient presents with   Sore Throat   Vaginal Discharge    HPI [name] [name] is a 30 y.o. female.   Patient presenting today with about 3-day history of congestion, cough, sore throat.  Was diagnosed with strep pharyngitis about a week ago, started on amoxicillin which she has taken faithfully.  States this has not improved her sore throat significantly but is not having any fevers, difficulty swallowing or breathing, chest pain, shortness of breath, abdominal pain, nausea vomiting or diarrhea.  She thinks that the antibiotic may have given her a yeast infection and she is now having white discharge and vaginal irritation and itching.  Not trying anything over-the-counter for symptoms at this time.  Son sick with similar symptoms.   Past Medical History:  Diagnosis Date   Allergy    Anemia    Chlamydia    06/15/14 w/tx, 10/11/14 w/tx, TOC-neg x2   Eczema    GERD (gastroesophageal reflux disease)    Gonorrhea 06/15/14   w/tx, TOC- neg   History of anemia    Infection    chlamydia & gonorrhea   Trichomonas     Patient Active Problem List   Diagnosis Date Noted   Chronic gastritis without bleeding 03/04/2020   Colitis 03/04/2020   Hypokalemia 03/04/2020   Menorrhagia with regular cycle 03/04/2020   Nausea and vomiting 12/31/2019   Epigastric pain 12/31/2019   Iron deficiency anemia 12/31/2019   Encounter for female sterilization procedure     Past Surgical History:  Procedure Laterality Date   fractured finger as a child     LAPAROSCOPIC TUBAL LIGATION Bilateral 03/16/2015   Procedure: LAPAROSCOPIC TUBAL LIGATION WITH FILSHIE CLIPS;  Surgeon: Tereso Newcomer, MD;  Location: [facility];  Service: Gynecology;  Laterality: Bilateral;  With Filshie clips  Requested 03/16/15 @ 1:00p   UPPER GASTROINTESTINAL ENDOSCOPY  02/24/2020   WISDOM TOOTH  EXTRACTION      OB History     Gravida  5   Para  4   Term  4   Preterm      AB  1   Living  4      SAB  1   IAB      Ectopic      Multiple  0   Live Births  4            Home Medications    Prior to Admission medications   Medication Sig Start Date End Date Taking? Authorizing Provider  fluconazole (DIFLUCAN) 150 MG tablet Take 1 tablet (150 mg total) by mouth every other day. 03/15/21  Yes Particia Nearing, PA-C  lidocaine (XYLOCAINE) 2 % solution Use as directed 10 mLs in the mouth or throat as needed for mouth pain. 03/15/21  Yes Particia Nearing, PA-C  predniSONE (DELTASONE) 50 MG tablet 1 tablet daily with breakfast for 3 days 03/15/21  Yes Particia Nearing, PA-C  amoxicillin (AMOXIL) 875 MG tablet Take 1 tablet (875 mg total) by mouth 2 (two) times daily for 10 days. 03/06/21 03/16/21  Lance Muss, FNP  ferrous sulfate 325 (65 FE) MG EC tablet Take 1 tablet (325 mg total) by mouth at bedtime. Patient not taking: Reported on 02/24/2020 12/31/19   Zehr, Princella Pellegrini, PA-C  hyoscyamine (LEVSIN SL) 0.125 MG SL tablet Place 1 tablet (  0.125 mg total) under the tongue every 12 (twelve) hours as needed for cramping. 02/24/20   Napoleon Form, MD  omeprazole (PRILOSEC) 20 MG capsule Take 1 capsule (20 mg total) by mouth daily. 01/21/20   Zehr, Princella Pellegrini, PA-C  promethazine (PHENERGAN) 25 MG tablet TAKE 1 TABLET(25 MG) BY MOUTH EVERY 6 HOURS AS NEEDED FOR NAUSEA OR VOMITING 09/09/20   Rachael Fee, MD    Family History Family History  Problem Relation Age of Onset   Asthma Sister    Heart disease Maternal Grandmother    Other Neg Hx    Colon cancer Neg Hx    Colon polyps Neg Hx    Esophageal cancer Neg Hx    Rectal cancer Neg Hx    Stomach cancer Neg Hx     Social History Social History   Tobacco Use   Smoking status: Never   Smokeless tobacco: Never  Vaping Use   Vaping Use: Never used  Substance Use Topics   Alcohol use: No   Drug  use: No     Allergies   Patient has no known allergies.   Review of Systems Review of Systems Per HPI  Physical Exam Triage Vital Signs ED Triage Vitals  Enc Vitals Group     BP 03/15/21 0937 121/87     Pulse Rate 03/15/21 0937 94     Resp 03/15/21 0937 19     Temp 03/15/21 0937 99.1 F (37.3 C)     Temp Source 03/15/21 0937 Oral     SpO2 03/15/21 0937 97 %     Weight --      Height --      Head Circumference --      Peak Flow --      Pain Score 03/15/21 0936 0     Pain Loc --      Pain Edu? --      Excl. in GC? --    No data found.  Updated Vital Signs BP 121/87 (BP Location: Left Arm)   Pulse 94   Temp 99.1 F (37.3 C) (Oral)   Resp 19   LMP 02/25/2021 (Exact Date)   SpO2 97%   Visual Acuity Right Eye Distance:   Left Eye Distance:   Bilateral Distance:    Right Eye Near:   Left Eye Near:    Bilateral Near:     Physical Exam Vitals and nursing note reviewed.  Constitutional:      Appearance: Normal appearance. She is not ill-appearing.  HENT:     Head: Atraumatic.     Mouth/Throat:     Mouth: Mucous membranes are moist.     Pharynx: Posterior oropharyngeal erythema present. No oropharyngeal exudate.     Comments: No tonsillar edema or exudates bilaterally, uvula midline, oral airway patent Eyes:     Extraocular Movements: Extraocular movements intact.     Conjunctiva/sclera: Conjunctivae normal.  Cardiovascular:     Rate and Rhythm: Normal rate and regular rhythm.     Heart sounds: Normal heart sounds.  Pulmonary:     Effort: Pulmonary effort is normal. No respiratory distress.     Breath sounds: Normal breath sounds. No wheezing or rales.  Genitourinary:    Comments: Exam deferred Musculoskeletal:        General: Normal range of motion.     Cervical back: Normal range of motion and neck supple.  Skin:    General: Skin is warm and dry.  Neurological:  Mental Status: She is alert and oriented to person, place, and time.  Psychiatric:         Mood and Affect: Mood normal.        Thought Content: Thought content normal.        Judgment: Judgment normal.   UC Treatments / Results  Labs (all labs ordered are listed, but only abnormal results are displayed) Labs Reviewed  SARS CORONAVIRUS 2 (TAT 6-24 HRS)    EKG   Radiology No results found.  Procedures Procedures (including critical care time)  Medications Ordered in UC Medications - No data to display  Initial Impression / Assessment and Plan / UC Course  I have reviewed the triage vital signs and the nursing notes.  Pertinent labs & imaging results that were available during my care of the patient were reviewed by me and considered in my medical decision making (see chart for details).     Exam and vitals benign and reassuring today.  Completing antibiotics for recent strep infections, given persistent symptoms will give course of prednisone to see if this helps resolve immediate symptoms.  COVID testing initiated and pending today given new symptoms and sick contact.  Viscous lidocaine sent additionally for symptomatic relief.  Over-the-counter medications and supportive home care reviewed.  Diflucan sent for suspected yeast infection related to antibiotic use.  If not resolving, patient is to return for vaginal swab for further testing.  She declines any desire for STD testing today.  Work note given.  Final Clinical Impressions(s) / UC Diagnoses   Final diagnoses:  Viral URI  Sore throat  Acute vaginitis   Discharge Instructions   None    ED Prescriptions     Medication Sig Dispense Auth. Provider   predniSONE (DELTASONE) 50 MG tablet 1 tablet daily with breakfast for 3 days 3 tablet Particia Nearing, PA-C   lidocaine (XYLOCAINE) 2 % solution Use as directed 10 mLs in the mouth or throat as needed for mouth pain. 100 mL Particia Nearing, PA-C   fluconazole (DIFLUCAN) 150 MG tablet Take 1 tablet (150 mg total) by mouth every other day.  3 tablet Particia Nearing, [place]      PDMP not reviewed this encounter.   Particia Nearing, [place] 03/15/21 1040

## 2021-03-15 NOTE — ED Triage Notes (Signed)
Pt presents with a sore throat.   States her throat hurts when she coughs. Pt expresses concerns for possible yeast infection due to vaginal discharge and irritation x 1 week.

## 2022-04-18 IMAGING — NM NM HEPATO W/GB/PHARM/[PERSON_NAME]
1 series · 6 of 6 positions shown · non-contrast
Comparison: None

CLINICAL DATA: Epigastric pain, nausea, and vomiting

EXAM:
NUCLEAR MEDICINE HEPATOBILIARY IMAGING WITH GALLBLADDER EF
TECHNIQUE: Sequential images of the abdomen were obtained [DATE] minutes
following intravenous administration of radiopharmaceutical. After
oral ingestion of Ensure, gallbladder ejection fraction was
determined. At 60 min, normal ejection fraction is greater than 33%.
RADIOPHARMACEUTICALS:  5.5 mCi [id number]  Choletec IV

[Series 1: dynamic - (id) - gbef - hida_motion_corrected moti · 3.25mm/px · 6 of 60 frames shown]
[frame 6/60]
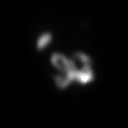
[frame 16/60]
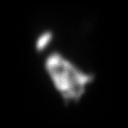
[frame 26/60]
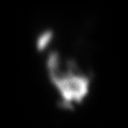
[frame 36/60]
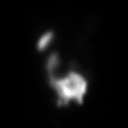
[frame 46/60]
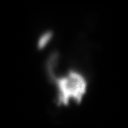
[frame 56/60]
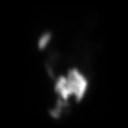

[6 of 6 positions shown; findings below may reference images not displayed]

FINDINGS: Normal tracer extraction from bloodstream indicating normal
hepatocellular function.

Normal excretion of tracer into biliary tree.

Gallbladder visualized at 6 min.

Small bowel visualized at 5 min.

No hepatic retention of tracer.

Subjectively normal emptying of tracer from gallbladder following
fatty meal stimulation.

Calculated gallbladder ejection fraction is 43%, normal.

Patient reported abdominal pain following Ensure ingestion.

Patient motion artifacts throughout exam.

Normal gallbladder ejection fraction following Ensure ingestion is
greater than 33% at 1 hour.
IMPRESSION: Patent biliary tree with normal gallbladder ejection fraction of 43%
following fatty meal stimulation.

## 2022-11-26 ENCOUNTER — Emergency Department (HOSPITAL_COMMUNITY)
Admission: EM | Admit: 2022-11-26 | Discharge: 2022-11-26 | Disposition: A | Discharge status: Home / Self Care | Payer: Self-pay | Attending: Emergency Medicine | Admitting: Emergency Medicine

## 2022-11-26 ENCOUNTER — Emergency Department (HOSPITAL_COMMUNITY): Payer: Self-pay

## 2022-11-26 ENCOUNTER — Other Ambulatory Visit: Payer: Self-pay

## 2022-11-26 ENCOUNTER — Encounter (HOSPITAL_COMMUNITY): Payer: Self-pay

## 2022-11-26 DIAGNOSIS — R112 Nausea with vomiting, unspecified: Secondary | ICD-10-CM | POA: Insufficient documentation

## 2022-11-26 DIAGNOSIS — R1084 Generalized abdominal pain: Secondary | ICD-10-CM | POA: Insufficient documentation

## 2022-11-26 LAB — COMPREHENSIVE METABOLIC PANEL
ALT: 32 U/L (ref 0–44)
AST: 25 U/L (ref 15–41)
Albumin: 4.3 g/dL (ref 3.5–5.0)
Alkaline Phosphatase: 67 U/L (ref 38–126)
Anion gap: 13 (ref 5–15)
BUN: 12 mg/dL (ref 6–20)
CO2: 23 mmol/L (ref 22–32)
Calcium: 9.8 mg/dL (ref 8.9–10.3)
Chloride: 102 mmol/L (ref 98–111)
Creatinine, Ser: 0.72 mg/dL (ref 0.44–1.00)
GFR, Estimated: >60 mL/min (ref >60)
Glucose, Bld: 119 mg/dL — ABNORMAL HIGH (ref 70–99)
Potassium: 3.7 mmol/L (ref 3.5–5.1)
Sodium: 138 mmol/L (ref 135–145)
Total Bilirubin: 0.6 mg/dL (ref 0.3–1.2)
Total Protein: 8.7 g/dL — ABNORMAL HIGH (ref 6.5–8.1)

## 2022-11-26 LAB — CBC WITH DIFFERENTIAL/PLATELET
Abs Immature Granulocytes: 0.02 10*3/uL (ref 0.00–0.07)
Basophils Absolute: 0 10*3/uL (ref 0.0–0.1)
Basophils Relative: 0 %
Eosinophils Absolute: 0 10*3/uL (ref 0.0–0.5)
Eosinophils Relative: 0 %
HCT: 36.4 % (ref 36.0–46.0)
Hemoglobin: 11.6 g/dL — ABNORMAL LOW (ref 12.0–15.0)
Immature Granulocytes: 0 %
Lymphocytes Relative: 10 %
Lymphs Abs: 1 10*3/uL (ref 0.7–4.0)
MCH: 26.9 pg (ref 26.0–34.0)
MCHC: 31.9 g/dL (ref 30.0–36.0)
MCV: 84.3 fL (ref 80.0–100.0)
Monocytes Absolute: 0.7 10*3/uL (ref 0.1–1.0)
Monocytes Relative: 7 %
Neutro Abs: 8.5 10*3/uL — ABNORMAL HIGH (ref 1.7–7.7)
Neutrophils Relative %: 83 %
Platelets: 316 10*3/uL (ref 150–400)
RBC: 4.32 MIL/uL (ref 3.87–5.11)
RDW: 16 % — ABNORMAL HIGH (ref 11.5–15.5)
WBC: 10.2 10*3/uL (ref 4.0–10.5)
nRBC: 0 % (ref 0.0–0.2)

## 2022-11-26 LAB — I-STAT BETA HCG BLOOD, ED (MC, WL, AP ONLY): I-stat hCG, quantitative: <5 m[IU]/mL (ref <5)

## 2022-11-26 LAB — LIPASE, BLOOD: Lipase: 71 U/L — ABNORMAL HIGH (ref 11–51)

## 2022-11-26 MED ORDER — HYDROMORPHONE HCL 1 MG/ML IJ SOLN
1.0000 mg | Freq: Once | INTRAMUSCULAR | Status: AC
  Administered 2022-11-26: 1 mg via INTRAVENOUS
  Filled 2022-11-26: qty 1

## 2022-11-26 MED ORDER — ONDANSETRON HCL 4 MG/2ML IJ SOLN
4.0000 mg | Freq: Once | INTRAMUSCULAR | Status: AC
  Administered 2022-11-26: 4 mg via INTRAVENOUS
  Filled 2022-11-26: qty 2

## 2022-11-26 MED ORDER — SODIUM CHLORIDE 0.9 % IV BOLUS
1000.0000 mL | Freq: Once | INTRAVENOUS | Status: AC
  Administered 2022-11-26: 1000 mL via INTRAVENOUS

## 2022-11-26 MED ORDER — SODIUM CHLORIDE 0.9 % IV SOLN: INTRAVENOUS | Status: DC

## 2022-11-26 MED ORDER — ONDANSETRON 4 MG PO TBDP: 4.0000 mg | ORAL_TABLET | Freq: Three times a day (TID) | ORAL | 1 refills | Status: DC | PRN

## 2022-11-26 MED ORDER — IOHEXOL 350 MG/ML SOLN
75.0000 mL | Freq: Once | INTRAVENOUS | Status: AC | PRN
  Administered 2022-11-26: 75 mL via INTRAVENOUS

## 2022-11-26 NOTE — ED Provider Notes (Addendum)
[facility] Provider Note   CSN: [id number] Arrival date & time: 11/26/22  0827     History  Chief Complaint  Patient presents with   Abdominal Pain    [name] [name] is a 32 y.o. female.  Patient with generalized abdominal pain mostly epigastric with nausea and vomiting no vomiting of any blood no diarrhea that started Sunday.  Not keeping anything down well.  Patient states she had similar things years ago would like in 2021 but then encounter resolved she did see GI doctor.  They were not able to come up with a cause.  No one else sick at home.  Temp 98.2 heart rate 55 respirations 18 blood pressure 127/58 oxygen saturations at 100%.  Past medical history significant for chlamydia and gonorrhea infections eczema gastroesophageal reflux disease.  Laparoscopic tubal ligation in 2016.  Patient states she is never used tobacco products.       Home Medications Prior to Admission medications   Medication Sig Start Date End Date Taking? Authorizing Provider  ferrous sulfate 325 (65 FE) MG EC tablet Take 1 tablet (325 mg total) by mouth at bedtime. Patient not taking: Reported on 02/24/2020 12/31/19   Zehr, Laban Emperor, PA-C  fluconazole (DIFLUCAN) 150 MG tablet Take 1 tablet (150 mg total) by mouth every other day. 03/15/21   Volney American, PA-C  hyoscyamine (LEVSIN SL) 0.125 MG SL tablet Place 1 tablet (0.125 mg total) under the tongue every 12 (twelve) hours as needed for cramping. 02/24/20   Mauri Pole, MD  lidocaine (XYLOCAINE) 2 % solution Use as directed 10 mLs in the mouth or throat as needed for mouth pain. 03/15/21   Volney American, PA-C  omeprazole (PRILOSEC) 20 MG capsule Take 1 capsule (20 mg total) by mouth daily. 01/21/20   Zehr, Laban Emperor, PA-C  predniSONE (DELTASONE) 50 MG tablet 1 tablet daily with breakfast for 3 days 03/15/21   Volney American, PA-C  promethazine (PHENERGAN) 25 MG tablet TAKE 1  TABLET(25 MG) BY MOUTH EVERY 6 HOURS AS NEEDED FOR NAUSEA OR VOMITING 09/09/20   Milus Banister, MD      Allergies    Patient has no known allergies.    Review of Systems   Review of Systems  Constitutional:  Negative for chills and fever.  HENT:  Negative for ear pain and sore throat.   Eyes:  Negative for pain and visual disturbance.  Respiratory:  Negative for cough and shortness of breath.   Cardiovascular:  Negative for chest pain and palpitations.  Gastrointestinal:  Positive for abdominal pain, nausea and vomiting. Negative for diarrhea.  Genitourinary:  Negative for dysuria and hematuria.  Musculoskeletal:  Negative for arthralgias and back pain.  Skin:  Negative for color change and rash.  Neurological:  Negative for seizures and syncope.  All other systems reviewed and are negative.   Physical Exam Updated Vital Signs BP (!) 116/59   Pulse (!) 55   Temp 98.2 F (36.8 C) (Oral)   Resp 18   Ht 1.651 m (5\' 5" )   Wt 72.6 kg   SpO2 100%   BMI 26.63 kg/m  Physical Exam Vitals and nursing note reviewed.  Constitutional:      General: She is not in acute distress.    Appearance: Normal appearance. She is well-developed.  HENT:     Head: Normocephalic and atraumatic.     Mouth/Throat:     Mouth: Mucous membranes  are dry.  Eyes:     Conjunctiva/sclera: Conjunctivae normal.  Cardiovascular:     Rate and Rhythm: Normal rate and regular rhythm.     Heart sounds: No murmur heard. Pulmonary:     Effort: Pulmonary effort is normal. No respiratory distress.     Breath sounds: Normal breath sounds.  Abdominal:     General: There is no distension.     Palpations: Abdomen is soft.     Tenderness: There is no abdominal tenderness.  Musculoskeletal:        General: No swelling.     Cervical back: Neck supple.  Skin:    General: Skin is warm and dry.     Capillary Refill: Capillary refill takes less than 2 seconds.  Neurological:     General: No focal deficit present.      Mental Status: She is alert and oriented to person, place, and time.  Psychiatric:        Mood and Affect: Mood normal.     ED Results / Procedures / Treatments   Labs (all labs ordered are listed, but only abnormal results are displayed) Labs Reviewed  CBC WITH DIFFERENTIAL/PLATELET  LIPASE, BLOOD  COMPREHENSIVE METABOLIC PANEL  RAPID URINE DRUG SCREEN, HOSP PERFORMED  I-STAT BETA HCG BLOOD, ED ([facility], [facility], [facility] ONLY)    EKG None  Radiology No results found.  Procedures Procedures    Medications Ordered in ED Medications  0.9 %  sodium chloride infusion (has no administration in time range)  sodium chloride 0.9 % bolus 1,000 mL (has no administration in time range)  ondansetron (ZOFRAN) injection 4 mg (has no administration in time range)  HYDROmorphone (DILAUDID) injection 1 mg (has no administration in time range)    ED Course/ Medical Decision Making/ A&P                             Medical Decision Making Amount and/or Complexity of Data Reviewed Labs: ordered. Radiology: ordered.  Risk Prescription drug management.   Will give IV fluids will get labs will get CT scan abdomen.  Will do urine drug screen in case this is cannabis related.  Patient does need pregnancy test.  Will give pain medicine.  CT scan without any acute findings.  CBC no leukocytosis pregnancy test negative lipase 71 complete metabolic panel without any significant electrolyte abnormalities renal function normal with GFR 60 creatinine normal.  No evidence of any significant dehydration despite her history of multiple episodes of vomiting.  CT showed bilateral nonobstructing stones up in the kidneys.  But this should not be causing any of the symptoms.  Patient improved significantly with the fluids antinausea medicine and pain medicine.  Patient wanted something to drink that made her pain worse were redosing her meds another liter of fluid and then she will be discharged.  Patient is  been seen by GI medicine in the past we will have her referred back to them.  Her workup in the past they could not figure out the cause.  Will be discharged home with Zofran.     Final Clinical Impression(s) / ED Diagnoses Final diagnoses:  Generalized abdominal pain  Nausea and vomiting, unspecified vomiting type    Rx / DC Orders ED Discharge Orders     None         Fredia Sorrow, MD 11/26/22 ID:[id number]    Fredia Sorrow, MD 11/26/22 1345

## 2022-11-26 NOTE — ED Triage Notes (Signed)
Pt arrived POV from home c/o generalized abdominal pain and N/V since Sunday.

## 2022-11-26 NOTE — Discharge Instructions (Addendum)
Workup without any significant explanation for the symptoms.  Take the Zofran and ODT.  Also recommend extra strength Tylenol along with it.  Can take Motrin or Naprosyn as needed.  Recommend clear liquids for the next 24 hours and then a bland diet.  Work note provided.

## 2022-11-28 ENCOUNTER — Emergency Department (HOSPITAL_COMMUNITY)
Admission: EM | Admit: 2022-11-28 | Discharge: 2022-11-28 | Disposition: A | Discharge status: Home / Self Care | Payer: Self-pay | Attending: Emergency Medicine | Admitting: Emergency Medicine

## 2022-11-28 ENCOUNTER — Encounter (HOSPITAL_COMMUNITY): Payer: Self-pay

## 2022-11-28 ENCOUNTER — Other Ambulatory Visit: Payer: Self-pay

## 2022-11-28 DIAGNOSIS — R112 Nausea with vomiting, unspecified: Secondary | ICD-10-CM | POA: Insufficient documentation

## 2022-11-28 DIAGNOSIS — R1084 Generalized abdominal pain: Secondary | ICD-10-CM | POA: Insufficient documentation

## 2022-11-28 LAB — URINALYSIS, MICROSCOPIC (REFLEX): RBC / HPF: >50 RBC/hpf (ref 0–5)

## 2022-11-28 LAB — URINALYSIS, ROUTINE W REFLEX MICROSCOPIC

## 2022-11-28 LAB — CBC WITH DIFFERENTIAL/PLATELET
Abs Immature Granulocytes: 0.03 10*3/uL (ref 0.00–0.07)
Basophils Absolute: 0.1 10*3/uL (ref 0.0–0.1)
Basophils Relative: 1 %
Eosinophils Absolute: 0 10*3/uL (ref 0.0–0.5)
Eosinophils Relative: 0 %
HCT: 36.2 % (ref 36.0–46.0)
Hemoglobin: 11.4 g/dL — ABNORMAL LOW (ref 12.0–15.0)
Immature Granulocytes: 0 %
Lymphocytes Relative: 19 %
Lymphs Abs: 1.6 10*3/uL (ref 0.7–4.0)
MCH: 26.9 pg (ref 26.0–34.0)
MCHC: 31.5 g/dL (ref 30.0–36.0)
MCV: 85.4 fL (ref 80.0–100.0)
Monocytes Absolute: 0.6 10*3/uL (ref 0.1–1.0)
Monocytes Relative: 7 %
Neutro Abs: 6.4 10*3/uL (ref 1.7–7.7)
Neutrophils Relative %: 73 %
Platelets: 253 10*3/uL (ref 150–400)
RBC: 4.24 MIL/uL (ref 3.87–5.11)
RDW: 15.9 % — ABNORMAL HIGH (ref 11.5–15.5)
WBC: 8.7 10*3/uL (ref 4.0–10.5)
nRBC: 0 % (ref 0.0–0.2)

## 2022-11-28 LAB — COMPREHENSIVE METABOLIC PANEL
ALT: 32 U/L (ref 0–44)
AST: 28 U/L (ref 15–41)
Albumin: 4.1 g/dL (ref 3.5–5.0)
Alkaline Phosphatase: 56 U/L (ref 38–126)
Anion gap: 12 (ref 5–15)
BUN: 13 mg/dL (ref 6–20)
CO2: 25 mmol/L (ref 22–32)
Calcium: 9.4 mg/dL (ref 8.9–10.3)
Chloride: 101 mmol/L (ref 98–111)
Creatinine, Ser: 0.78 mg/dL (ref 0.44–1.00)
GFR, Estimated: >60 mL/min (ref >60)
Glucose, Bld: 108 mg/dL — ABNORMAL HIGH (ref 70–99)
Potassium: 3.5 mmol/L (ref 3.5–5.1)
Sodium: 138 mmol/L (ref 135–145)
Total Bilirubin: 0.9 mg/dL (ref 0.3–1.2)
Total Protein: 7.9 g/dL (ref 6.5–8.1)

## 2022-11-28 LAB — I-STAT BETA HCG BLOOD, ED (MC, WL, AP ONLY): I-stat hCG, quantitative: <5 m[IU]/mL (ref <5)

## 2022-11-28 LAB — LIPASE, BLOOD: Lipase: 79 U/L — ABNORMAL HIGH (ref 11–51)

## 2022-11-28 MED ORDER — ALUM & MAG HYDROXIDE-SIMETH 200-200-20 MG/5ML PO SUSP
30.0000 mL | Freq: Once | ORAL | Status: AC
  Administered 2022-11-28: 30 mL via ORAL
  Filled 2022-11-28: qty 30

## 2022-11-28 MED ORDER — SODIUM CHLORIDE 0.9 % IV BOLUS
1000.0000 mL | Freq: Once | INTRAVENOUS | Status: AC
  Administered 2022-11-28: 1000 mL via INTRAVENOUS

## 2022-11-28 MED ORDER — FAMOTIDINE IN NACL 20-0.9 MG/50ML-% IV SOLN
20.0000 mg | Freq: Once | INTRAVENOUS | Status: AC
  Administered 2022-11-28: 20 mg via INTRAVENOUS
  Filled 2022-11-28: qty 50

## 2022-11-28 MED ORDER — DROPERIDOL 2.5 MG/ML IJ SOLN
1.2500 mg | Freq: Once | INTRAMUSCULAR | Status: AC
  Administered 2022-11-28: 1.25 mg via INTRAVENOUS
  Filled 2022-11-28: qty 2

## 2022-11-28 NOTE — Discharge Instructions (Addendum)
It was a pleasure taking care of you today!   Your labs didn't show any concerning findings today. You will be sent a prescription for zofran, you may take this medication if you are experiencing nausea or vomiting as directed. If you take the zofran, wait at least 30 minutes before you attempt small sips/bites of food/fluid. For the next couple of days, please follow the [place] (information attached). Ensure to maintain fluid intake with water, soup, pedialyte, gatorade, broth, tea.  Call your gastroenterologist to set up a follow-up appointment regarding today's ED visit.  Call your primary care provider to set up a follow up appointment regarding todays ED visit. Return to the ED if you are experiencing increasing/worsening symptoms.

## 2022-11-28 NOTE — ED Notes (Signed)
Reviewed discharge instructions with patient. Follow-up care and medications reviewed. Patient  verbalized understanding. Patient A&Ox4, VSS, and ambulatory with steady gait upon discharge.  °

## 2022-11-28 NOTE — ED Provider Notes (Signed)
[facility] Provider Note   CSN: [id number] Arrival date & time: 11/28/22  [id number]     History  Chief Complaint  Patient presents with   Abdominal Pain   Emesis    [name] [name] is a 32 y.o. female who presents to the ED with concerns for mid/upper abdominal pain x 4 days. Was seen in the ED for similar symptoms on 11/26/22. Notes that she has been able to consume liquids at home. Also tried eating mashed potatoes. Pt was informed to call her GI doctor for follow up, however, she hasn't been able to do that yet. Has been seen by GI before in the past and had an endoscopy completed. Was to follow up with them, but didn't due to cost. Has nausea, vomiting.  No meds tried at home.  Denies diarrhea or constipation.   Per pt chart review: Pt was seen in the ED on 11/26/22 for similar concerns without acute emergent concerning findings. Pt was discharged home with zofran and instructed to follow up with GI.   The history is provided by the patient. No language interpreter was used.       Home Medications Prior to Admission medications   Medication Sig Start Date End Date Taking? Authorizing Provider  ferrous sulfate 325 (65 FE) MG EC tablet Take 1 tablet (325 mg total) by mouth at bedtime. Patient not taking: Reported on 02/24/2020 12/31/19   Zehr, Laban Emperor, PA-C  fluconazole (DIFLUCAN) 150 MG tablet Take 1 tablet (150 mg total) by mouth every other day. 03/15/21   Volney American, PA-C  hyoscyamine (LEVSIN SL) 0.125 MG SL tablet Place 1 tablet (0.125 mg total) under the tongue every 12 (twelve) hours as needed for cramping. 02/24/20   Mauri Pole, MD  lidocaine (XYLOCAINE) 2 % solution Use as directed 10 mLs in the mouth or throat as needed for mouth pain. 03/15/21   Volney American, PA-C  omeprazole (PRILOSEC) 20 MG capsule Take 1 capsule (20 mg total) by mouth daily. 01/21/20   Zehr, Janett Billow D, PA-C  ondansetron (ZOFRAN-ODT) 4 MG  disintegrating tablet Take 1 tablet (4 mg total) by mouth every 8 (eight) hours as needed for nausea or vomiting. 11/26/22   Fredia Sorrow, MD  predniSONE (DELTASONE) 50 MG tablet 1 tablet daily with breakfast for 3 days 03/15/21   Volney American, PA-C  promethazine (PHENERGAN) 25 MG tablet TAKE 1 TABLET(25 MG) BY MOUTH EVERY 6 HOURS AS NEEDED FOR NAUSEA OR VOMITING 09/09/20   Milus Banister, MD      Allergies    Patient has no known allergies.    Review of Systems   Review of Systems  All other systems reviewed and are negative.   Physical Exam Updated Vital Signs BP (!) 120/59 (BP Location: Right Arm)   Pulse (!) 53   Temp 98.9 F (37.2 C) (Oral)   Resp 16   Ht 5\' 5"  (1.651 m)   Wt 72.6 kg   SpO2 100%   BMI 26.63 kg/m  Physical Exam Vitals and nursing note reviewed.  Constitutional:      General: She is not in acute distress.    Appearance: She is not diaphoretic.  HENT:     Head: Normocephalic and atraumatic.     Mouth/Throat:     Pharynx: No oropharyngeal exudate.  Eyes:     General: No scleral icterus.    Conjunctiva/sclera: Conjunctivae normal.  Cardiovascular:  Rate and Rhythm: Normal rate and regular rhythm.     Pulses: Normal pulses.     Heart sounds: Normal heart sounds.  Pulmonary:     Effort: Pulmonary effort is normal. No respiratory distress.     Breath sounds: Normal breath sounds. No wheezing.  Abdominal:     General: Bowel sounds are normal.     Palpations: Abdomen is soft. There is no mass.     Tenderness: There is generalized abdominal tenderness. There is no guarding or rebound.     Comments: Diffuse abdominal TTP  Musculoskeletal:        General: Normal range of motion.     Cervical back: Normal range of motion and neck supple.  Skin:    General: Skin is warm and dry.  Neurological:     Mental Status: She is alert.  Psychiatric:        Behavior: Behavior normal.     ED Results / Procedures / Treatments   Labs (all labs  ordered are listed, but only abnormal results are displayed) Labs Reviewed  CBC WITH DIFFERENTIAL/PLATELET - Abnormal; Notable for the following components:      Result Value   Hemoglobin 11.4 (*)    RDW 15.9 (*)    All other components within normal limits  COMPREHENSIVE METABOLIC PANEL - Abnormal; Notable for the following components:   Glucose, Bld 108 (*)    All other components within normal limits  LIPASE, BLOOD - Abnormal; Notable for the following components:   Lipase 79 (*)    All other components within normal limits  URINALYSIS, ROUTINE W REFLEX MICROSCOPIC - Abnormal; Notable for the following components:   Color, Urine RED (*)    APPearance TURBID (*)    Glucose, UA   (*)    Value: TEST NOT REPORTED DUE TO COLOR INTERFERENCE OF URINE PIGMENT   Hgb urine dipstick   (*)    Value: TEST NOT REPORTED DUE TO COLOR INTERFERENCE OF URINE PIGMENT   Bilirubin Urine   (*)    Value: TEST NOT REPORTED DUE TO COLOR INTERFERENCE OF URINE PIGMENT   Ketones, ur   (*)    Value: TEST NOT REPORTED DUE TO COLOR INTERFERENCE OF URINE PIGMENT   Protein, ur   (*)    Value: TEST NOT REPORTED DUE TO COLOR INTERFERENCE OF URINE PIGMENT   Nitrite   (*)    Value: TEST NOT REPORTED DUE TO COLOR INTERFERENCE OF URINE PIGMENT   Leukocytes,Ua   (*)    Value: TEST NOT REPORTED DUE TO COLOR INTERFERENCE OF URINE PIGMENT   All other components within normal limits  URINALYSIS, MICROSCOPIC (REFLEX) - Abnormal; Notable for the following components:   Bacteria, UA RARE (*)    All other components within normal limits  I-STAT BETA HCG BLOOD, ED ([facility], [facility], [facility] ONLY)    EKG EKG Interpretation  Date/Time:  Thursday November 28 2022 10:00:47 EDT Ventricular Rate:  48 PR Interval:  140 QRS Duration: 87 QT Interval:  486 QTC Calculation: 435 R Axis:   61 Text Interpretation: Sinus bradycardia no acute ST/T changes Confirmed by Sherwood Gambler [phone]) on 11/28/2022 11:00:22 AM  Radiology No results  found.  Procedures Procedures    Medications Ordered in ED Medications  sodium chloride 0.9 % bolus 1,000 mL (0 mLs Intravenous Stopped 11/28/22 1300)  famotidine (PEPCID) IVPB 20 mg premix (0 mg Intravenous Stopped 11/28/22 1049)  alum & mag hydroxide-simeth (MAALOX/MYLANTA) 200-200-20 MG/5ML suspension 30 mL (30 mLs  Oral Given 11/28/22 0954)  droperidol (INAPSINE) 2.5 MG/ML injection 1.25 mg (1.25 mg Intravenous Given 11/28/22 1049)  droperidol (INAPSINE) 2.5 MG/ML injection 1.25 mg (1.25 mg Intravenous Given 11/28/22 1441)    ED Course/ Medical Decision Making/ A&P Clinical Course as of 11/28/22 1812  Thu Nov 28, 2022  1120 Pt re-evaluated and resting comfortably on stretcher. Pt noted improvement of her nausea and vomiting.  [SB]  1452 Patient reevaluated and asleep comfortably. Discussed with patient findings on the urinalysis.  Patient notes that she has not taken any Azo.  She notes she is currently on her menstrual cycle.  Patient notes that she was told to fill out the cup so there is possibly "1 drop of urine but mostly blood".  Pt notes that she is on her menstrual cycle currently and noted that she filled the cup with blood from her menstrual cycle. When inquired on why patient did that, patient notes "he Investment banker, corporate) told me to fill the cup".  Discussed with patient that for urinalysis adding her vaginal blood does not help for rule out of acute cystitis.  Patient then turns her head to the side and discontinues conversation.  Discussed with patient that I reviewed her notes from her GI specialist and discussed the workup that they completed with her previously discussed with patient that she will need to call them today for follow-up.  Patient aware that she will get an additional dose of that medication and be discharged home. [SB]  1635 Pt standing and changing into her clothes on re-evaluation. She notes that she feels better. Discussed with patient again discharge treatment plan. Answered all  available questions. Pt appears safe for discharge at this time.  [SB]    Clinical Course User Index [SB] Ebert Forrester A, PA-C                             Medical Decision Making Amount and/or Complexity of Data Reviewed Labs: ordered.  Risk OTC drugs. Prescription drug management.   Patient presents to the emergency department with concerns for upper abdominal pain, nausea, emesis x 4 days.  Patient has a history of similar symptoms which she was evaluated by her GI specialist with a negative workup. Seen on 11/26/22 in the ED for similar symptoms. On exam patient with diffuse abdominal TTP. Remainder of exam without acute findings.  Pt afebrile. Differential diagnosis includes pancreatitis, cholecystitis, appendicitis, GERD, cannabinoid hyperemesis syndrome.   Additional history obtained:  External records from outside source obtained and reviewed including: Pt was seen in the ED on 11/26/22 for similar symptoms, at that time had a negative workup and was discharged home with Zofran. Instructed to follow up with her GI specialist.   Labs:  I ordered, and personally interpreted labs.  The pertinent results include:  Hcg negative Lipase slightly elevated at 79, stable from previous values CBC without leukocytosis CMP unremarkable Urinalysis non-contributory (See ED course for further information, patient placed blood from her menstrual cycle into the urinalysis cup)  Medications:  I ordered medication including droperidol, IVF, pepcid, GI cocktail for symptom management Reevaluation of the patient after these medicines and interventions, I reevaluated the patient and found that they have improved I have reviewed the patients home medicines and have made adjustments as needed Tolerated PO challenge in ED with above treatment regimen    Disposition: Presentation suspicious for diffuse abdominal pain, nausea, vomiting. Symptoms likely contributory to cannabinoid hyperemesis syndrome  due to patients resolution of symptoms with droperidol in the ED. Doubt concerns at this time with diverticulitis, appendicitis, pancreatitis (no acute findings noted on CT AP scan from 11/26/22). After consideration of the diagnostic results and the patients response to treatment, I feel that the patient would benefit from Discharge home. Pt has a refill of Zofran at the pharmacy. Work note provided. Instructed to follow up with her gastroenterologist regarding todays ED visit. Supportive care measures and strict return precautions discussed with patient at bedside. Pt acknowledges and verbalizes understanding. Pt appears safe for discharge. Follow up as indicated in discharge paperwork.   This chart was dictated using voice recognition software, Dragon. Despite the best efforts of this provider to proofread and correct errors, errors may still occur which can change documentation meaning.   Final Clinical Impression(s) / ED Diagnoses Final diagnoses:  Diffuse abdominal pain  Nausea and vomiting, unspecified vomiting type    Rx / DC Orders ED Discharge Orders     None         Laray Rivkin A, PA-C 11/28/22 1812    Gerhard Munch, MD 11/30/22 1911

## 2022-11-28 NOTE — ED Triage Notes (Signed)
Pt c/o N/V, mid upper abdominal painx4d. Pt states has vomited approx 20 times in last 24 hrs. Pt states she can't keep anything down.

## 2022-11-28 NOTE — ED Notes (Signed)
Pt ambulated to restroom without assistance.

## 2022-12-01 ENCOUNTER — Emergency Department (HOSPITAL_BASED_OUTPATIENT_CLINIC_OR_DEPARTMENT_OTHER)
Admission: EM | Admit: 2022-12-01 | Discharge: 2022-12-01 | Disposition: A | Discharge status: Home / Self Care | Payer: Self-pay | Attending: Emergency Medicine | Admitting: Emergency Medicine

## 2022-12-01 DIAGNOSIS — R1011 Right upper quadrant pain: Secondary | ICD-10-CM | POA: Insufficient documentation

## 2022-12-01 DIAGNOSIS — R1012 Left upper quadrant pain: Secondary | ICD-10-CM | POA: Insufficient documentation

## 2022-12-01 DIAGNOSIS — R000 Tachycardia, unspecified: Secondary | ICD-10-CM | POA: Insufficient documentation

## 2022-12-01 DIAGNOSIS — R1013 Epigastric pain: Secondary | ICD-10-CM | POA: Insufficient documentation

## 2022-12-01 DIAGNOSIS — R112 Nausea with vomiting, unspecified: Secondary | ICD-10-CM | POA: Insufficient documentation

## 2022-12-01 LAB — CBC
HCT: 35.9 % — ABNORMAL LOW (ref 36.0–46.0)
Hemoglobin: 11.8 g/dL — ABNORMAL LOW (ref 12.0–15.0)
MCH: 27.2 pg (ref 26.0–34.0)
MCHC: 32.9 g/dL (ref 30.0–36.0)
MCV: 82.7 fL (ref 80.0–100.0)
Platelets: 411 10*3/uL — ABNORMAL HIGH (ref 150–400)
RBC: 4.34 MIL/uL (ref 3.87–5.11)
RDW: 15.7 % — ABNORMAL HIGH (ref 11.5–15.5)
WBC: 9.6 10*3/uL (ref 4.0–10.5)
nRBC: 0 % (ref 0.0–0.2)

## 2022-12-01 LAB — COMPREHENSIVE METABOLIC PANEL
ALT: 21 U/L (ref 0–44)
AST: 14 U/L — ABNORMAL LOW (ref 15–41)
Albumin: 4.5 g/dL (ref 3.5–5.0)
Alkaline Phosphatase: 51 U/L (ref 38–126)
Anion gap: 11 (ref 5–15)
BUN: 13 mg/dL (ref 6–20)
CO2: 30 mmol/L (ref 22–32)
Calcium: 9.9 mg/dL (ref 8.9–10.3)
Chloride: 98 mmol/L (ref 98–111)
Creatinine, Ser: 0.75 mg/dL (ref 0.44–1.00)
GFR, Estimated: >60 mL/min (ref >60)
Glucose, Bld: 90 mg/dL (ref 70–99)
Potassium: 3.4 mmol/L — ABNORMAL LOW (ref 3.5–5.1)
Sodium: 139 mmol/L (ref 135–145)
Total Bilirubin: 0.8 mg/dL (ref 0.3–1.2)
Total Protein: 8 g/dL (ref 6.5–8.1)

## 2022-12-01 LAB — URINALYSIS, MICROSCOPIC (REFLEX)
Bacteria, UA: NONE SEEN
RBC / HPF: >50 RBC/hpf (ref 0–5)
Squamous Epithelial / HPF: >50 /HPF (ref 0–5)
WBC, UA: >50 WBC/hpf (ref 0–5)

## 2022-12-01 LAB — PREGNANCY, URINE: Preg Test, Ur: NEGATIVE

## 2022-12-01 LAB — URINALYSIS, ROUTINE W REFLEX MICROSCOPIC

## 2022-12-01 LAB — LIPASE, BLOOD: Lipase: 71 U/L — ABNORMAL HIGH (ref 11–51)

## 2022-12-01 MED ORDER — SODIUM CHLORIDE 0.9 % IV BOLUS: 1000.0000 mL | Freq: Once | INTRAVENOUS | Status: DC

## 2022-12-01 MED ORDER — SODIUM CHLORIDE 0.9 % IV BOLUS
1000.0000 mL | Freq: Once | INTRAVENOUS | Status: AC
  Administered 2022-12-01: 1000 mL via INTRAVENOUS

## 2022-12-01 MED ORDER — DROPERIDOL 2.5 MG/ML IJ SOLN
2.5000 mg | Freq: Once | INTRAMUSCULAR | Status: AC
  Administered 2022-12-01: 2.5 mg via INTRAVENOUS
  Filled 2022-12-01: qty 2

## 2022-12-01 MED ORDER — PROMETHAZINE HCL 25 MG PO TABS: 25.0000 mg | ORAL_TABLET | Freq: Four times a day (QID) | ORAL | 0 refills | Status: DC | PRN

## 2022-12-01 MED ORDER — MORPHINE SULFATE (PF) 4 MG/ML IV SOLN: 4.0000 mg | Freq: Once | INTRAVENOUS | Status: DC

## 2022-12-01 MED ORDER — METOCLOPRAMIDE HCL 5 MG/ML IJ SOLN
10.0000 mg | Freq: Once | INTRAMUSCULAR | Status: AC
  Administered 2022-12-01: 10 mg via INTRAVENOUS
  Filled 2022-12-01: qty 2

## 2022-12-01 NOTE — Discharge Instructions (Addendum)
Your laboratories also are within normal limits today.  You will need to follow-up with your gastroenterologist in order to obtain further management of the ongoing abdominal pain.  I have prescribed medication to help with your nausea and vomiting, you were previously placed on this medication by your gastroenterologist.  Please take 1 tablet every 6 hours as needed.  I do feel that you do need gastroenterology follow-up.

## 2022-12-01 NOTE — ED Triage Notes (Signed)
Pt reports epigastric pain along with n/v for 1 week.  Pt also c/o right hip pain.  Pt states 15-20 episodes of emesis, no bowel mov't since last Tuesday and only urinates once/day.  Pt reports similar episode years ago with undetermined cause.

## 2022-12-01 NOTE — ED Provider Notes (Signed)
[facility] Provider Note   CSN: [id number] Arrival date & time: 12/01/22  1125     History  Chief Complaint  Patient presents with   Abdominal Pain    [name] [name] is a 32 y.o. female.  32 y.o female with a PMH of reflux presents to the ED with a chief complaint of abdominal pain for the past week.  Patient describes it as an aching dull constant to her lower abdomen.  Evaluated in the ED approximately 4 days ago, had a negative CT scan.  She is scheduled for a GI next month.  She reports she has not been able to keep anything down the entire week, has been taking Zofran but it has not been helping with her symptoms aside from giving her heartburn.  She does have a history of marijuana use, but reports that last time she smoked was 28 days ago.  Does not have any urinary symptoms, no fever, no sick exposures. He did have a prior diascopy that did not show any acute findings several years ago.  The history is provided by the patient.  Abdominal Pain Pain location:  LUQ and RUQ Pain quality: aching and dull   Pain radiates to:  Does not radiate Pain severity:  Moderate Onset quality:  Gradual Duration:  1 week Timing:  Intermittent Progression:  Worsening Chronicity:  New Context: not alcohol use, not diet changes, not previous surgeries, not recent illness, not sick contacts and not suspicious food intake   Relieved by:  Nothing Worsened by:  Nothing Associated symptoms: nausea and vomiting   Associated symptoms: no chest pain, no chills, no fever, no shortness of breath and no sore throat        Home Medications Prior to Admission medications   Medication Sig Start Date End Date Taking? Authorizing Provider  promethazine (PHENERGAN) 25 MG tablet Take 1 tablet (25 mg total) by mouth every 6 (six) hours as needed for up to 7 days for nausea or vomiting. 12/01/22 12/08/22 Yes Rajan Burgard, Leonie Douglas, PA-C  ferrous sulfate 325 (65 FE) MG EC  tablet Take 1 tablet (325 mg total) by mouth at bedtime. Patient not taking: Reported on 02/24/2020 12/31/19   Zehr, Princella Pellegrini, PA-C  fluconazole (DIFLUCAN) 150 MG tablet Take 1 tablet (150 mg total) by mouth every other day. 03/15/21   Particia Nearing, PA-C  hyoscyamine (LEVSIN SL) 0.125 MG SL tablet Place 1 tablet (0.125 mg total) under the tongue every 12 (twelve) hours as needed for cramping. 02/24/20   Napoleon Form, MD  lidocaine (XYLOCAINE) 2 % solution Use as directed 10 mLs in the mouth or throat as needed for mouth pain. 03/15/21   Particia Nearing, PA-C  omeprazole (PRILOSEC) 20 MG capsule Take 1 capsule (20 mg total) by mouth daily. 01/21/20   Zehr, Shanda Bumps D, PA-C  ondansetron (ZOFRAN-ODT) 4 MG disintegrating tablet Take 1 tablet (4 mg total) by mouth every 8 (eight) hours as needed for nausea or vomiting. 11/26/22   Vanetta Mulders, MD  predniSONE (DELTASONE) 50 MG tablet 1 tablet daily with breakfast for 3 days 03/15/21   Particia Nearing, PA-C      Allergies    Patient has no known allergies.    Review of Systems   Review of Systems  Constitutional:  Negative for chills and fever.  HENT:  Negative for sore throat.   Respiratory:  Negative for shortness of breath.   Cardiovascular:  Negative for  chest pain.  Gastrointestinal:  Positive for abdominal pain, nausea and vomiting.  Genitourinary:  Negative for flank pain.  Musculoskeletal:  Negative for back pain.  Neurological:  Negative for light-headedness and headaches.  All other systems reviewed and are negative.   Physical Exam Updated Vital Signs BP 124/68 (BP Location: Right Arm)   Pulse 99   Temp 98.4 F (36.9 C) (Oral)   Resp 16   LMP 11/27/2022 (Exact Date)   SpO2 96%  Physical Exam Vitals and nursing note reviewed.  Constitutional:      General: She is not in acute distress.    Appearance: She is well-developed.  HENT:     Head: Normocephalic and atraumatic.     Mouth/Throat:      Pharynx: No oropharyngeal exudate.  Eyes:     Pupils: Pupils are equal, round, and reactive to light.  Cardiovascular:     Rate and Rhythm: Regular rhythm. Tachycardia present.     Heart sounds: Normal heart sounds.  Pulmonary:     Effort: Pulmonary effort is normal. No respiratory distress.     Breath sounds: Normal breath sounds.  Abdominal:     General: Bowel sounds are normal. There is no distension.     Palpations: Abdomen is soft.     Tenderness: There is abdominal tenderness in the right upper quadrant, epigastric area and left upper quadrant. There is no right CVA tenderness or left CVA tenderness.  Musculoskeletal:        General: No tenderness or deformity.     Cervical back: Normal range of motion.     Right lower leg: No edema.     Left lower leg: No edema.  Skin:    General: Skin is warm and dry.  Neurological:     Mental Status: She is alert and oriented to person, place, and time.     ED Results / Procedures / Treatments   Labs (all labs ordered are listed, but only abnormal results are displayed) Labs Reviewed  LIPASE, BLOOD - Abnormal; Notable for the following components:      Result Value   Lipase 71 (*)    All other components within normal limits  COMPREHENSIVE METABOLIC PANEL - Abnormal; Notable for the following components:   Potassium 3.4 (*)    AST 14 (*)    All other components within normal limits  CBC - Abnormal; Notable for the following components:   Hemoglobin 11.8 (*)    HCT 35.9 (*)    RDW 15.7 (*)    Platelets 411 (*)    All other components within normal limits  URINALYSIS, ROUTINE W REFLEX MICROSCOPIC - Abnormal; Notable for the following components:   Color, Urine RED (*)    APPearance CLOUDY (*)    Glucose, UA   (*)    Value: TEST NOT REPORTED DUE TO COLOR INTERFERENCE OF URINE PIGMENT   Hgb urine dipstick   (*)    Value: TEST NOT REPORTED DUE TO COLOR INTERFERENCE OF URINE PIGMENT   Bilirubin Urine   (*)    Value: TEST NOT  REPORTED DUE TO COLOR INTERFERENCE OF URINE PIGMENT   Ketones, ur   (*)    Value: TEST NOT REPORTED DUE TO COLOR INTERFERENCE OF URINE PIGMENT   Protein, ur   (*)    Value: TEST NOT REPORTED DUE TO COLOR INTERFERENCE OF URINE PIGMENT   Nitrite   (*)    Value: TEST NOT REPORTED DUE TO COLOR INTERFERENCE OF URINE PIGMENT  Leukocytes,Ua   (*)    Value: TEST NOT REPORTED DUE TO COLOR INTERFERENCE OF URINE PIGMENT   All other components within normal limits  PREGNANCY, URINE  URINALYSIS, MICROSCOPIC (REFLEX)    EKG EKG Interpretation  Date/Time:  Sunday December 01 2022 12:08:07 EDT Ventricular Rate:  127 PR Interval:  140 QRS Duration: 66 QT Interval:  318 QTC Calculation: 462 R Axis:   77 Text Interpretation: Sinus tachycardia Right atrial enlargement Nonspecific ST abnormality Abnormal ECG Since last tracing rate slower Otherwise no significant change Confirmed by Melene Plan [id number]) on 12/01/2022 12:11:59 PM  Radiology No results found.  Procedures Procedures    Medications Ordered in ED Medications  sodium chloride 0.9 % bolus 1,000 mL (1,000 mLs Intravenous Not Given 12/01/22 1406)  sodium chloride 0.9 % bolus 1,000 mL (0 mLs Intravenous Stopped 12/01/22 1450)  metoCLOPramide (REGLAN) injection 10 mg (10 mg Intravenous Given 12/01/22 1428)  droperidol (INAPSINE) 2.5 MG/ML injection 2.5 mg (2.5 mg Intravenous Given 12/01/22 1428)    ED Course/ Medical Decision Making/ A&P                             Medical Decision Making Amount and/or Complexity of Data Reviewed Labs: ordered.  Risk Prescription drug management.   This patient presents to the ED for concern of abdominal pain, this involves a number of treatment options, and is a complaint that carries with it a high risk of complications and morbidity.  The differential diagnosis includes obstruction, viral illness, versus cyclical vomiting.    Co morbidities: Discussed in HPI   Brief History:  See HPI.   EMR  reviewed including pt PMHx, past surgical history and past visits to ER.   See HPI for more details   Lab Tests:  I ordered and independently interpreted labs.  The pertinent results include:    I personally reviewed all laboratory work and imaging. Metabolic panel without any acute abnormality specifically kidney function within normal limits and no significant electrolyte abnormalities. CBC without leukocytosis or significant anemia.   Imaging Studies:  No imaging studies ordered for this patient Patient had normal CT abdomen and pelvis 3 days ago.    Cardiac Monitoring:  The patient was maintained on a cardiac monitor.  I personally viewed and interpreted the cardiac monitored which showed an underlying rhythm of: Sonus tachycardia EKG non-ischemic QTC ~300s  Medicines ordered:  I ordered medication including bolus, droperidol  for symptomatic treatment  Reevaluation of the patient after these medicines showed that the patient improved I have reviewed the patients home medicines and have made adjustments as needed  Reevaluation:  After the interventions noted above I re-evaluated patient and found that they have :improved  Social Determinants of Health:  The patient's social determinants of health were a factor in the care of this patient  Problem List / ED Course:  Patient presents to the ED with a chief complaint of ongoing nausea, vomiting, abdominal pain that is been ongoing for several days.  This is patient's third visit into the emergency department for the same complaints.  She does report a prior history of gastritis, did have an EGD back in 2021 by Dr. Lavon Paganini of [facility] gastroenterology.  However results of the endoscopy of July 2021 do show that she has had some mild gastritis, she did not have any signs of celiac disease, no signs of dysplasia or malignancy. He did have a CAT scan in the  ED approximately 3 days ago, this did not show any acute findings.   Today her blood work is unremarkable without any leukocytosis, current levels unremarkable.  She does appear clinically dry on exam.  Pace level somewhat elevated however improved from her last visit, she denies any history of alcohol use or abuse.  UA is unable to be run due to significant color changes, she is currently on her menstrual cycle but is not having any urinary symptoms at this time.  She received Reglan, bolus, droperidol with resolution in her symptoms.  Serial abdominal exams performed without any focal point of tenderness.  I do feel that this is likely acute on chronic pain, I did discuss with patient appropriate follow-up with gastroenterology. Cording to extensive chart review, patient was previously taken Phenergan to help with her nausea and vomiting, she reports she has not been able to follow-up with gastroenterology due to financial strain, therefore a prescription for Phenergan was provided for her.  We discussed strict return precautions, she is hemodynamically stable, afebrile stable for discharge.  Dispostion:  After consideration of the diagnostic results and the patients response to treatment, I feel that the patent would benefit from outpatient follow-up with gastroenterology.    Portions of this note were generated with Scientist, clinical (histocompatibility and immunogenetics). Dictation errors may occur despite best attempts at proofreading.   Final Clinical Impression(s) / ED Diagnoses Final diagnoses:  Nausea and vomiting, unspecified vomiting type    Rx / DC Orders ED Discharge Orders          Ordered    promethazine (PHENERGAN) 25 MG tablet  Every 6 hours PRN        12/01/22 1522              Claude Manges, PA-C 12/01/22 1528    Melene Plan, DO 12/04/22 0700

## 2022-12-01 NOTE — ED Notes (Signed)
Discharge paperwork given and verbally understood. 

## 2022-12-01 NOTE — ED Notes (Signed)
Pt passed fluid challenge. 

## 2022-12-17 ENCOUNTER — Encounter (HOSPITAL_COMMUNITY): Payer: Self-pay | Admitting: *Deleted

## 2022-12-17 ENCOUNTER — Ambulatory Visit (HOSPITAL_COMMUNITY)
Admission: EM | Admit: 2022-12-17 | Discharge: 2022-12-17 | Disposition: A | Discharge status: Home / Self Care | Payer: Self-pay | Attending: Internal Medicine | Admitting: Internal Medicine

## 2022-12-17 ENCOUNTER — Ambulatory Visit (INDEPENDENT_AMBULATORY_CARE_PROVIDER_SITE_OTHER): Payer: Self-pay

## 2022-12-17 ENCOUNTER — Other Ambulatory Visit: Payer: Self-pay

## 2022-12-17 DIAGNOSIS — M79645 Pain in left finger(s): Secondary | ICD-10-CM

## 2022-12-17 NOTE — ED Provider Notes (Signed)
[facility]    CSN: [id number] Arrival date & time: 12/17/22  0801      History   Chief Complaint Chief Complaint  Patient presents with   Finger Injury    HPI [name] [name] is a 32 y.o. female.   Patient presents to urgent care for evaluation of pain and swelling to the left pointer finger that started approximately 2 to 3 weeks ago after she injured it while taking a shower.  She states her finger pain improved initially after injury, however she hurt her finger "crack" a few days ago and began to hurt again.  No numbness or tingling distally to injury.  Denies bruising and swelling after injury.  Denies previous injury to the left hand and the affected digit.  No trauma to the nail of the affected digit.  Pain is mostly to the PIP of the left pointer finger and the soft tissues between the MCP and PIP joint.  She has not attempted use of any over-the-counter medications to help with pain or swelling before coming to urgent care.     Past Medical History:  Diagnosis Date   Allergy    Anemia    Chlamydia    06/15/14 w/tx, 10/11/14 w/tx, TOC-neg x2   Eczema    GERD (gastroesophageal reflux disease)    Gonorrhea 06/15/14   w/tx, TOC- neg   History of anemia    Infection    chlamydia & gonorrhea   Trichomonas     Patient Active Problem List   Diagnosis Date Noted   Chronic gastritis without bleeding 03/04/2020   Colitis 03/04/2020   Hypokalemia 03/04/2020   Menorrhagia with regular cycle 03/04/2020   Nausea and vomiting 12/31/2019   Epigastric pain 12/31/2019   Iron deficiency anemia 12/31/2019   Encounter for female sterilization procedure     Past Surgical History:  Procedure Laterality Date   fractured finger as a child     LAPAROSCOPIC TUBAL LIGATION Bilateral 03/16/2015   Procedure: LAPAROSCOPIC TUBAL LIGATION WITH FILSHIE CLIPS;  Surgeon: Tereso Newcomer, MD;  Location: [facility];  Service: Gynecology;  Laterality: Bilateral;  With Filshie  clips  Requested 03/16/15 @ 1:00p   UPPER GASTROINTESTINAL ENDOSCOPY  02/24/2020   WISDOM TOOTH EXTRACTION      OB History     Gravida  5   Para  4   Term  4   Preterm      AB  1   Living  4      SAB  1   IAB      Ectopic      Multiple  0   Live Births  4            Home Medications    Prior to Admission medications   Medication Sig Start Date End Date Taking? Authorizing Provider  ferrous sulfate 325 (65 FE) MG EC tablet Take 1 tablet (325 mg total) by mouth at bedtime. Patient not taking: Reported on 02/24/2020 12/31/19   Zehr, Princella Pellegrini, PA-C  fluconazole (DIFLUCAN) 150 MG tablet Take 1 tablet (150 mg total) by mouth every other day. 03/15/21   Particia Nearing, PA-C  hyoscyamine (LEVSIN SL) 0.125 MG SL tablet Place 1 tablet (0.125 mg total) under the tongue every 12 (twelve) hours as needed for cramping. 02/24/20   Napoleon Form, MD  lidocaine (XYLOCAINE) 2 % solution Use as directed 10 mLs in the mouth or throat as needed for mouth pain. 03/15/21  Particia Nearing, PA-C  omeprazole (PRILOSEC) 20 MG capsule Take 1 capsule (20 mg total) by mouth daily. 01/21/20   Zehr, Shanda Bumps D, PA-C  ondansetron (ZOFRAN-ODT) 4 MG disintegrating tablet Take 1 tablet (4 mg total) by mouth every 8 (eight) hours as needed for nausea or vomiting. 11/26/22   Vanetta Mulders, MD  predniSONE (DELTASONE) 50 MG tablet 1 tablet daily with breakfast for 3 days 03/15/21   Particia Nearing, PA-C  promethazine (PHENERGAN) 25 MG tablet Take 1 tablet (25 mg total) by mouth every 6 (six) hours as needed for up to 7 days for nausea or vomiting. 12/01/22 12/08/22  Claude Manges, PA-C    Family History Family History  Problem Relation Age of Onset   Asthma Sister    Heart disease Maternal Grandmother    Other Neg Hx    Colon cancer Neg Hx    Colon polyps Neg Hx    Esophageal cancer Neg Hx    Rectal cancer Neg Hx    Stomach cancer Neg Hx     Social History Social History    Tobacco Use   Smoking status: Never   Smokeless tobacco: Never  Vaping Use   Vaping Use: Never used  Substance Use Topics   Alcohol use: No   Drug use: No     Allergies   Patient has no known allergies.   Review of Systems Review of Systems Per HPI  Physical Exam Triage Vital Signs ED Triage Vitals  Enc Vitals Group     BP 12/17/22 0828 118/75     Pulse Rate 12/17/22 0828 (!) 107     Resp 12/17/22 0828 18     Temp 12/17/22 0828 98.7 F (37.1 C)     Temp src --      SpO2 12/17/22 0828 97 %     Weight --      Height --      Head Circumference --      Peak Flow --      Pain Score 12/17/22 0824 9     Pain Loc --      Pain Edu? --      Excl. in GC? --    No data found.  Updated Vital Signs BP 118/75   Pulse (!) 107   Temp 98.7 F (37.1 C)   Resp 18   LMP 11/27/2022 (Exact Date)   SpO2 97%   Visual Acuity Right Eye Distance:   Left Eye Distance:   Bilateral Distance:    Right Eye Near:   Left Eye Near:    Bilateral Near:     Physical Exam Vitals and nursing note reviewed.  Constitutional:      Appearance: She is not ill-appearing or toxic-appearing.  HENT:     Head: Normocephalic and atraumatic.     Right Ear: Hearing and external ear normal.     Left Ear: Hearing and external ear normal.     Nose: Nose normal.     Mouth/Throat:     Lips: Pink.  Eyes:     General: Lids are normal. Vision grossly intact. Gaze aligned appropriately.     Extraocular Movements: Extraocular movements intact.     Conjunctiva/sclera: Conjunctivae normal.  Pulmonary:     Effort: Pulmonary effort is normal.  Musculoskeletal:     Right hand: Normal.     Left hand: Tenderness (TTP to the PIP and MCP joints of the left pointer finger.) and bony tenderness present. No swelling, deformity or  lacerations. Normal range of motion (Full range of motion of the left pointer finger). Normal strength (Normal strength with flexion and extension against resistance of the affected  digit). Normal sensation. There is no disruption of two-point discrimination. Normal capillary refill. Normal pulse.     Cervical back: Neck supple.  Skin:    General: Skin is warm and dry.     Capillary Refill: Capillary refill takes less than 2 seconds.     Findings: No rash.  Neurological:     General: No focal deficit present.     Mental Status: She is alert and oriented to person, place, and time. Mental status is at baseline.     Cranial Nerves: No dysarthria or facial asymmetry.  Psychiatric:        Mood and Affect: Mood normal.        Speech: Speech normal.        Behavior: Behavior normal.        Thought Content: Thought content normal.        Judgment: Judgment normal.      UC Treatments / Results  Labs (all labs ordered are listed, but only abnormal results are displayed) Labs Reviewed - No data to display  EKG   Radiology DG Finger Index Left  Result Date: 12/17/2022 CLINICAL DATA:  Left index finger pain EXAM: LEFT INDEX FINGER 3V COMPARISON:  None Available. FINDINGS: There is no evidence of fracture or dislocation. There is no evidence of arthropathy or other focal bone abnormality. Soft tissues are unremarkable. IMPRESSION: Negative. Electronically Signed   By: Allegra Lai M.D.   On: 12/17/2022 08:56    Procedures Procedures (including critical care time)  Medications Ordered in UC Medications - No data to display  Initial Impression / Assessment and Plan / UC Course  I have reviewed the triage vital signs and the nursing notes.  Pertinent labs & imaging results that were available during my care of the patient were reviewed by me and considered in my medical decision making (see chart for details).   1.  Pain of finger of left hand X-rays negative for acute fracture or dislocation of the left finger.  Nail is intact.  Stable MSK exam, neurovascularly intact distally to injury.  Will manage this with ibuprofen 600 mg every 6 hours as needed for pain  and inflammation.  RICE advised.  May follow-up here or with PCP as needed for ongoing evaluation of symptoms if they persist.   Discussed physical exam and available lab work findings in clinic with patient.  Counseled patient regarding appropriate use of medications and potential side effects for all medications recommended or prescribed today. Discussed red flag signs and symptoms of worsening condition,when to call the PCP office, return to urgent care, and when to seek higher level of care in the emergency department. Patient verbalizes understanding and agreement with plan. All questions answered. Patient discharged in stable condition.    Final Clinical Impressions(s) / UC Diagnoses   Final diagnoses:  Pain of finger of left hand     Discharge Instructions      Your x-ray is negative for fracture and dislocation of your finger.  Apply ice and elevate your left hand as needed for pain and swelling.  Take ibuprofen 600mg  every 6 hours as needed for finger pain and inflammation (3 over the counter 200mg  pills every 6 hours as needed).   Return to urgent care as needed.   ED Prescriptions   None  PDMP not reviewed this encounter.   Reita May [place], [place] 12/17/22 [phone]

## 2022-12-17 NOTE — Discharge Instructions (Signed)
Your x-ray is negative for fracture and dislocation of your finger.  Apply ice and elevate your left hand as needed for pain and swelling.  Take ibuprofen  every 6 hours as needed for finger pain and inflammation (3 over the counter  pills every 6 hours as needed).   Return to urgent care as needed.

## 2022-12-17 NOTE — ED Triage Notes (Signed)
Pt fell on Lt index finger about a week ago and while taking a shower the other day she heard a crack in same finger while wringing out the wash cloth.

## 2023-01-23 ENCOUNTER — Ambulatory Visit: Payer: Self-pay | Admitting: Physician Assistant

## 2023-01-27 NOTE — Progress Notes (Deleted)
01/27/2023 [name] [name] [id number] [birth year]  Referring provider: No ref. provider found Primary GI doctor: Dr. Lavon Paganini  ASSESSMENT AND PLAN:   There are no diagnoses linked to this encounter.   Patient Care Team: Patient, No Pcp Per as PCP - General (General Practice)  HISTORY OF PRESENT ILLNESS: 32 y.o. female with a past medical history of IDA, menorrhagia and others listed below presents for evaluation of ***.   02/2020 EGD for nausea and vomiting  - Normal esophagus. - Gastroesophageal flap valve classified as Hill Grade III ( minimal fold, loose to endoscope, hiatal hernia likely) . - Z- line regular, 35 cm from the incisors. - Gastritis. Biopsied. - Flattened mucosa was found in the duodenum, rule out celiac disease. Biopsied. Neg H pylori gastritis, negative celiac 01/19/2020 HIDA  43 % EF, patent biliary tree 11/26/22 CT AB and pelvis with contrast Bilateral nonobstructive calyceal calculi, measuring up to 4 mm in the lower pole of the left kidney and 3 mm in the lower pole right kidney. No hydronephrosis. 12/01/22 HGB 11.8, MCV 82.7, platelets 411, lipase 71, potassium 34 Urine was red with WBC, RBS, mucus, no bacteria, unable to calculate due to color  Patient {Actions; denies-reports:120008} family history of colon cancer or other gastrointestinal malignancies. She {Actions; denies-reports:120008} blood thinner use.  She {Actions; denies-reports:120008} NSAID use.  She {Actions; denies-reports:120008} ETOH use.   She {Actions; denies-reports:120008} tobacco use.  She {Actions; denies-reports:120008} drug use.    She  reports that she has never smoked. She has never used smokeless tobacco. She reports that she does not drink alcohol and does not use drugs.  RELEVANT LABS AND IMAGING: CBC    Component Value Date/Time   WBC 9.6 12/01/2022 1304   RBC 4.34 12/01/2022 1304   HGB 11.8 (L) 12/01/2022 1304   HGB 8.7 08/09/2013 0000   HCT 35.9 (L)  12/01/2022 1304   HCT 26 08/09/2013 0000   PLT 411 (H) 12/01/2022 1304   PLT 220 08/09/2013 0000   MCV 82.7 12/01/2022 1304   MCH 27.2 12/01/2022 1304   MCHC 32.9 12/01/2022 1304   RDW 15.7 (H) 12/01/2022 1304   LYMPHSABS 1.6 11/28/2022 0940   MONOABS 0.6 11/28/2022 0940   EOSABS 0.0 11/28/2022 0940   BASOSABS 0.1 11/28/2022 0940   Recent Labs    11/26/22 0956 11/28/22 0940 12/01/22 1304  HGB 11.6* 11.4* 11.8*    CMP     Component Value Date/Time   NA 139 12/01/2022 1304   K 3.4 (L) 12/01/2022 1304   CL 98 12/01/2022 1304   CO2 30 12/01/2022 1304   GLUCOSE 90 12/01/2022 1304   BUN 13 12/01/2022 1304   CREATININE 0.75 12/01/2022 1304   CALCIUM 9.9 12/01/2022 1304   PROT 8.0 12/01/2022 1304   ALBUMIN 4.5 12/01/2022 1304   AST 14 (L) 12/01/2022 1304   ALT 21 12/01/2022 1304   ALKPHOS 51 12/01/2022 1304   BILITOT 0.8 12/01/2022 1304   GFRNONAA >60 12/01/2022 1304   GFRAA >60 12/22/2019 1944      Latest Ref Rng & Units 12/01/2022    1:04 PM 11/28/2022    9:40 AM 11/26/2022    9:56 AM  Hepatic Function  Total Protein 6.5 - 8.1 g/dL 8.0  7.9  8.7   Albumin 3.5 - 5.0 g/dL 4.5  4.1  4.3   AST 15 - 41 U/L 14  28  25    ALT 0 - 44 U/L 21  32  32  Alk Phosphatase 38 - 126 U/L 51  56  67   Total Bilirubin 0.3 - 1.2 mg/dL 0.8  0.9  0.6       Current Medications:   Current Outpatient Medications (Endocrine & Metabolic):    predniSONE (DELTASONE) 50 MG tablet, 1 tablet daily with breakfast for 3 days   Current Outpatient Medications (Respiratory):    promethazine (PHENERGAN) 25 MG tablet, Take 1 tablet (25 mg total) by mouth every 6 (six) hours as needed for up to 7 days for nausea or vomiting.   Current Outpatient Medications (Hematological):    ferrous sulfate 325 (65 FE) MG EC tablet, Take 1 tablet (325 mg total) by mouth at bedtime. (Patient not taking: Reported on 02/24/2020)  Current Outpatient Medications (Other):    fluconazole (DIFLUCAN) 150 MG tablet, Take 1  tablet (150 mg total) by mouth every other day.   hyoscyamine (LEVSIN SL) 0.125 MG SL tablet, Place 1 tablet (0.125 mg total) under the tongue every 12 (twelve) hours as needed for cramping.   lidocaine (XYLOCAINE) 2 % solution, Use as directed 10 mLs in the mouth or throat as needed for mouth pain.   omeprazole (PRILOSEC) 20 MG capsule, Take 1 capsule (20 mg total) by mouth daily.   ondansetron (ZOFRAN-ODT) 4 MG disintegrating tablet, Take 1 tablet (4 mg total) by mouth every 8 (eight) hours as needed for nausea or vomiting.  Medical History:  Past Medical History:  Diagnosis Date   Allergy    Anemia    Chlamydia    06/15/14 w/tx, 10/11/14 w/tx, TOC-neg x2   Eczema    GERD (gastroesophageal reflux disease)    Gonorrhea 06/15/14   w/tx, TOC- neg   History of anemia    Infection    chlamydia & gonorrhea   Trichomonas    Allergies: No Known Allergies   Surgical History:  She  has a past surgical history that includes Wisdom tooth extraction; fractured finger as a child; Laparoscopic tubal ligation (Bilateral, 03/16/2015); and Upper gastrointestinal endoscopy (02/24/2020). Family History:  Her family history includes Asthma in her sister; Heart disease in her maternal grandmother.  REVIEW OF SYSTEMS  : All other systems reviewed and negative except where noted in the History of Present Illness.  PHYSICAL EXAM: There were no vitals taken for this visit. General Appearance: Well nourished, in no apparent distress. Head:   Normocephalic and atraumatic. Eyes:  sclerae anicteric,conjunctive pink  Respiratory: Respiratory effort normal, BS equal bilaterally without rales, rhonchi, wheezing. Cardio: RRR with no MRGs. Peripheral pulses intact.  Abdomen: Soft,  {BlankSingle:19197::"Flat","Obese","Non-distended"} ,active bowel sounds. {actendernessAB:27319} tenderness {anatomy; site abdomen:5010}. {BlankMultiple:19196::"Without guarding","With guarding","Without rebound","With rebound"}. No  masses. Rectal: {acrectalexam:27461} Musculoskeletal: Full ROM, {PSY - GAIT AND STATION:22860} gait. {With/Without:304960234} edema. Skin:  Dry and intact without significant lesions or rashes Neuro: Alert and  oriented x4;  No focal deficits. Psych:  Cooperative. Normal mood and affect.    Doree Albee, PA-C 8:13 AM

## 2023-01-28 ENCOUNTER — Ambulatory Visit: Payer: Self-pay | Admitting: Physician Assistant

## 2023-02-04 ENCOUNTER — Ambulatory Visit: Payer: Self-pay | Admitting: Nurse Practitioner

## 2023-02-04 NOTE — Progress Notes (Deleted)
02/04/2023 [name] [name] [id number] 01/03/1991   Chief Complaint:  History of Present Illness: [name] [name] is a 32 year old female with a past medical history of anemia and GERD. She is known by Dr. Lavon Paganini.      Latest Ref Rng & Units 12/01/2022    1:04 PM 11/28/2022    9:40 AM 11/26/2022    9:56 AM  CBC  WBC 4.0 - 10.5 K/uL 9.6  8.7  10.2   Hemoglobin 12.0 - 15.0 g/dL 40.9  81.1  91.4   Hematocrit 36.0 - 46.0 % 35.9  36.2  36.4   Platelets 150 - 400 K/uL 411  253  316        Latest Ref Rng & Units 12/01/2022    1:04 PM 11/28/2022    9:40 AM 11/26/2022    9:56 AM  CMP  Glucose 70 - 99 mg/dL 90  782  956   BUN 6 - 20 mg/dL 13  13  12    Creatinine 0.44 - 1.00 mg/dL 2.13  0.86  5.78   Sodium 135 - 145 mmol/L 139  138  138   Potassium 3.5 - 5.1 mmol/L 3.4  3.5  3.7   Chloride 98 - 111 mmol/L 98  101  102   CO2 22 - 32 mmol/L 30  25  23    Calcium 8.9 - 10.3 mg/dL 9.9  9.4  9.8   Total Protein 6.5 - 8.1 g/dL 8.0  7.9  8.7   Total Bilirubin 0.3 - 1.2 mg/dL 0.8  0.9  0.6   Alkaline Phos 38 - 126 U/L 51  56  67   AST 15 - 41 U/L 14  28  25    ALT 0 - 44 U/L 21  32  32     EGD 02/24/2020: - Normal esophagus.  - Gastroesophageal flap valve classified as Hill Grade III (minimal fold, loose to endoscope, hiatal hernia likely).  - Z-line regular, 35 cm from the incisors.  - Gastritis. Biopsied.  - Flattened mucosa was found in the duodenum, rule out celiac disease. Biopsied. 1. Surgical [P], duodenal biopsies - BENIGN DUODENAL MUCOSA. - NO FEATURES OF CELIAC SPRUE OR GRANULOMAS. 2. Surgical [P], gastric biopsies - ANTRAL AND OXYNTIC MUCOSA WITH SLIGHT CHRONIC INFLAMMATION. [place] NEGATIVE FOR HELICOBACTER PYLORI. - NO INTESTINAL METAPLASIA, DYSPLASIA OR CARCINOMA.  Past Medical History:  Diagnosis Date   Allergy    Anemia    Chlamydia    06/15/14 w/tx, 10/11/14 w/tx, TOC-neg x2   Eczema    GERD (gastroesophageal reflux disease)    Gonorrhea 06/15/14    w/tx, TOC- neg   History of anemia    Infection    chlamydia & gonorrhea   Trichomonas    Past Surgical History:  Procedure Laterality Date   fractured finger as a child     LAPAROSCOPIC TUBAL LIGATION Bilateral 03/16/2015   Procedure: LAPAROSCOPIC TUBAL LIGATION WITH FILSHIE CLIPS;  Surgeon: Tereso Newcomer, MD;  Location: [facility];  Service: Gynecology;  Laterality: Bilateral;  With Filshie clips  Requested 03/16/15 @ 1:00p   UPPER GASTROINTESTINAL ENDOSCOPY  02/24/2020   WISDOM TOOTH EXTRACTION         Current Medications, Allergies, Past Medical History, Past Surgical History, Family History and Social History were reviewed in [employer] record.   Review of Systems:   Constitutional: Negative for fever, sweats, chills or weight loss.  Respiratory: Negative for shortness of breath.  Cardiovascular: Negative for chest pain, palpitations and leg swelling.  Gastrointestinal: See HPI.  Musculoskeletal: Negative for back pain or muscle aches.  Neurological: Negative for dizziness, headaches or paresthesias.    Physical Exam: There were no vitals taken for this visit. General: in no acute distress. Head: Normocephalic and atraumatic. Eyes: No scleral icterus. Conjunctiva pink . Ears: Normal auditory acuity. Mouth: Dentition intact. No ulcers or lesions.  Lungs: Clear throughout to auscultation. Heart: Regular rate and rhythm, no murmur. Abdomen: Soft, nontender and nondistended. No masses or hepatomegaly. Normal bowel sounds x 4 quadrants.  Rectal: Deferred.  Musculoskeletal: Symmetrical with no gross deformities. Extremities: No edema. Neurological: Alert oriented x 4. No focal deficits.  Psychological: Alert and cooperative. Normal mood and affect  Assessment and Recommendations: ***

## 2023-03-17 ENCOUNTER — Telehealth: Payer: Self-pay | Admitting: Gastroenterology

## 2023-03-17 NOTE — Telephone Encounter (Signed)
Patient called stated she is having a flare up and has not been able to eat anything, has severe abdominal pain and vomiting since Friday. Please advise.

## 2023-03-17 NOTE — Telephone Encounter (Addendum)
Last seen in 2022. Needs to be seen by a medical provider. Appointment 03/24/23.  No show for appointments 06/01/20, 01/28/23, 02/04/23 Canceled appointments 09/12/20, 01/23/23

## 2023-03-18 ENCOUNTER — Emergency Department (HOSPITAL_BASED_OUTPATIENT_CLINIC_OR_DEPARTMENT_OTHER): Payer: Self-pay

## 2023-03-18 ENCOUNTER — Encounter (HOSPITAL_BASED_OUTPATIENT_CLINIC_OR_DEPARTMENT_OTHER): Payer: Self-pay | Admitting: Emergency Medicine

## 2023-03-18 ENCOUNTER — Emergency Department (HOSPITAL_BASED_OUTPATIENT_CLINIC_OR_DEPARTMENT_OTHER)
Admission: EM | Admit: 2023-03-18 | Discharge: 2023-03-18 | Disposition: A | Discharge status: Home / Self Care | Payer: Self-pay | Attending: Emergency Medicine | Admitting: Emergency Medicine

## 2023-03-18 ENCOUNTER — Other Ambulatory Visit: Payer: Self-pay

## 2023-03-18 DIAGNOSIS — K859 Acute pancreatitis without necrosis or infection, unspecified: Secondary | ICD-10-CM | POA: Insufficient documentation

## 2023-03-18 DIAGNOSIS — R1115 Cyclical vomiting syndrome unrelated to migraine: Secondary | ICD-10-CM

## 2023-03-18 LAB — COMPREHENSIVE METABOLIC PANEL
ALT: 24 U/L (ref 0–44)
AST: 21 U/L (ref 15–41)
Albumin: 5.2 g/dL — ABNORMAL HIGH (ref 3.5–5.0)
Alkaline Phosphatase: 65 U/L (ref 38–126)
Anion gap: 17 — ABNORMAL HIGH (ref 5–15)
BUN: 21 mg/dL — ABNORMAL HIGH (ref 6–20)
CO2: 29 mmol/L (ref 22–32)
Calcium: 10.7 mg/dL — ABNORMAL HIGH (ref 8.9–10.3)
Chloride: 95 mmol/L — ABNORMAL LOW (ref 98–111)
Creatinine, Ser: 0.78 mg/dL (ref 0.44–1.00)
GFR, Estimated: >60 mL/min (ref >60)
Glucose, Bld: 121 mg/dL — ABNORMAL HIGH (ref 70–99)
Potassium: 3.2 mmol/L — ABNORMAL LOW (ref 3.5–5.1)
Sodium: 141 mmol/L (ref 135–145)
Total Bilirubin: 0.8 mg/dL (ref 0.3–1.2)
Total Protein: 9.7 g/dL — ABNORMAL HIGH (ref 6.5–8.1)

## 2023-03-18 LAB — CBC WITH DIFFERENTIAL/PLATELET
Abs Immature Granulocytes: 0.03 10*3/uL (ref 0.00–0.07)
Basophils Absolute: 0 10*3/uL (ref 0.0–0.1)
Basophils Relative: 0 %
Eosinophils Absolute: 0 10*3/uL (ref 0.0–0.5)
Eosinophils Relative: 0 %
HCT: 38.1 % (ref 36.0–46.0)
Hemoglobin: 12.7 g/dL (ref 12.0–15.0)
Immature Granulocytes: 0 %
Lymphocytes Relative: 16 %
Lymphs Abs: 1.8 10*3/uL (ref 0.7–4.0)
MCH: 27.6 pg (ref 26.0–34.0)
MCHC: 33.3 g/dL (ref 30.0–36.0)
MCV: 82.8 fL (ref 80.0–100.0)
Monocytes Absolute: 0.8 10*3/uL (ref 0.1–1.0)
Monocytes Relative: 8 %
Neutro Abs: 8.2 10*3/uL — ABNORMAL HIGH (ref 1.7–7.7)
Neutrophils Relative %: 76 %
Platelets: 420 10*3/uL — ABNORMAL HIGH (ref 150–400)
RBC: 4.6 MIL/uL (ref 3.87–5.11)
RDW: 15 % (ref 11.5–15.5)
WBC: 10.9 10*3/uL — ABNORMAL HIGH (ref 4.0–10.5)
nRBC: 0 % (ref 0.0–0.2)

## 2023-03-18 LAB — RAPID URINE DRUG SCREEN, HOSP PERFORMED
Amphetamines: NOT DETECTED
Barbiturates: NOT DETECTED
Benzodiazepines: NOT DETECTED
Cocaine: NOT DETECTED
Opiates: NOT DETECTED
Tetrahydrocannabinol: POSITIVE — AB

## 2023-03-18 LAB — URINALYSIS, MICROSCOPIC (REFLEX)

## 2023-03-18 LAB — URINALYSIS, ROUTINE W REFLEX MICROSCOPIC
Bilirubin Urine: NEGATIVE
Glucose, UA: NEGATIVE mg/dL
Ketones, ur: 40 mg/dL — AB
Nitrite: NEGATIVE
Protein, ur: >300 mg/dL — AB
Specific Gravity, Urine: 1.015 (ref 1.005–1.030)
pH: 8.5 — ABNORMAL HIGH (ref 5.0–8.0)

## 2023-03-18 LAB — LIPASE, BLOOD: Lipase: 411 U/L — ABNORMAL HIGH (ref 11–51)

## 2023-03-18 LAB — PREGNANCY, URINE: Preg Test, Ur: NEGATIVE

## 2023-03-18 MED ORDER — DROPERIDOL 2.5 MG/ML IJ SOLN
2.5000 mg | Freq: Once | INTRAMUSCULAR | Status: AC
  Administered 2023-03-18: 2.5 mg via INTRAVENOUS
  Filled 2023-03-18: qty 2

## 2023-03-18 MED ORDER — IOHEXOL 300 MG/ML  SOLN
100.0000 mL | Freq: Once | INTRAMUSCULAR | Status: AC | PRN
  Administered 2023-03-18: 80 mL via INTRAVENOUS

## 2023-03-18 MED ORDER — LACTATED RINGERS IV BOLUS
1000.0000 mL | Freq: Once | INTRAVENOUS | Status: AC
  Administered 2023-03-18: 1000 mL via INTRAVENOUS

## 2023-03-18 MED ORDER — PROMETHAZINE HCL 25 MG PO TABS: 25.0000 mg | ORAL_TABLET | Freq: Four times a day (QID) | ORAL | 0 refills | Status: DC | PRN

## 2023-03-18 NOTE — ED Provider Notes (Signed)
EMERGENCY DEPARTMENT AT [facility]  Provider Note  CSN: [id number] Arrival date & time: 03/18/23 0301  History Chief Complaint  Patient presents with   Emesis   Abdominal Pain    [name] [name] is a 32 y.o. female with history of recurrent episodes of vomiting and abdominal pain, has had extensive prior workups including CT, [place], HIDA and endoscopy with no definite cause identified. She reports symptoms returned 3 days ago and have not improved. No blood in emesis. Subjective fever earlier but not measured. She denies alcohol or drug use, had prior marijuana use, but reports none in recent months.    Home Medications Prior to Admission medications   Medication Sig Start Date End Date Taking? Authorizing Provider  ferrous sulfate 325 (65 FE) MG EC tablet Take 1 tablet (325 mg total) by mouth at bedtime. Patient not taking: Reported on 02/24/2020 12/31/19   Zehr, Princella Pellegrini, PA-C  fluconazole (DIFLUCAN) 150 MG tablet Take 1 tablet (150 mg total) by mouth every other day. 03/15/21   Particia Nearing, PA-C  hyoscyamine (LEVSIN SL) 0.125 MG SL tablet Place 1 tablet (0.125 mg total) under the tongue every 12 (twelve) hours as needed for cramping. 02/24/20   Napoleon Form, MD  lidocaine (XYLOCAINE) 2 % solution Use as directed 10 mLs in the mouth or throat as needed for mouth pain. 03/15/21   Particia Nearing, PA-C  omeprazole (PRILOSEC) 20 MG capsule Take 1 capsule (20 mg total) by mouth daily. 01/21/20   Zehr, Shanda Bumps D, PA-C  ondansetron (ZOFRAN-ODT) 4 MG disintegrating tablet Take 1 tablet (4 mg total) by mouth every 8 (eight) hours as needed for nausea or vomiting. 11/26/22   Vanetta Mulders, MD  predniSONE (DELTASONE) 50 MG tablet 1 tablet daily with breakfast for 3 days 03/15/21   Particia Nearing, PA-C  promethazine (PHENERGAN) 25 MG tablet Take 1 tablet (25 mg total) by mouth every 6 (six) hours as needed for up to 7 days for nausea or vomiting.  03/18/23 03/25/23  Pollyann Savoy, MD     Allergies    Patient has no known allergies.   Review of Systems   Review of Systems Please see HPI for pertinent positives and negatives  Physical Exam BP 122/66   Pulse (!) 51   Temp 98.9 F (37.2 C) (Oral)   Resp 16   Ht 5\' 5"  (1.651 m)   Wt 72.6 kg   LMP 02/18/2023 (Approximate)   SpO2 100%   BMI 26.63 kg/m   Physical Exam Vitals and nursing note reviewed.  Constitutional:      Appearance: Normal appearance.  HENT:     Head: Normocephalic and atraumatic.     Nose: Nose normal.     Mouth/Throat:     Mouth: Mucous membranes are moist.  Eyes:     Extraocular Movements: Extraocular movements intact.     Conjunctiva/sclera: Conjunctivae normal.  Cardiovascular:     Rate and Rhythm: Normal rate.  Pulmonary:     Effort: Pulmonary effort is normal.     Breath sounds: Normal breath sounds.  Abdominal:     General: Abdomen is flat.     Palpations: Abdomen is soft.     Tenderness: There is generalized abdominal tenderness. There is no guarding. Negative signs include Murphy's sign and McBurney's sign.  Musculoskeletal:        General: No swelling. Normal range of motion.     Cervical back: Neck supple.  Skin:  General: Skin is warm and dry.  Neurological:     General: No focal deficit present.     Mental Status: She is alert.  Psychiatric:        Mood and Affect: Mood normal.     ED Results / Procedures / Treatments   EKG EKG Interpretation Date/Time:  Tuesday March 18 2023 03:41:48 EDT Ventricular Rate:  82 PR Interval:  100 QRS Duration:  87 QT Interval:  494 QTC Calculation: 578 R Axis:   68  Text Interpretation: Sinus rhythm Short PR interval Nonspecific T abnormalities, lateral leads Prolonged QT interval Since last tracing Rate slower QT has lengthened Confirmed by Susy Frizzle [phone]) on 03/18/2023 4:20:47 AM  Procedures Procedures  Medications Ordered in the ED Medications  lactated ringers  bolus 1,000 mL (0 mLs Intravenous Stopped 03/18/23 0520)  droperidol (INAPSINE) 2.5 MG/ML injection 2.5 mg (2.5 mg Intravenous Given 03/18/23 0333)  iohexol (OMNIPAQUE) 300 MG/ML solution 100 mL (80 mLs Intravenous Contrast Given 03/18/23 0509)    Initial Impression and Plan  Patient here with exacerbation of chronic abdominal pain and vomiting. Exam and vitals are reassuring. Will check labs, given droperidol and IVF and reassess for improvement.   ED Course   Clinical Course as of 03/18/23 0553  Tue Mar 18, 2023  0348 CBC is unremarkable.  [CS]  0419 CMP with mild hypokalemia and hypochloremia, otherwise normal LFTs. Lipase is moderately elevated, above prior baseline raising concern for pancreatitis as cause of her symptoms. Will send for CT to evaluate.  [CS]  0420 EKG with moderately increased QT from previous, this was after droperidol had been given. Will continue to monitor in the ED.  [CS]  0457 HCG is neg. UA with some blood and protein, but no signs of infection.  [CS]  0506 UDS is positive for THC.  [CS]  [id number] Patient reports symptoms are significantly improved.  [CS]  [phone] I personally viewed the images from radiology studies and agree with radiologist interpretation: CT is neg for acute process, including signs of pancreatitis. Patient is feeling well and is eager to go home. Requesting a refill of her phenergan. She is scheduled to see GI next week. RTED for any other concerns.  [CS]    Clinical Course User Index [CS] Pollyann Savoy, MD     MDM Rules/Calculators/A&P Medical Decision Making Problems Addressed: Acute pancreatitis without infection or necrosis, unspecified pancreatitis type: acute illness or injury Cyclic vomiting syndrome: chronic illness or injury with exacerbation, progression, or side effects of treatment  Amount and/or Complexity of Data Reviewed Labs: ordered. Decision-making details documented in ED Course. Radiology: ordered and independent  interpretation performed. Decision-making details documented in ED Course.  Risk Prescription drug management.     Final Clinical Impression(s) / ED Diagnoses Final diagnoses:  Cyclic vomiting syndrome  Acute pancreatitis without infection or necrosis, unspecified pancreatitis type    Rx / DC Orders ED Discharge Orders          Ordered    promethazine (PHENERGAN) 25 MG tablet  Every 6 hours PRN        03/18/23 0552             Pollyann Savoy, MD 03/18/23 [phone]

## 2023-03-18 NOTE — Telephone Encounter (Signed)
Called patient. No answer. Left message of my call. Still trying to get her an appointment for evaluation.

## 2023-03-18 NOTE — ED Triage Notes (Signed)
Pt reports upper mid abd pain with emesis since Friday, reports fever, relieved with Tylenol

## 2023-03-21 ENCOUNTER — Other Ambulatory Visit: Payer: Self-pay

## 2023-03-24 ENCOUNTER — Encounter: Payer: Self-pay | Admitting: Gastroenterology

## 2023-03-24 ENCOUNTER — Ambulatory Visit (INDEPENDENT_AMBULATORY_CARE_PROVIDER_SITE_OTHER): Payer: Self-pay | Admitting: Gastroenterology

## 2023-03-24 ENCOUNTER — Other Ambulatory Visit (INDEPENDENT_AMBULATORY_CARE_PROVIDER_SITE_OTHER): Payer: Self-pay

## 2023-03-24 ENCOUNTER — Ambulatory Visit (INDEPENDENT_AMBULATORY_CARE_PROVIDER_SITE_OTHER)
Admission: RE | Admit: 2023-03-24 | Discharge: 2023-03-24 | Disposition: A | Discharge status: Home / Self Care | Payer: Self-pay | Source: Ambulatory Visit | Attending: Gastroenterology | Admitting: Gastroenterology

## 2023-03-24 VITALS — BP 118/78 | HR 126 | Ht 65.0 in | Wt 164.0 lb

## 2023-03-24 DIAGNOSIS — K828 Other specified diseases of gallbladder: Secondary | ICD-10-CM

## 2023-03-24 DIAGNOSIS — R112 Nausea with vomiting, unspecified: Secondary | ICD-10-CM

## 2023-03-24 DIAGNOSIS — R1013 Epigastric pain: Secondary | ICD-10-CM

## 2023-03-24 DIAGNOSIS — K219 Gastro-esophageal reflux disease without esophagitis: Secondary | ICD-10-CM

## 2023-03-24 DIAGNOSIS — K5909 Other constipation: Secondary | ICD-10-CM

## 2023-03-24 LAB — CBC WITH DIFFERENTIAL/PLATELET
Basophils Absolute: 0.1 10*3/uL (ref 0.0–0.1)
Basophils Relative: 0.6 % (ref 0.0–3.0)
Eosinophils Absolute: 0.1 10*3/uL (ref 0.0–0.7)
Eosinophils Relative: 0.6 % (ref 0.0–5.0)
HCT: 40.8 % (ref 36.0–46.0)
Hemoglobin: 13.3 g/dL (ref 12.0–15.0)
Lymphocytes Relative: 27.2 % (ref 12.0–46.0)
Lymphs Abs: 3.1 10*3/uL (ref 0.7–4.0)
MCHC: 32.6 g/dL (ref 30.0–36.0)
MCV: 84.7 fl (ref 78.0–100.0)
Monocytes Absolute: 1 10*3/uL (ref 0.1–1.0)
Monocytes Relative: 8.6 % (ref 3.0–12.0)
Neutro Abs: 7.1 10*3/uL (ref 1.4–7.7)
Neutrophils Relative %: 63 % (ref 43.0–77.0)
Platelets: 483 10*3/uL — ABNORMAL HIGH (ref 150.0–400.0)
RBC: 4.82 Mil/uL (ref 3.87–5.11)
RDW: 15.8 % — ABNORMAL HIGH (ref 11.5–15.5)
WBC: 11.2 10*3/uL — ABNORMAL HIGH (ref 4.0–10.5)

## 2023-03-24 LAB — COMPREHENSIVE METABOLIC PANEL
ALT: 14 U/L (ref 0–35)
AST: 13 U/L (ref 0–37)
Albumin: 5 g/dL (ref 3.5–5.2)
Alkaline Phosphatase: 65 U/L (ref 39–117)
BUN: 14 mg/dL (ref 6–23)
CO2: 30 mEq/L (ref 19–32)
Calcium: 10.4 mg/dL (ref 8.4–10.5)
Chloride: 87 mEq/L — ABNORMAL LOW (ref 96–112)
Creatinine, Ser: 0.98 mg/dL (ref 0.40–1.20)
GFR: 77.81 mL/min (ref >60.00)
Glucose, Bld: 143 mg/dL — ABNORMAL HIGH (ref 70–99)
Potassium: 3.3 mEq/L — ABNORMAL LOW (ref 3.5–5.1)
Sodium: 133 mEq/L — ABNORMAL LOW (ref 135–145)
Total Bilirubin: 0.8 mg/dL (ref 0.2–1.2)
Total Protein: 8.8 g/dL — ABNORMAL HIGH (ref 6.0–8.3)

## 2023-03-24 LAB — LIPASE: Lipase: 29 U/L (ref 11.0–59.0)

## 2023-03-24 MED ORDER — PANTOPRAZOLE SODIUM 40 MG PO TBEC: 40.0000 mg | DELAYED_RELEASE_TABLET | Freq: Every day | ORAL | 3 refills | Status: DC

## 2023-03-24 NOTE — Patient Instructions (Signed)
Your provider has requested that you have an abdominal x ray before leaving today. Please go to the basement floor to our Radiology department for the test.   Your provider has requested that you go to the basement level for lab work before leaving today. Press "B" on the elevator. The lab is located at the first door on the left as you exit the elevator.   We have sent the following medications to your pharmacy for you to pick up at your convenience: Pantoprazole  STOP Marijuana  We will refer you to [facility] for consult for your Gallbladder  Follow up per recommendations after seen by [facility]    Due to recent changes in healthcare laws, you may see the results of your imaging and laboratory studies on MyChart before your provider has had a chance to review them.  We understand that in some cases there may be results that are confusing or concerning to you. Not all laboratory results come back in the same time frame and the provider may be waiting for multiple results in order to interpret others.  Please give [place] 48 hours in order for your provider to thoroughly review all the results before contacting the office for clarification of your results.    I appreciate the  opportunity to care for you  Thank You   [facility]

## 2023-03-24 NOTE — Progress Notes (Signed)
Chief Complaint: Nausea/vomiting/abdominal pain Primary GI MD: Dr. Lavon Paganini  HPI: 32 year old female with history of recurrent episodes of vomiting and abdominal pain presents for same.  Has had multiple emergency department visits for same.  Has had extensive workup including CT, ultrasound, HIDA, and endoscopy with no definite cause.  History of marijuana use.  Recently seen in emergency department 03/18/23 CT abdomen pelvis with contrast 03/18/2023 shows no acute findings.  Negative for pancreatitis.  Small nonobstructive calculi Lipase 411 Normal LFTs Leukocytosis with WBC 10.9 UDA positive for THC  Last seen in 2021 and at that time she was having the same symptoms.  Patient states she has "flares" where she has severe epigastric/RUQ pain worse with eating and associated nausea and vomiting. She will wake up and have nausea/vomiting. Reports it gets better with a hot shower. Denies NSAID use, alcohol use, tobacco use. Reports severe GERD even with water.  She states she has not had a bowel movement in 11 days and is not passing gas. Denies melena/hematochezia.   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 pylori -Flattened mucosa in the duodenum, biopsied - negative celiac   Past Medical History:  Diagnosis Date   Allergy    Anemia    Chlamydia    06/15/14 w/tx, 10/11/14 w/tx, TOC-neg x2   Eczema    GERD (gastroesophageal reflux disease)    Gonorrhea 06/15/14   w/tx, TOC- neg   History of anemia    Infection    chlamydia & gonorrhea   Trichomonas     Past Surgical History:  Procedure Laterality Date   fractured finger as a child     LAPAROSCOPIC TUBAL LIGATION Bilateral 03/16/2015   Procedure: LAPAROSCOPIC TUBAL LIGATION WITH FILSHIE CLIPS;  Surgeon: Tereso Newcomer, MD;  Location: [facility];  Service: Gynecology;  Laterality:  Bilateral;  With Filshie clips  Requested 03/16/15 @ 1:00p   UPPER GASTROINTESTINAL ENDOSCOPY  02/24/2020   WISDOM TOOTH EXTRACTION      Current Outpatient Medications  Medication Sig Dispense Refill   ferrous sulfate 325 (65 FE) MG EC tablet Take 1 tablet (325 mg total) by mouth at bedtime. (Patient not taking: Reported on 02/24/2020)  3   fluconazole (DIFLUCAN) 150 MG tablet Take 1 tablet (150 mg total) by mouth every other day. 3 tablet 0   hyoscyamine (LEVSIN SL) 0.125 MG SL tablet Place 1 tablet (0.125 mg total) under the tongue every 12 (twelve) hours as needed for cramping. 60 tablet 1   lidocaine (XYLOCAINE) 2 % solution Use as directed 10 mLs in the mouth or throat as needed for mouth pain. 100 mL 0   omeprazole (PRILOSEC) 20 MG capsule Take 1 capsule (20 mg total) by mouth daily. 30 capsule 1   ondansetron (ZOFRAN-ODT) 4 MG disintegrating tablet Take 1 tablet (4 mg total) by mouth every 8 (eight) hours as needed for nausea or vomiting. 12 tablet 1   predniSONE (DELTASONE) 50 MG tablet 1 tablet daily with breakfast for 3 days 3 tablet 0   promethazine (PHENERGAN) 25 MG tablet Take 1 tablet (25 mg total) by mouth every 6 (six) hours as needed for up to 7 days for nausea or vomiting. 30 tablet 0   No current facility-administered medications for this visit.    Allergies as of 03/24/2023   (No Known Allergies)    Family History  Problem Relation Age of Onset   Asthma Sister    Heart disease Maternal Grandmother    Other Neg Hx    Colon cancer Neg Hx    Colon polyps Neg Hx    Esophageal cancer Neg Hx    Rectal cancer Neg Hx    Stomach cancer Neg Hx     Social History   Socioeconomic History   Marital status: Single    Spouse name: Not on file   Number of children: Not on file   Years of education: Not on file   Highest education level: Not on file  Occupational History   Not on file  Tobacco Use   Smoking status: Never   Smokeless tobacco: Never  Vaping Use    Vaping status: Never Used  Substance and Sexual Activity   Alcohol use: No   Drug use: No   Sexual activity: Not on file  Other Topics Concern   Not on file  Social History Narrative   Not on file   Social Determinants of Health   Financial Resource Strain: Not on file  Food Insecurity: Not on file  Transportation Needs: Not on file  Physical Activity: Not on file  Stress: Not on file  Social Connections: Not on file  Intimate Partner Violence: Not on file    Review of Systems:    Constitutional: No weight loss, fever, chills, weakness or fatigue HEENT: Eyes: No change in vision               Ears, Nose, Throat:  No change in hearing or congestion Skin: No rash or itching Cardiovascular: No chest pain, chest pressure or palpitations   Respiratory: No SOB or cough Gastrointestinal: See HPI and otherwise negative Genitourinary: No dysuria or change in urinary frequency Neurological: No headache, dizziness or syncope Musculoskeletal: No new muscle or joint pain Hematologic: No bleeding or bruising Psychiatric: No history of depression or anxiety    Physical Exam:  Vital signs: LMP 02/18/2023 (Approximate)   Constitutional: NAD, Well developed, Well nourished, alert and cooperative Head:  Normocephalic and atraumatic. Eyes:   PEERL, EOMI. No icterus. Conjunctiva pink. Respiratory: Respirations even and unlabored. Lungs clear to auscultation bilaterally.   No wheezes, crackles, or rhonchi.  Cardiovascular:  Regular rate and rhythm. No peripheral edema, cyanosis or pallor.  Gastrointestinal:  soft, non distended, exquisite epigastric tenderness, hypoactive bowel sounds Rectal:  Not performed.  Msk:  Symmetrical without gross deformities. Without edema, no deformity or joint abnormality.  Neurologic:  Alert and  oriented x4;  grossly normal neurologically.  Skin:   Dry and intact without significant lesions or rashes. Psychiatric: Oriented to person, place and time.  Demonstrates good judgement and reason without abnormal affect or behaviors.   RELEVANT LABS AND IMAGING: CBC    Component Value Date/Time   WBC 10.9 (H) 03/18/2023 0321   RBC 4.60 03/18/2023 0321   HGB 12.7 03/18/2023 0321   HGB 8.7 08/09/2013 0000   HCT 38.1 03/18/2023 0321   HCT 26 08/09/2013 0000   PLT 420 (H) 03/18/2023 0321   PLT 220 08/09/2013 0000   MCV 82.8 03/18/2023 0321   MCH 27.6 03/18/2023 0321   MCHC 33.3 03/18/2023 0321   RDW 15.0 03/18/2023 0321   LYMPHSABS 1.8 03/18/2023 0321   MONOABS 0.8 03/18/2023 0321   EOSABS 0.0 03/18/2023 0321   BASOSABS 0.0 03/18/2023 0321    CMP     Component Value Date/Time   NA 141 03/18/2023 0321  K 3.2 (L) 03/18/2023 0321   CL 95 (L) 03/18/2023 0321   CO2 29 03/18/2023 0321   GLUCOSE 121 (H) 03/18/2023 0321   BUN 21 (H) 03/18/2023 0321   CREATININE 0.78 03/18/2023 0321   CALCIUM 10.7 (H) 03/18/2023 0321   PROT 9.7 (H) 03/18/2023 0321   ALBUMIN 5.2 (H) 03/18/2023 0321   AST 21 03/18/2023 0321   ALT 24 03/18/2023 0321   ALKPHOS 65 03/18/2023 0321   BILITOT 0.8 03/18/2023 0321   GFRNONAA >60 03/18/2023 0321   GFRAA >60 12/22/2019 1944     Assessment/Plan:   Nausea and vomiting, unspecified vomiting type Abdominal pain, epigastric Multifactorial - possibly from GERD, gastritis, biliary dyskinesia. Suspect most likely culprit is history of marijuana use. Ddx also includes cyclical vomiting syndrome. However, with presence of severe pain and no flatus or bowel movement will need to r/o obstruction. (recent CT negative for pancreatitis) - CBC/CMP/Lipase - abdominal xray to r/o obstruction - [facility] referral for biliary dyskinesia - advised complete cessation of marijuana use. Discussed cannaboid hyperemesis syndrome - trial of Protonix  Gastroesophageal reflux disease, unspecified whether esophagitis present Uncontrolled. History of h. Pylori negative gastritis - Protonix 40mg  once daily for 30 days - educated patient  on lifestyle modifications  Other constipation Severe abdominal pain, no BM in 11 days, not passing flatus. Recent CT negative for obstruction. Hypoactive bowel sounds on exam. Obstruction is less likely but cannot r/o - KUB to r/o obstruction - Start taking Miralax 1 capful (17 grams) 1x / day for 1 week.   If this is not effective, increase to 1 dose 2x / day for 1 week.   If this is still not effective, increase to two capfuls (34 grams) 2x / day.   Can adjust dose as needed based on response. Can take 1/2 cap daily, skip days, or increase per day.   - If worsening pain, please proceed to ED  Biliary dyskinesia HIDA with EF of 43% in 2021. Suspect symptoms becoming more progressive could be development of biliary dyskinesia and decreased EF. May benefit from cholecystectomy since symptoms are associated with eating. - refer to [facility] for further evaluation of possible cholecystectomy.    Boone Master, PA-C [facility] 03/24/2023, 8:09 AM  Cc: No ref. provider found

## 2023-03-26 ENCOUNTER — Telehealth: Payer: Self-pay | Admitting: *Deleted

## 2023-03-26 NOTE — Telephone Encounter (Signed)
Per [facility] patient needs a referral to [facility] for consult for Cholecystectomy. Faxed in office notes and demographics to [facility] this morning

## 2023-04-02 ENCOUNTER — Ambulatory Visit: Payer: Self-pay | Admitting: Physician Assistant

## 2023-04-16 ENCOUNTER — Telehealth: Payer: Self-pay | Admitting: Gastroenterology

## 2023-04-16 MED ORDER — PROMETHAZINE HCL 25 MG PO TABS: ORAL_TABLET | ORAL | 0 refills | Status: DC

## 2023-04-16 NOTE — Telephone Encounter (Signed)
Called [facility] to follow up, They said they did not have referral. I have fax ok from 7/31. I refaxed today

## 2023-04-16 NOTE — Telephone Encounter (Signed)
Called patient back to let her know Dr Lavon Paganini ok'd for me to send her in Promethazine. #30 0 refills  I also informed patient that [facility] is still working on her referral for cholecystectomy. She should be hearing from them soon

## 2023-04-16 NOTE — Telephone Encounter (Signed)
PT wants to know if she can get a script for promethazine even though it was originally prescribed by another physician. Please advise.

## 2023-04-16 NOTE — Telephone Encounter (Signed)
Dr Lavon Paganini can patient have a script of promethazine? Patient was seen by [facility] on 7/29

## 2023-04-16 NOTE — Telephone Encounter (Signed)
Okay to give promethazine 25 mg daily as needed, 30 tabs with no refills.

## 2023-07-21 NOTE — Telephone Encounter (Signed)
I called [facility] because I still have patients referral on my desk, The patient was scheduled on 05/17/2023 and she no showed the appointment.

## 2023-07-22 ENCOUNTER — Telehealth: Payer: Self-pay | Admitting: Gastroenterology

## 2023-07-22 NOTE — Telephone Encounter (Signed)
Patient calls back. She did not go to the ER. Tells me she hasn't had time, she is in [place], not [employer]. Encouraged her to go to the local ER if she was not improving, still in pain , unable to retain hydration. She said she would try to go this evening. Offered to schedule her for follow up next week on Wednesday at 2:30 pm. Call disconnected.

## 2023-07-22 NOTE — Telephone Encounter (Signed)
Called the patient. No answer. Left a message of my call. Asked she call back and ask for me.

## 2023-07-22 NOTE — Telephone Encounter (Signed)
Patient called the after-hours line around 5 AM this morning.  She is complaining of severe upper abdominal pain, nausea and vomiting, typical of her episodes that she has had like this in recent years.  I see that she has been seen in the office for these issues, last seen in July.  She has had a pretty good workup with ultrasound, CT scan, EGD.  HIDA showed an EF of 43%, she was referred to [facility] to get her gallbladder removed.  She has been feeling poorly for a few days now, scared she is dehydrated, has not been able to tolerate anything p.o.  She is very tearful on the phone and in a significant amount of pain.  She does not have any antiemetics at home, using Protonix.  I see that she was previously referred to [facility] to discuss cholecystectomy, she states she was not able to keep that appointment because she could not afford it.  She asked about options at this hour.  I told her our office is not open until 8 AM.  I can give her antiemetics and draw labs etc. this morning, however I think she probably warrants IV fluids, sounds dehydrated, and needs her pain addressed.  Given the severity of her symptoms that she describes and I recommend she go to the ED for fluids and further evaluation.  She agreed.  I told her I would relay this to our office to coordinate some follow-up.     Beth, FYI, 1 of Dr. Elana Alm patients.  Can you book follow-up with her or APP and check up on her sometime today.  She should be given some Zofran or antiemetics, assuming the ED will do this but want to make sure.  Thanks

## 2023-07-30 ENCOUNTER — Ambulatory Visit: Payer: Self-pay | Admitting: Gastroenterology

## 2023-11-04 ENCOUNTER — Emergency Department (HOSPITAL_BASED_OUTPATIENT_CLINIC_OR_DEPARTMENT_OTHER)
Admission: EM | Admit: 2023-11-04 | Discharge: 2023-11-04 | Disposition: A | Discharge status: Home / Self Care | Payer: Self-pay | Attending: Emergency Medicine | Admitting: Emergency Medicine

## 2023-11-04 ENCOUNTER — Other Ambulatory Visit (HOSPITAL_BASED_OUTPATIENT_CLINIC_OR_DEPARTMENT_OTHER): Payer: Self-pay

## 2023-11-04 ENCOUNTER — Encounter (HOSPITAL_BASED_OUTPATIENT_CLINIC_OR_DEPARTMENT_OTHER): Payer: Self-pay | Admitting: Emergency Medicine

## 2023-11-04 ENCOUNTER — Other Ambulatory Visit: Payer: Self-pay

## 2023-11-04 DIAGNOSIS — R112 Nausea with vomiting, unspecified: Secondary | ICD-10-CM | POA: Insufficient documentation

## 2023-11-04 DIAGNOSIS — F1290 Cannabis use, unspecified, uncomplicated: Secondary | ICD-10-CM | POA: Insufficient documentation

## 2023-11-04 LAB — COMPREHENSIVE METABOLIC PANEL
ALT: 20 U/L (ref 0–44)
AST: 15 U/L (ref 15–41)
Albumin: 5.5 g/dL — ABNORMAL HIGH (ref 3.5–5.0)
Alkaline Phosphatase: 61 U/L (ref 38–126)
Anion gap: 14 (ref 5–15)
BUN: 17 mg/dL (ref 6–20)
CO2: 32 mmol/L (ref 22–32)
Calcium: 10.5 mg/dL — ABNORMAL HIGH (ref 8.9–10.3)
Chloride: 97 mmol/L — ABNORMAL LOW (ref 98–111)
Creatinine, Ser: 0.77 mg/dL (ref 0.44–1.00)
GFR, Estimated: >60 mL/min (ref >60)
Glucose, Bld: 111 mg/dL — ABNORMAL HIGH (ref 70–99)
Potassium: 3.2 mmol/L — ABNORMAL LOW (ref 3.5–5.1)
Sodium: 143 mmol/L (ref 135–145)
Total Bilirubin: 0.7 mg/dL (ref 0.0–1.2)
Total Protein: 9.4 g/dL — ABNORMAL HIGH (ref 6.5–8.1)

## 2023-11-04 LAB — CBC WITH DIFFERENTIAL/PLATELET
Abs Immature Granulocytes: 0.03 10*3/uL (ref 0.00–0.07)
Basophils Absolute: 0 10*3/uL (ref 0.0–0.1)
Basophils Relative: 0 %
Eosinophils Absolute: 0 10*3/uL (ref 0.0–0.5)
Eosinophils Relative: 0 %
HCT: 39.6 % (ref 36.0–46.0)
Hemoglobin: 13.3 g/dL (ref 12.0–15.0)
Immature Granulocytes: 0 %
Lymphocytes Relative: 18 %
Lymphs Abs: 1.9 10*3/uL (ref 0.7–4.0)
MCH: 30.2 pg (ref 26.0–34.0)
MCHC: 33.6 g/dL (ref 30.0–36.0)
MCV: 90 fL (ref 80.0–100.0)
Monocytes Absolute: 0.8 10*3/uL (ref 0.1–1.0)
Monocytes Relative: 8 %
Neutro Abs: 7.5 10*3/uL (ref 1.7–7.7)
Neutrophils Relative %: 74 %
Platelets: 429 10*3/uL — ABNORMAL HIGH (ref 150–400)
RBC: 4.4 MIL/uL (ref 3.87–5.11)
RDW: 14.2 % (ref 11.5–15.5)
WBC: 10.4 10*3/uL (ref 4.0–10.5)
nRBC: 0 % (ref 0.0–0.2)

## 2023-11-04 LAB — URINALYSIS, ROUTINE W REFLEX MICROSCOPIC
Bilirubin Urine: NEGATIVE
Glucose, UA: NEGATIVE mg/dL
Ketones, ur: 40 mg/dL — AB
Nitrite: NEGATIVE
Protein, ur: 100 mg/dL — AB
RBC / HPF: >50 RBC/hpf (ref 0–5)
Specific Gravity, Urine: 1.022 (ref 1.005–1.030)
pH: 8.5 — ABNORMAL HIGH (ref 5.0–8.0)

## 2023-11-04 LAB — RESP PANEL BY RT-PCR (RSV, FLU A&B, COVID)  RVPGX2
Influenza A by PCR: NEGATIVE
Influenza B by PCR: NEGATIVE
Resp Syncytial Virus by PCR: NEGATIVE
SARS Coronavirus 2 by RT PCR: NEGATIVE

## 2023-11-04 LAB — RAPID URINE DRUG SCREEN, HOSP PERFORMED
Amphetamines: NOT DETECTED
Barbiturates: NOT DETECTED
Benzodiazepines: NOT DETECTED
Cocaine: NOT DETECTED
Opiates: NOT DETECTED
Tetrahydrocannabinol: POSITIVE — AB

## 2023-11-04 LAB — LIPASE, BLOOD: Lipase: 251 U/L — ABNORMAL HIGH (ref 11–51)

## 2023-11-04 MED ORDER — PANTOPRAZOLE SODIUM 20 MG PO TBEC
20.0000 mg | DELAYED_RELEASE_TABLET | Freq: Every day | ORAL | 0 refills | Status: DC
  Filled 2023-11-04: qty 14, 14d supply, fill #0

## 2023-11-04 MED ORDER — SUCRALFATE 1 G PO TABS
1.0000 g | ORAL_TABLET | Freq: Three times a day (TID) | ORAL | 0 refills | Status: DC
  Filled 2023-11-04: qty 21, 7d supply, fill #0

## 2023-11-04 MED ORDER — MAGNESIUM SULFATE 2 GM/50ML IV SOLN
2.0000 g | Freq: Once | INTRAVENOUS | Status: AC
Start: 2023-11-04 — End: 2023-11-04
  Administered 2023-11-04: 2 g via INTRAVENOUS
  Filled 2023-11-04: qty 50

## 2023-11-04 MED ORDER — PANTOPRAZOLE SODIUM 40 MG IV SOLR
40.0000 mg | Freq: Once | INTRAVENOUS | Status: AC
  Administered 2023-11-04: 40 mg via INTRAVENOUS
  Filled 2023-11-04: qty 10

## 2023-11-04 MED ORDER — SODIUM CHLORIDE 0.9 % IV BOLUS
1000.0000 mL | Freq: Once | INTRAVENOUS | Status: AC
  Administered 2023-11-04: 1000 mL via INTRAVENOUS

## 2023-11-04 MED ORDER — DROPERIDOL 2.5 MG/ML IJ SOLN
2.5000 mg | Freq: Once | INTRAMUSCULAR | Status: AC
  Administered 2023-11-04: 2.5 mg via INTRAVENOUS
  Filled 2023-11-04: qty 2

## 2023-11-04 MED ORDER — ONDANSETRON HCL 4 MG/2ML IJ SOLN: 4.0000 mg | Freq: Once | INTRAMUSCULAR | Status: DC

## 2023-11-04 MED ORDER — DIPHENHYDRAMINE HCL 50 MG/ML IJ SOLN
25.0000 mg | Freq: Once | INTRAMUSCULAR | Status: AC
  Administered 2023-11-04: 25 mg via INTRAVENOUS
  Filled 2023-11-04: qty 1

## 2023-11-04 MED ORDER — ZIKS ARTHRITIS PAIN RELIEF 0.025-1-12 % EX CREA
1.0000 | TOPICAL_CREAM | Freq: Two times a day (BID) | CUTANEOUS | 0 refills | Status: AC | PRN
  Filled 2023-11-04: qty 56.6, fill #0

## 2023-11-04 NOTE — ED Provider Notes (Signed)
 [place] EMERGENCY DEPARTMENT AT [facility] Provider Note  CSN: [id number] Arrival date & time: 11/04/23 0745  Chief Complaint(s) Abdominal Pain  HPI [name] [name] is a 33 y.o. female with past medical history as below, significant for cyclical vomiting, gastritis, STI, gerd who presents to the ED with complaint of abd pain, n/v.  Tubal ligation 2016 EGD 7/21 Nandigam, gastritis presumed    Patient reports ongoing epigastric burning, nausea, vomiting, She was seen at [facility] 3/9 for the same, had stable workup, CT pelvis imaging was reassuring, she was discharged in stable condition.  Feels her symptoms have not resolved since then.  Nausea and vomiting, epigastric discomfort despite taking Zofran and Bentyl.  Last dose of Zofran was last night.  She has history of cyclical vomiting syndrome, she denies THC but there is a very strong odor of THC in the room.  She reports symptoms improved with hot showers.  No fevers, chills, rashes, jaundice,.  Dysuria, no vaginal bleeding, no BRBPR or melena, emesis nonbloody nonbilious.  No chest pain or dyspnea.  Past Medical History Past Medical History:  Diagnosis Date   Allergy    Anemia    Chlamydia    06/15/14 w/tx, 10/11/14 w/tx, TOC-neg x2   Eczema    GERD (gastroesophageal reflux disease)    Gonorrhea 06/15/14   w/tx, TOC- neg   History of anemia    Infection    chlamydia & gonorrhea   Trichomonas    Patient Active Problem List   Diagnosis Date Noted   Chronic gastritis without bleeding 03/04/2020   Colitis 03/04/2020   Hypokalemia 03/04/2020   Menorrhagia with regular cycle 03/04/2020   Nausea and vomiting 12/31/2019   Epigastric pain 12/31/2019   Iron deficiency anemia 12/31/2019   Encounter for female sterilization procedure    Home Medication(s) Prior to Admission medications   Medication Sig Start Date End Date Taking? Authorizing Provider  Capsaicin-Menthol-Methyl Sal (CAPSAICIN-METHYL SAL-MENTHOL)  0.025-1-12 % CREA Apply 1 Application topically 2 (two) times daily as needed. 11/04/23  Yes Tanda Rockers A, DO  pantoprazole (PROTONIX) 20 MG tablet Take 1 tablet (20 mg total) by mouth daily for 14 days. 11/04/23 11/18/23 Yes Tanda Rockers A, DO  sucralfate (CARAFATE) 1 g tablet Take 1 tablet (1 g total) by mouth with breakfast, with lunch, and with evening meal for 7 days. 11/04/23 11/11/23 Yes Tanda Rockers A, DO  ferrous sulfate 325 (65 FE) MG EC tablet Take 1 tablet (325 mg total) by mouth at bedtime. 12/31/19   Zehr, Princella Pellegrini, PA-C  fluconazole (DIFLUCAN) 150 MG tablet Take 1 tablet (150 mg total) by mouth every other day. 03/15/21   Particia Nearing, PA-C  hyoscyamine (LEVSIN SL) 0.125 MG SL tablet Place 1 tablet (0.125 mg total) under the tongue every 12 (twelve) hours as needed for cramping. 02/24/20   Napoleon Form, MD  lidocaine (XYLOCAINE) 2 % solution Use as directed 10 mLs in the mouth or throat as needed for mouth pain. 03/15/21   Particia Nearing, PA-C  ondansetron (ZOFRAN-ODT) 4 MG disintegrating tablet Take 1 tablet (4 mg total) by mouth every 8 (eight) hours as needed for nausea or vomiting. 11/26/22   Vanetta Mulders, MD  predniSONE (DELTASONE) 50 MG tablet 1 tablet daily with breakfast for 3 days 03/15/21   Particia Nearing, PA-C  promethazine [facility]) 25 MG tablet Take one tablet by mouth daily as needed 04/16/23   Napoleon Form, MD  Past Surgical History Past Surgical History:  Procedure Laterality Date   fractured finger as a child     LAPAROSCOPIC TUBAL LIGATION Bilateral 03/16/2015   Procedure: LAPAROSCOPIC TUBAL LIGATION WITH FILSHIE CLIPS;  Surgeon: Tereso Newcomer, MD;  Location: [facility];  Service: Gynecology;  Laterality: Bilateral;  With Filshie clips  Requested 03/16/15 @ 1:00p   UPPER GASTROINTESTINAL ENDOSCOPY   02/24/2020   WISDOM TOOTH EXTRACTION     Family History Family History  Problem Relation Age of Onset   Asthma Sister    Heart disease Maternal Grandmother    Other Neg Hx    Colon cancer Neg Hx    Colon polyps Neg Hx    Esophageal cancer Neg Hx    Rectal cancer Neg Hx    Stomach cancer Neg Hx     Social History Social History   Tobacco Use   Smoking status: Never   Smokeless tobacco: Never  Vaping Use   Vaping status: Never Used  Substance Use Topics   Alcohol use: No   Drug use: Yes    Comment: THC   Allergies Patient has no known allergies.  Review of Systems A thorough review of systems was obtained and all systems are negative except as noted in the HPI and PMH.   Physical Exam Vital Signs  I have reviewed the triage vital signs BP 136/66   Pulse 69   Temp 97.9 F (36.6 C) (Oral)   Resp 19   Wt 81.6 kg   LMP 10/27/2023   SpO2 97%   BMI 29.95 kg/m  Physical Exam Vitals and nursing note reviewed.  Constitutional:      General: She is not in acute distress.    Appearance: Normal appearance. She is well-developed.     Comments: Strong odor of thc in the room  HENT:     Head: Normocephalic and atraumatic.     Right Ear: External ear normal.     Left Ear: External ear normal.     Nose: Nose normal.     Mouth/Throat:     Mouth: Mucous membranes are dry.  Eyes:     General: No scleral icterus.       Right eye: No discharge.        Left eye: No discharge.  Cardiovascular:     Rate and Rhythm: Regular rhythm. Bradycardia present.     Pulses: Normal pulses.     Heart sounds: Normal heart sounds.  Pulmonary:     Effort: Pulmonary effort is normal. No respiratory distress.     Breath sounds: Normal breath sounds. No stridor.  Abdominal:     General: Abdomen is flat. There is no distension.     Palpations: Abdomen is soft.     Tenderness: There is abdominal tenderness in the epigastric area.  Musculoskeletal:     Cervical back: No rigidity.      Right lower leg: No edema.     Left lower leg: No edema.  Skin:    General: Skin is warm and dry.     Capillary Refill: Capillary refill takes less than 2 seconds.  Neurological:     Mental Status: She is alert.  Psychiatric:        Mood and Affect: Mood normal.        Behavior: Behavior normal. Behavior is cooperative.     ED Results and Treatments Labs (all labs ordered are listed, but only abnormal results are displayed) Labs Reviewed  CBC WITH DIFFERENTIAL/PLATELET -  Abnormal; Notable for the following components:      Result Value   Platelets 429 (*)    All other components within normal limits  COMPREHENSIVE METABOLIC PANEL - Abnormal; Notable for the following components:   Potassium 3.2 (*)    Chloride 97 (*)    Glucose, Bld 111 (*)    Calcium 10.5 (*)    Total Protein 9.4 (*)    Albumin 5.5 (*)    All other components within normal limits  LIPASE, BLOOD - Abnormal; Notable for the following components:   Lipase 251 (*)    All other components within normal limits  URINALYSIS, ROUTINE W REFLEX MICROSCOPIC - Abnormal; Notable for the following components:   APPearance HAZY (*)    pH 8.5 (*)    Hgb urine dipstick SMALL (*)    Ketones, ur 40 (*)    Protein, ur 100 (*)    Leukocytes,Ua SMALL (*)    Bacteria, UA RARE (*)    All other components within normal limits  RAPID URINE DRUG SCREEN, HOSP PERFORMED - Abnormal; Notable for the following components:   Tetrahydrocannabinol POSITIVE (*)    All other components within normal limits  RESP PANEL BY RT-PCR (RSV, FLU A&B, COVID)  RVPGX2                                                                                                                          Radiology No results found.  Pertinent labs & imaging results that were available during my care of the patient were reviewed by me and considered in my medical decision making (see MDM for details).  Medications Ordered in ED Medications  sodium chloride 0.9 %  bolus 1,000 mL (0 mLs Intravenous Stopped 11/04/23 0951)  pantoprazole (PROTONIX) injection 40 mg (40 mg Intravenous Given 11/04/23 0821)  droperidol (INAPSINE) 2.5 MG/ML injection 2.5 mg (2.5 mg Intravenous Given 11/04/23 1610)  magnesium sulfate IVPB 2 g 50 mL (0 g Intravenous Stopped 11/04/23 0951)  diphenhydrAMINE (BENADRYL) injection 25 mg (25 mg Intravenous Given 11/04/23 0827)                                                                                                                                     Procedures Procedures  (including critical care time)  Medical Decision Making / ED Course    Medical Decision Making:    Shanena S  Mcmurtrey is a 33 y.o. female  with past medical history as below, significant for cyclical vomiting, gastritis, STI, gerd who presents to the ED with complaint of abd pain, n/v. The complaint involves an extensive differential diagnosis and also carries with it a high risk of complications and morbidity.  Serious etiology was considered. Ddx includes but is not limited to: Differential diagnosis includes but is not exclusive to acute cholecystitis, intrathoracic causes for epigastric abdominal pain, gastritis, duodenitis, pancreatitis, small bowel or large bowel obstruction, abdominal aortic aneurysm, hernia, gastritis, etc.   Complete initial physical exam performed, notably the patient was in no distress, abdomen nonperitoneal.    Reviewed and confirmed nursing documentation for past medical history, family history, social history.  Vital signs reviewed.      Clinical Course as of 11/04/23 1020  Tue Nov 04, 2023  1610 Tetrahydrocannabinol(!): POSITIVE [SG]    Clinical Course User Index [SG] Sloan Leiter, DO    Brief summary: 33 year old female history as above here with epigastric pain, nausea vomiting. Visit from [facility] from 3/9 was reviewed including labs and CT imaging. Will repeat lab work, will give fluids, droperidol,  Protonix   Labs reviewed, mild lipase elev, she has no fever or evidence of infection. Consider mild pancreatitis. Gastritis seems most likely, also likely contribution by her THC use. She is feeling better on recheck, tolerating PO.  Given stable labs and exam and resolution of symptoms, do not feel imaging is urgently needed today  Recommend thc cessation, ppi/carafate for home, f/u pcp and gi  The patient improved significantly and was discharged in stable condition. Detailed discussions were had with the patient/guardian regarding current findings, and need for close f/u with PCP or on call doctor. The patient/guardian has been instructed to return immediately if the symptoms worsen in any way for re-evaluation. Patient/guardian verbalized understanding and is in agreement with current care plan. All questions answered prior to discharge.               Additional history obtained: -Additional history obtained from friend -External records from outside source obtained and reviewed including: Chart review including previous notes, labs, imaging, consultation notes including  Recent visit from [facility], prior labs and imaging, prior ER visits   Lab Tests: -I ordered, reviewed, and interpreted labs.   The pertinent results include:   Labs Reviewed  CBC WITH DIFFERENTIAL/PLATELET - Abnormal; Notable for the following components:      Result Value   Platelets 429 (*)    All other components within normal limits  COMPREHENSIVE METABOLIC PANEL - Abnormal; Notable for the following components:   Potassium 3.2 (*)    Chloride 97 (*)    Glucose, Bld 111 (*)    Calcium 10.5 (*)    Total Protein 9.4 (*)    Albumin 5.5 (*)    All other components within normal limits  LIPASE, BLOOD - Abnormal; Notable for the following components:   Lipase 251 (*)    All other components within normal limits  URINALYSIS, ROUTINE W REFLEX MICROSCOPIC - Abnormal; Notable for the following  components:   APPearance HAZY (*)    pH 8.5 (*)    Hgb urine dipstick SMALL (*)    Ketones, ur 40 (*)    Protein, ur 100 (*)    Leukocytes,Ua SMALL (*)    Bacteria, UA RARE (*)    All other components within normal limits  RAPID URINE DRUG SCREEN, HOSP PERFORMED - Abnormal; Notable for the following  components:   Tetrahydrocannabinol POSITIVE (*)    All other components within normal limits  RESP PANEL BY RT-PCR (RSV, FLU A&B, COVID)  RVPGX2    Notable for lipase+ thc+  EKG   EKG Interpretation Date/Time:  Tuesday November 04 2023 07:59:27 EDT Ventricular Rate:  56 PR Interval:  151 QRS Duration:  85 QT Interval:  487 QTC Calculation: 470 R Axis:   61  Text Interpretation: Sinus rhythm similar to prior  4/24 no stemi Confirmed by Tanda Rockers ([id number]) on 11/04/2023 8:19:13 AM         Imaging Studies ordered: na   Medicines ordered and prescription drug management: Meds ordered this encounter  Medications   sodium chloride 0.9 % bolus 1,000 mL   DISCONTD: ondansetron (ZOFRAN) injection 4 mg   pantoprazole (PROTONIX) injection 40 mg   droperidol (INAPSINE) 2.5 MG/ML injection 2.5 mg   magnesium sulfate IVPB 2 g 50 mL   diphenhydrAMINE (BENADRYL) injection 25 mg   pantoprazole (PROTONIX) 20 MG tablet    Sig: Take 1 tablet (20 mg total) by mouth daily for 14 days.    Dispense:  14 tablet    Refill:  0   sucralfate (CARAFATE) 1 g tablet    Sig: Take 1 tablet (1 g total) by mouth with breakfast, with lunch, and with evening meal for 7 days.    Dispense:  21 tablet    Refill:  0   Capsaicin-Menthol-Methyl Sal (CAPSAICIN-METHYL SAL-MENTHOL) 0.025-1-12 % CREA    Sig: Apply 1 Application topically 2 (two) times daily as needed.    Dispense:  56.6 g    Refill:  0    -I have reviewed the patients home medicines and have made adjustments as needed   Consultations Obtained: na   Cardiac Monitoring: The patient was maintained on a cardiac monitor.  I personally viewed and  interpreted the cardiac monitored which showed an underlying rhythm of: sinus brady Continuous pulse oximetry interpreted by myself, 100% on RA.    Social Determinants of Health:  Diagnosis or treatment significantly limited by social determinants of health: thc use   Reevaluation: After the interventions noted above, I reevaluated the patient and found that they have improved  Co morbidities that complicate the patient evaluation  Past Medical History:  Diagnosis Date   Allergy    Anemia    Chlamydia    06/15/14 w/tx, 10/11/14 w/tx, TOC-neg x2   Eczema    GERD (gastroesophageal reflux disease)    Gonorrhea 06/15/14   w/tx, TOC- neg   History of anemia    Infection    chlamydia & gonorrhea   Trichomonas       Dispostion: Disposition decision including need for hospitalization was considered, and patient discharged from emergency department.    Final Clinical Impression(s) / ED Diagnoses Final diagnoses:  Nausea and vomiting, unspecified vomiting type  Cannabis use disorder        Sloan Leiter, DO 11/04/23 1020

## 2023-11-04 NOTE — ED Notes (Signed)
 PO challenge started with ginger ale and graham crackers. Patient reports that she is feeling much better.

## 2023-11-04 NOTE — ED Triage Notes (Signed)
 Pt reports upper ABD pain and spasms with n/v x 4 days . Recent tx for same.

## 2023-11-04 NOTE — Discharge Instructions (Addendum)
 It was a pleasure caring for you today in the emergency department.  Recommend you follow clear liquid diet for the next 3-5 days, gradually increase your diet back to normal. Recommend follow bland diet for next 2 weeks. Follow back up with gastroenterology.   Please avoid ALL CANNABIS/MARIJUANA/THC    Please return to the emergency department for any worsening or worrisome symptoms.

## 2023-11-04 NOTE — ED Notes (Signed)
 Discharge instructions, follow up care, and prescriptions reviewed and explained, pt verbalized understanding.

## 2023-11-05 ENCOUNTER — Other Ambulatory Visit (HOSPITAL_BASED_OUTPATIENT_CLINIC_OR_DEPARTMENT_OTHER): Payer: Self-pay

## 2023-11-07 ENCOUNTER — Emergency Department (HOSPITAL_BASED_OUTPATIENT_CLINIC_OR_DEPARTMENT_OTHER)
Admission: EM | Admit: 2023-11-07 | Discharge: 2023-11-07 | Disposition: A | Discharge status: Home / Self Care | Payer: Self-pay

## 2023-11-07 ENCOUNTER — Telehealth: Payer: Self-pay | Admitting: Gastroenterology

## 2023-11-07 ENCOUNTER — Encounter (HOSPITAL_BASED_OUTPATIENT_CLINIC_OR_DEPARTMENT_OTHER): Payer: Self-pay | Admitting: Emergency Medicine

## 2023-11-07 DIAGNOSIS — R1013 Epigastric pain: Secondary | ICD-10-CM | POA: Insufficient documentation

## 2023-11-07 DIAGNOSIS — F1290 Cannabis use, unspecified, uncomplicated: Secondary | ICD-10-CM

## 2023-11-07 DIAGNOSIS — R112 Nausea with vomiting, unspecified: Secondary | ICD-10-CM | POA: Insufficient documentation

## 2023-11-07 DIAGNOSIS — F1210 Cannabis abuse, uncomplicated: Secondary | ICD-10-CM | POA: Insufficient documentation

## 2023-11-07 LAB — COMPREHENSIVE METABOLIC PANEL
ALT: 24 U/L (ref 0–44)
AST: 14 U/L — ABNORMAL LOW (ref 15–41)
Albumin: 5.1 g/dL — ABNORMAL HIGH (ref 3.5–5.0)
Alkaline Phosphatase: 56 U/L (ref 38–126)
Anion gap: 14 (ref 5–15)
BUN: 15 mg/dL (ref 6–20)
CO2: 29 mmol/L (ref 22–32)
Calcium: 9.7 mg/dL (ref 8.9–10.3)
Chloride: 95 mmol/L — ABNORMAL LOW (ref 98–111)
Creatinine, Ser: 0.72 mg/dL (ref 0.44–1.00)
GFR, Estimated: >60 mL/min (ref >60)
Glucose, Bld: 102 mg/dL — ABNORMAL HIGH (ref 70–99)
Potassium: 3.6 mmol/L (ref 3.5–5.1)
Sodium: 138 mmol/L (ref 135–145)
Total Bilirubin: 0.8 mg/dL (ref 0.0–1.2)
Total Protein: 8.2 g/dL — ABNORMAL HIGH (ref 6.5–8.1)

## 2023-11-07 LAB — CBC
HCT: 40.8 % (ref 36.0–46.0)
Hemoglobin: 13.8 g/dL (ref 12.0–15.0)
MCH: 29.9 pg (ref 26.0–34.0)
MCHC: 33.8 g/dL (ref 30.0–36.0)
MCV: 88.5 fL (ref 80.0–100.0)
Platelets: 398 10*3/uL (ref 150–400)
RBC: 4.61 MIL/uL (ref 3.87–5.11)
RDW: 13.3 % (ref 11.5–15.5)
WBC: 10.4 10*3/uL (ref 4.0–10.5)
nRBC: 0 % (ref 0.0–0.2)

## 2023-11-07 LAB — LIPASE, BLOOD: Lipase: 23 U/L (ref 11–51)

## 2023-11-07 MED ORDER — LORAZEPAM 2 MG/ML IJ SOLN: 1.0000 mg | Freq: Once | INTRAMUSCULAR | Status: DC

## 2023-11-07 MED ORDER — LORAZEPAM 2 MG/ML IJ SOLN
1.0000 mg | Freq: Once | INTRAMUSCULAR | Status: AC
  Administered 2023-11-07: 1 mg via INTRAVENOUS
  Filled 2023-11-07: qty 1

## 2023-11-07 MED ORDER — ONDANSETRON HCL 4 MG/2ML IJ SOLN
4.0000 mg | Freq: Once | INTRAMUSCULAR | Status: AC
  Administered 2023-11-07: 4 mg via INTRAVENOUS
  Filled 2023-11-07: qty 2

## 2023-11-07 MED ORDER — TRIMETHOBENZAMIDE HCL 100 MG/ML IM SOLN: 200.0000 mg | Freq: Once | INTRAMUSCULAR | Status: DC

## 2023-11-07 NOTE — ED Triage Notes (Signed)
 Pt arrived from home with c/o epigastric abdominal pain, N/V. Seen here Wed and sent home with medications. Unable to keep anything down since Saturday when pain started. Pain only relieved with heat

## 2023-11-07 NOTE — ED Provider Notes (Signed)
  EMERGENCY DEPARTMENT AT [facility] Provider Note   CSN: [id number] Arrival date & time: 11/07/23  1701     History  Chief Complaint  Patient presents with   Abdominal Pain   Emesis   Nausea    [name] [name] is a 33 y.o. female past medical history significant for pancreatitis, nausea and vomiting, and cannabis use disorder presents today for epigastric pain.  Patient also endorses nausea and vomiting.  Patient was seen for same on Wednesday and sent home with medications.  Patient denies any changes in her symptoms.  Patient denies weakness, chest pain, shortness of breath, fever, or chills.  Of note patient does smell of cannabis.   Abdominal Pain Associated symptoms: nausea and vomiting   Emesis Associated symptoms: abdominal pain        Home Medications Prior to Admission medications   Medication Sig Start Date End Date Taking? Authorizing Provider  Capsaicin-Menthol-Methyl Sal (CAPSAICIN-METHYL SAL-MENTHOL) 0.025-1-12 % CREA Apply 1 Application topically 2 (two) times daily as needed. 11/04/23   Tanda Rockers A, DO  ferrous sulfate 325 (65 FE) MG EC tablet Take 1 tablet (325 mg total) by mouth at bedtime. 12/31/19   Zehr, Princella Pellegrini, PA-C  fluconazole (DIFLUCAN) 150 MG tablet Take 1 tablet (150 mg total) by mouth every other day. 03/15/21   Particia Nearing, PA-C  hyoscyamine (LEVSIN SL) 0.125 MG SL tablet Place 1 tablet (0.125 mg total) under the tongue every 12 (twelve) hours as needed for cramping. 02/24/20   Napoleon Form, MD  lidocaine (XYLOCAINE) 2 % solution Use as directed 10 mLs in the mouth or throat as needed for mouth pain. 03/15/21   Particia Nearing, PA-C  ondansetron (ZOFRAN-ODT) 4 MG disintegrating tablet Take 1 tablet (4 mg total) by mouth every 8 (eight) hours as needed for nausea or vomiting. 11/26/22   Vanetta Mulders, MD  pantoprazole (PROTONIX) 20 MG tablet Take 1 tablet (20 mg total) by mouth daily for 14 days. 11/04/23  11/18/23  Sloan Leiter, DO  predniSONE (DELTASONE) 50 MG tablet 1 tablet daily with breakfast for 3 days 03/15/21   Particia Nearing, PA-C  promethazine [facility]) 25 MG tablet Take one tablet by mouth daily as needed 04/16/23   Napoleon Form, MD  sucralfate (CARAFATE) 1 g tablet Take 1 tablet (1 g total) by mouth with breakfast, with lunch, and with evening meal for 7 days. 11/04/23 11/11/23  Sloan Leiter, DO      Allergies    Patient has no known allergies.    Review of Systems   Review of Systems  Gastrointestinal:  Positive for abdominal pain, nausea and vomiting.    Physical Exam Updated Vital Signs BP 122/84   Pulse 87   Temp 98 F (36.7 C)   Resp 16   Ht 5\' 5"  (1.651 m)   Wt 81.6 kg   LMP 10/27/2023   SpO2 100%   BMI 29.95 kg/m  Physical Exam Vitals and nursing note reviewed.  Constitutional:      General: She is not in acute distress.    Appearance: She is well-developed. She is not ill-appearing, toxic-appearing or diaphoretic.  HENT:     Head: Normocephalic and atraumatic.  Eyes:     Extraocular Movements: Extraocular movements intact.     Conjunctiva/sclera: Conjunctivae normal.  Cardiovascular:     Rate and Rhythm: Normal rate and regular rhythm.     Heart sounds: Normal heart sounds. No murmur heard.  Pulmonary:     Effort: Pulmonary effort is normal. No respiratory distress.     Breath sounds: Normal breath sounds.  Abdominal:     General: Abdomen is flat. Bowel sounds are normal.     Palpations: Abdomen is soft.     Tenderness: There is abdominal tenderness in the epigastric area. There is no guarding. Negative signs include Murphy's sign, Rovsing's sign and McBurney's sign.  Musculoskeletal:        General: No swelling.     Cervical back: Neck supple.  Skin:    General: Skin is warm and dry.     Capillary Refill: Capillary refill takes less than 2 seconds.  Neurological:     General: No focal deficit present.     Mental Status: She is  alert.  Psychiatric:        Mood and Affect: Mood normal.     ED Results / Procedures / Treatments   Labs (all labs ordered are listed, but only abnormal results are displayed) Labs Reviewed  COMPREHENSIVE METABOLIC PANEL - Abnormal; Notable for the following components:      Result Value   Chloride 95 (*)    Glucose, Bld 102 (*)    Total Protein 8.2 (*)    Albumin 5.1 (*)    AST 14 (*)    All other components within normal limits  LIPASE, BLOOD  CBC  URINALYSIS, ROUTINE W REFLEX MICROSCOPIC  PREGNANCY, URINE    EKG EKG Interpretation Date/Time:  Friday November 07 2023 17:57:44 EDT Ventricular Rate:  110 PR Interval:  138 QRS Duration:  72 QT Interval:  386 QTC Calculation: 522 R Axis:   69  Text Interpretation: Sinus tachycardia Prolonged QT Abnormal ECG When compared with ECG of 04-Nov-2023 07:59, PREVIOUS ECG IS PRESENT Confirmed by Beckey Downing [phone]) on 11/07/2023 6:27:11 PM  Radiology No results found.  Procedures Procedures    Medications Ordered in ED Medications  ondansetron (ZOFRAN) injection 4 mg (4 mg Intravenous Given 11/07/23 1810)  LORazepam (ATIVAN) injection 1 mg (1 mg Intravenous Given 11/07/23 2118)    ED Course/ Medical Decision Making/ A&P                                 Medical Decision Making Amount and/or Complexity of Data Reviewed Labs: ordered.  Risk Prescription drug management.   This patient presents to the ED with chief complaint(s) of nausea and vomiting with pertinent past medical history of  pancreatitis, nausea and vomiting, cannabis use which further complicates the presenting complaint. The complaint involves an extensive differential diagnosis and also carries with it a high risk of complications and morbidity.    The differential diagnosis includes cannabis induced cyclic vomiting, pancreatitis, electrolyte abnormality  Additional history obtained: Records reviewed previous admission documents  ED Course and  Reassessment: Patient given Zofran and Ativan Patient able to tolerate p.o. intake without issue.  Independent labs interpretation:  The following labs were independently interpreted:  CBC: No notable findings CMP: Mildly decreased chloride, mildly elevated albumin, mildly elevated total protein Lipase: 23 EKG: Sinus tachycardia with prolonged QT  Consultation: - Consulted or discussed management/test interpretation w/ external professional: None  Consideration for admission or further workup: Considered for admission or further workup however patient's vital signs, physical exam, and labs are reassuring.  Patient symptoms likely due to her cannabis use.  Patient educated to stop smoking cannabis.  Patient should continue taking medications prescribed  to her from the ED on 3/11 and follow-up with her PCP for further evaluation and treatment.        Final Clinical Impression(s) / ED Diagnoses Final diagnoses:  Cannabis use disorder  Nausea and vomiting, unspecified vomiting type    Rx / DC Orders ED Discharge Orders     None         Gretta Began 11/07/23 2202    Durwin Glaze, MD 11/07/23 [phone]

## 2023-11-07 NOTE — Discharge Instructions (Addendum)
 Today you were seen for nausea and vomiting.  You should stop smoking cannabis as this may be contributing to your frequent nausea and vomiting.  Please continue using your capsaicin cream, sucralfate and pantoprazole as prescribed to you on 3/11.  Please follow-up with your primary care soon as possible for further evaluation and treatment.  Thank you for letting us treat you today. After reviewing your labs and performing a physical exam, I feel you are safe to go home. Please follow up with your PCP in the next several days and provide them with your records from this visit. Return to the Emergency Room if pain becomes severe or symptoms worsen.

## 2023-11-07 NOTE — Telephone Encounter (Signed)
 Pt states she has been having issues with her pancreas since Saturday. Reports she was seen in 2 diff er's and got some meds but nothing is helping. She also states she is not able to keep any fluids or food down or meds. Discussed with pt that she will have to be seen in the er if she cannot keep fluids or meds down. She verbalized understanding.

## 2023-11-07 NOTE — Telephone Encounter (Signed)
 Patient called and stated that she would like to speak to a nurse. Patient did not want to give anymore information on to why she wanted to speak to the nurse. Patient is requesting a call back. Please advise.

## 2023-11-12 ENCOUNTER — Ambulatory Visit: Payer: Self-pay | Admitting: Gastroenterology

## 2023-11-14 ENCOUNTER — Other Ambulatory Visit (HOSPITAL_BASED_OUTPATIENT_CLINIC_OR_DEPARTMENT_OTHER): Payer: Self-pay

## 2024-03-16 ENCOUNTER — Other Ambulatory Visit: Payer: Self-pay

## 2024-03-16 ENCOUNTER — Emergency Department (HOSPITAL_BASED_OUTPATIENT_CLINIC_OR_DEPARTMENT_OTHER)
Admission: EM | Admit: 2024-03-16 | Discharge: 2024-03-16 | Disposition: A | Discharge status: Home / Self Care | Payer: Self-pay | Attending: Emergency Medicine | Admitting: Emergency Medicine

## 2024-03-16 DIAGNOSIS — R1115 Cyclical vomiting syndrome unrelated to migraine: Secondary | ICD-10-CM | POA: Insufficient documentation

## 2024-03-16 DIAGNOSIS — R1084 Generalized abdominal pain: Secondary | ICD-10-CM | POA: Insufficient documentation

## 2024-03-16 DIAGNOSIS — R739 Hyperglycemia, unspecified: Secondary | ICD-10-CM | POA: Insufficient documentation

## 2024-03-16 LAB — BASIC METABOLIC PANEL WITH GFR
Anion gap: 15 (ref 5–15)
BUN: 9 mg/dL (ref 6–20)
CO2: 22 mmol/L (ref 22–32)
Calcium: 9.2 mg/dL (ref 8.9–10.3)
Chloride: 101 mmol/L (ref 98–111)
Creatinine, Ser: 0.62 mg/dL (ref 0.44–1.00)
GFR, Estimated: >60 mL/min (ref >60)
Glucose, Bld: 144 mg/dL — ABNORMAL HIGH (ref 70–99)
Potassium: 4.1 mmol/L (ref 3.5–5.1)
Sodium: 138 mmol/L (ref 135–145)

## 2024-03-16 LAB — COMPREHENSIVE METABOLIC PANEL WITH GFR
ALT: 53 U/L — ABNORMAL HIGH (ref 0–44)
AST: 41 U/L (ref 15–41)
Albumin: 4.8 g/dL (ref 3.5–5.0)
Alkaline Phosphatase: 94 U/L (ref 38–126)
Anion gap: 19 — ABNORMAL HIGH (ref 5–15)
BUN: 11 mg/dL (ref 6–20)
CO2: 21 mmol/L — ABNORMAL LOW (ref 22–32)
Calcium: 10.2 mg/dL (ref 8.9–10.3)
Chloride: 98 mmol/L (ref 98–111)
Creatinine, Ser: 0.75 mg/dL (ref 0.44–1.00)
GFR, Estimated: >60 mL/min (ref >60)
Glucose, Bld: 201 mg/dL — ABNORMAL HIGH (ref 70–99)
Potassium: 4.3 mmol/L (ref 3.5–5.1)
Sodium: 138 mmol/L (ref 135–145)
Total Bilirubin: 0.5 mg/dL (ref 0.0–1.2)
Total Protein: 8.9 g/dL — ABNORMAL HIGH (ref 6.5–8.1)

## 2024-03-16 LAB — PREGNANCY, URINE: Preg Test, Ur: NEGATIVE

## 2024-03-16 LAB — URINALYSIS, ROUTINE W REFLEX MICROSCOPIC
Bacteria, UA: NONE SEEN
Bilirubin Urine: NEGATIVE
Glucose, UA: 500 mg/dL — AB
Hgb urine dipstick: NEGATIVE
Ketones, ur: >80 mg/dL — AB
Leukocytes,Ua: NEGATIVE
Nitrite: NEGATIVE
Protein, ur: 30 mg/dL — AB
Specific Gravity, Urine: 1.036 — ABNORMAL HIGH (ref 1.005–1.030)
pH: 6 (ref 5.0–8.0)

## 2024-03-16 LAB — CBC
HCT: 40.2 % (ref 36.0–46.0)
Hemoglobin: 13.1 g/dL (ref 12.0–15.0)
MCH: 29.9 pg (ref 26.0–34.0)
MCHC: 32.6 g/dL (ref 30.0–36.0)
MCV: 91.8 fL (ref 80.0–100.0)
Platelets: 288 K/uL (ref 150–400)
RBC: 4.38 MIL/uL (ref 3.87–5.11)
RDW: 14 % (ref 11.5–15.5)
WBC: 15.9 K/uL — ABNORMAL HIGH (ref 4.0–10.5)
nRBC: 0 % (ref 0.0–0.2)

## 2024-03-16 LAB — LIPASE, BLOOD: Lipase: 28 U/L (ref 11–51)

## 2024-03-16 MED ORDER — SODIUM CHLORIDE 0.9 % IV BOLUS
1000.0000 mL | Freq: Once | INTRAVENOUS | Status: AC
  Administered 2024-03-16: 1000 mL via INTRAVENOUS

## 2024-03-16 MED ORDER — LACTATED RINGERS IV BOLUS
1000.0000 mL | Freq: Once | INTRAVENOUS | Status: AC
  Administered 2024-03-16: 1000 mL via INTRAVENOUS

## 2024-03-16 MED ORDER — ONDANSETRON HCL 4 MG/2ML IJ SOLN
4.0000 mg | Freq: Once | INTRAMUSCULAR | Status: AC
  Administered 2024-03-16: 4 mg via INTRAVENOUS
  Filled 2024-03-16: qty 2

## 2024-03-16 MED ORDER — ONDANSETRON 4 MG PO TBDP
4.0000 mg | ORAL_TABLET | Freq: Three times a day (TID) | ORAL | 1 refills | Status: AC | PRN
Start: 2024-03-16 — End: ?

## 2024-03-16 MED ORDER — HALOPERIDOL LACTATE 5 MG/ML IJ SOLN
5.0000 mg | Freq: Once | INTRAMUSCULAR | Status: AC
  Administered 2024-03-16: 5 mg via INTRAVENOUS
  Filled 2024-03-16: qty 1

## 2024-03-16 NOTE — ED Triage Notes (Signed)
 Pt POV reporting persistent n/v today, hx gastroparesis, unable to keep anything down.

## 2024-03-16 NOTE — ED Provider Notes (Signed)
 [place] EMERGENCY DEPARTMENT AT [facility]  Provider Note  CSN: [id number] Arrival date & time: 03/16/24 0059  History Chief Complaint  Patient presents with   Abdominal Pain         [name] [name] is a 33 y.o. female with history of recurrent abdominal pain/cyclic vomiting reports she has been told by GI she might need surgery but she is unsure what surgery it might be. She has also been told in the past her marijuana use may be contributing but she denies recent THC use (strong smell of marijuana in room is attributed to her husband at bedside). Here today with one day of vomiting. No hematemesis. Diffuse abdominal pain.    Home Medications Prior to Admission medications   Medication Sig Start Date End Date Taking? Authorizing Provider  Capsaicin -Menthol -Methyl Sal (CAPSAICIN -METHYL SAL-MENTHOL ) 0.025-1-12 % CREA Apply 1 Application topically 2 (two) times daily as needed. 11/04/23   Elnor Jayson LABOR, DO  ferrous sulfate  325 (65 FE) MG EC tablet Take 1 tablet (325 mg total) by mouth at bedtime. 12/31/19   Zehr, Jessica D, PA-C  fluconazole  (DIFLUCAN ) 150 MG tablet Take 1 tablet (150 mg total) by mouth every other day. 03/15/21   Stuart Vernell Norris, PA-C  hyoscyamine  (LEVSIN  SL) 0.125 MG SL tablet Place 1 tablet (0.125 mg total) under the tongue every 12 (twelve) hours as needed for cramping. 02/24/20   Nandigam, Kavitha V, MD  lidocaine  (XYLOCAINE ) 2 % solution Use as directed 10 mLs in the mouth or throat as needed for mouth pain. 03/15/21   Stuart Vernell Norris, PA-C  ondansetron  (ZOFRAN -ODT) 4 MG disintegrating tablet Take 1 tablet (4 mg total) by mouth every 8 (eight) hours as needed for nausea or vomiting. 03/16/24   Roselyn Carlin NOVAK, MD  pantoprazole  (PROTONIX ) 20 MG tablet Take 1 tablet (20 mg total) by mouth daily for 14 days. 11/04/23 11/18/23  Elnor Jayson LABOR, DO  predniSONE  (DELTASONE ) 50 MG tablet 1 tablet daily with breakfast for 3 days 03/15/21   Stuart Vernell Norris, PA-C  promethazine  (PHENERGAN ) 25 MG tablet Take one tablet by mouth daily as needed 04/16/23   Nandigam, Kavitha V, MD  sucralfate  (CARAFATE ) 1 g tablet Take 1 tablet (1 g total) by mouth with breakfast, with lunch, and with evening meal for 7 days. 11/04/23 11/11/23  Elnor Jayson LABOR, DO     Allergies    Patient has no known allergies.   Review of Systems   Review of Systems Please see HPI for pertinent positives and negatives  Physical Exam BP 110/71   Pulse (!) 56   Temp 98.2 F (36.8 C) (Axillary)   Resp (!) 24   Ht 5' 5 (1.651 m)   Wt 77.1 kg   SpO2 100%   BMI 28.29 kg/m   Physical Exam Vitals and nursing note reviewed.  Constitutional:      Appearance: Normal appearance.  HENT:     Head: Normocephalic and atraumatic.     Nose: Nose normal.     Mouth/Throat:     Mouth: Mucous membranes are moist.  Eyes:     Extraocular Movements: Extraocular movements intact.     Conjunctiva/sclera: Conjunctivae normal.  Cardiovascular:     Rate and Rhythm: Normal rate.  Pulmonary:     Effort: Pulmonary effort is normal.     Breath sounds: Normal breath sounds.  Abdominal:     General: Abdomen is flat.     Palpations: Abdomen is soft.  Tenderness: There is generalized abdominal tenderness. There is no guarding. Negative signs include Murphy's sign and McBurney's sign.  Musculoskeletal:        General: No swelling. Normal range of motion.     Cervical back: Neck supple.  Skin:    General: Skin is warm and dry.  Neurological:     General: No focal deficit present.     Mental Status: She is alert.  Psychiatric:        Mood and Affect: Mood normal.     ED Results / Procedures / Treatments   EKG EKG Interpretation Date/Time:  Tuesday March 16 2024 01:21:47 EDT Ventricular Rate:  58 PR Interval:  166 QRS Duration:  82 QT Interval:  484 QTC Calculation: 476 R Axis:   63  Text Interpretation: Sinus rhythm Borderline prolonged QT interval Since last tracing  QT has shortened Confirmed by Roselyn Dunnings [phone]) on 03/16/2024 1:24:50 AM  Procedures Procedures  Medications Ordered in the ED Medications  ondansetron  (ZOFRAN ) injection 4 mg (4 mg Intravenous Given 03/16/24 0125)  sodium chloride  0.9 % bolus 1,000 mL (0 mLs Intravenous Stopped 03/16/24 0221)  haloperidol  lactate (HALDOL ) injection 5 mg (5 mg Intravenous Given 03/16/24 0152)  lactated ringers  bolus 1,000 mL (1,000 mLs Intravenous New Bag/Given 03/16/24 0337)    Initial Impression and Plan  Patient here with N/V and abdominal pain similar to previous. She had one prior episode of pancreatitis in Nov 2024. Abdomen is benign. Will check labs, give antiemetics and IVF.   ED Course   Clinical Course as of 03/16/24 0508  Tue Mar 16, 2024  0132 CBC with leukocytosis, otherwise normal.  [CS]  0153 CMP with mildly elevated anion gap and glucose, normal LFTs and lipase.  [CS]  0303 Pain improved but not quite resolved. Vomiting is resolved, no further nausea.  [CS]  0331 UA with ketones consistent with poor intake, less likely early/mild DKA. HCG is neg. Will give a second bag of IVF and recheck BMP.  [CS]  0502 Repeat BMP is improved. Glucose remains mildly elevated, but gap has closed. Will plan discharge with Rx for Zofran , encouraged to avoid any THC use. Follow up with PCP to recheck glucose and with GI for her GI symptoms. RTED for any other concerns.  [CS]    Clinical Course User Index [CS] Roselyn Dunnings NOVAK, MD     MDM Rules/Calculators/A&P Medical Decision Making Given presenting complaint, I considered that admission might be necessary. After review of results from ED lab and/or imaging studies, admission to the hospital is not indicated at this time.    Problems Addressed: Cyclic vomiting syndrome: chronic illness or injury with exacerbation, progression, or side effects of treatment Hyperglycemia: undiagnosed new problem with uncertain prognosis  Amount and/or Complexity  of Data Reviewed Labs: ordered. Decision-making details documented in ED Course. ECG/medicine tests: ordered and independent interpretation performed. Decision-making details documented in ED Course.  Risk Prescription drug management. Decision regarding hospitalization.     Final Clinical Impression(s) / ED Diagnoses Final diagnoses:  Cyclic vomiting syndrome  Hyperglycemia    Rx / DC Orders ED Discharge Orders          Ordered    ondansetron  (ZOFRAN -ODT) 4 MG disintegrating tablet  Every 8 hours PRN        03/16/24 0508             Roselyn Dunnings NOVAK, MD 03/16/24 [phone]

## 2024-03-17 ENCOUNTER — Emergency Department (HOSPITAL_BASED_OUTPATIENT_CLINIC_OR_DEPARTMENT_OTHER)
Admission: EM | Admit: 2024-03-17 | Discharge: 2024-03-17 | Discharge status: Against Medical Advice | Payer: Self-pay | Attending: Emergency Medicine | Admitting: Emergency Medicine

## 2024-03-17 ENCOUNTER — Encounter (HOSPITAL_BASED_OUTPATIENT_CLINIC_OR_DEPARTMENT_OTHER): Payer: Self-pay

## 2024-03-17 ENCOUNTER — Other Ambulatory Visit: Payer: Self-pay

## 2024-03-17 DIAGNOSIS — R1110 Vomiting, unspecified: Secondary | ICD-10-CM | POA: Insufficient documentation

## 2024-03-17 DIAGNOSIS — Z5321 Procedure and treatment not carried out due to patient leaving prior to being seen by health care provider: Secondary | ICD-10-CM | POA: Insufficient documentation

## 2024-03-17 DIAGNOSIS — R1084 Generalized abdominal pain: Secondary | ICD-10-CM | POA: Insufficient documentation

## 2024-03-17 NOTE — ED Notes (Signed)
Unable to obtain blood work in triage.

## 2024-03-17 NOTE — ED Triage Notes (Signed)
 Pt reports generalized abdominal pain for the last three days with emesis. Pt reports being seen yesterday and has prescription for zofran  which has not helped.

## 2024-03-17 NOTE — ED Notes (Signed)
Pt called for room assignment no answer 

## 2024-03-18 ENCOUNTER — Emergency Department (HOSPITAL_BASED_OUTPATIENT_CLINIC_OR_DEPARTMENT_OTHER)
Admission: EM | Admit: 2024-03-18 | Discharge: 2024-03-18 | Disposition: A | Discharge status: Home / Self Care | Payer: Self-pay | Attending: Emergency Medicine | Admitting: Emergency Medicine

## 2024-03-18 ENCOUNTER — Encounter (HOSPITAL_BASED_OUTPATIENT_CLINIC_OR_DEPARTMENT_OTHER): Payer: Self-pay | Admitting: Emergency Medicine

## 2024-03-18 DIAGNOSIS — K859 Acute pancreatitis without necrosis or infection, unspecified: Secondary | ICD-10-CM

## 2024-03-18 DIAGNOSIS — R112 Nausea with vomiting, unspecified: Secondary | ICD-10-CM | POA: Insufficient documentation

## 2024-03-18 DIAGNOSIS — R109 Unspecified abdominal pain: Secondary | ICD-10-CM | POA: Insufficient documentation

## 2024-03-18 LAB — CBC WITH DIFFERENTIAL/PLATELET
Abs Immature Granulocytes: 0.03 K/uL (ref 0.00–0.07)
Basophils Absolute: 0 K/uL (ref 0.0–0.1)
Basophils Relative: 0 %
Eosinophils Absolute: 0 K/uL (ref 0.0–0.5)
Eosinophils Relative: 0 %
HCT: 40.2 % (ref 36.0–46.0)
Hemoglobin: 13.4 g/dL (ref 12.0–15.0)
Immature Granulocytes: 0 %
Lymphocytes Relative: 16 %
Lymphs Abs: 1.5 K/uL (ref 0.7–4.0)
MCH: 29.8 pg (ref 26.0–34.0)
MCHC: 33.3 g/dL (ref 30.0–36.0)
MCV: 89.5 fL (ref 80.0–100.0)
Monocytes Absolute: 0.7 K/uL (ref 0.1–1.0)
Monocytes Relative: 8 %
Neutro Abs: 7.1 K/uL (ref 1.7–7.7)
Neutrophils Relative %: 76 %
Platelets: 331 K/uL (ref 150–400)
RBC: 4.49 MIL/uL (ref 3.87–5.11)
RDW: 14 % (ref 11.5–15.5)
WBC: 9.3 K/uL (ref 4.0–10.5)
nRBC: 0 % (ref 0.0–0.2)

## 2024-03-18 LAB — COMPREHENSIVE METABOLIC PANEL WITH GFR
ALT: 31 U/L (ref 0–44)
AST: 22 U/L (ref 15–41)
Albumin: 5 g/dL (ref 3.5–5.0)
Alkaline Phosphatase: 85 U/L (ref 38–126)
Anion gap: 16 — ABNORMAL HIGH (ref 5–15)
BUN: 14 mg/dL (ref 6–20)
CO2: 26 mmol/L (ref 22–32)
Calcium: 10.5 mg/dL — ABNORMAL HIGH (ref 8.9–10.3)
Chloride: 99 mmol/L (ref 98–111)
Creatinine, Ser: 0.69 mg/dL (ref 0.44–1.00)
GFR, Estimated: >60 mL/min (ref >60)
Glucose, Bld: 108 mg/dL — ABNORMAL HIGH (ref 70–99)
Potassium: 3.6 mmol/L (ref 3.5–5.1)
Sodium: 141 mmol/L (ref 135–145)
Total Bilirubin: 0.6 mg/dL (ref 0.0–1.2)
Total Protein: 9.3 g/dL — ABNORMAL HIGH (ref 6.5–8.1)

## 2024-03-18 LAB — LIPASE, BLOOD: Lipase: 129 U/L — ABNORMAL HIGH (ref 11–51)

## 2024-03-18 LAB — URINALYSIS, ROUTINE W REFLEX MICROSCOPIC
Bacteria, UA: NONE SEEN
Glucose, UA: NEGATIVE mg/dL
Ketones, ur: >80 mg/dL — AB
Nitrite: NEGATIVE
Protein, ur: 100 mg/dL — AB
Specific Gravity, Urine: 1.041 — ABNORMAL HIGH (ref 1.005–1.030)
pH: 7 (ref 5.0–8.0)

## 2024-03-18 MED ORDER — PROMETHAZINE HCL 25 MG PO TABS
25.0000 mg | ORAL_TABLET | Freq: Once | ORAL | Status: AC
  Administered 2024-03-18: 25 mg via ORAL
  Filled 2024-03-18: qty 1

## 2024-03-18 MED ORDER — METOCLOPRAMIDE HCL 10 MG PO TABS
10.0000 mg | ORAL_TABLET | Freq: Once | ORAL | Status: AC
Start: 2024-03-18 — End: 2024-03-18
  Administered 2024-03-18: 10 mg via ORAL
  Filled 2024-03-18: qty 1

## 2024-03-18 MED ORDER — PROMETHAZINE HCL 25 MG PO TABS: 25.0000 mg | ORAL_TABLET | Freq: Four times a day (QID) | ORAL | 0 refills | Status: DC | PRN

## 2024-03-18 MED ORDER — SODIUM CHLORIDE 0.9 % IV BOLUS
1000.0000 mL | Freq: Once | INTRAVENOUS | Status: AC
  Administered 2024-03-18: 1000 mL via INTRAVENOUS

## 2024-03-18 NOTE — ED Provider Notes (Signed)
 [place] EMERGENCY DEPARTMENT AT [facility] Provider Note   CSN: [id number] Arrival date & time: 03/18/24  1103     Patient presents with: Emesis   [name] [name] is a 33 y.o. female.   Pt complains of nausea, vomiting and abdominal pain.  Pt reports she has had multiple episodes of the same.  Pt reports she has been seen by Gi.  Pt reports phenergan  works best for her symptoms.  Pt denies exposure to anyone who has reently been sick.    The history is provided by the patient. No language interpreter was used.  Emesis Severity:  Moderate Timing:  Constant Progression:  Worsening Chronicity:  New Relieved by:  Nothing Worsened by:  Nothing Associated symptoms: abdominal pain        Prior to Admission medications   Medication Sig Start Date End Date Taking? Authorizing Provider  Capsaicin -Menthol -Methyl Sal (CAPSAICIN -METHYL SAL-MENTHOL ) 0.025-1-12 % CREA Apply 1 Application topically 2 (two) times daily as needed. 11/04/23   Elnor Jayson LABOR, DO  ferrous sulfate  325 (65 FE) MG EC tablet Take 1 tablet (325 mg total) by mouth at bedtime. 12/31/19   Zehr, Jessica D, PA-C  fluconazole  (DIFLUCAN ) 150 MG tablet Take 1 tablet (150 mg total) by mouth every other day. 03/15/21   Stuart Vernell Norris, PA-C  hyoscyamine  (LEVSIN  SL) 0.125 MG SL tablet Place 1 tablet (0.125 mg total) under the tongue every 12 (twelve) hours as needed for cramping. 02/24/20   Nandigam, Kavitha V, MD  lidocaine  (XYLOCAINE ) 2 % solution Use as directed 10 mLs in the mouth or throat as needed for mouth pain. 03/15/21   Stuart Vernell Norris, PA-C  ondansetron  (ZOFRAN -ODT) 4 MG disintegrating tablet Take 1 tablet (4 mg total) by mouth every 8 (eight) hours as needed for nausea or vomiting. 03/16/24   Roselyn Carlin NOVAK, MD  pantoprazole  (PROTONIX ) 20 MG tablet Take 1 tablet (20 mg total) by mouth daily for 14 days. 11/04/23 11/18/23  Elnor Jayson LABOR, DO  predniSONE  (DELTASONE ) 50 MG tablet 1 tablet daily  with breakfast for 3 days 03/15/21   Stuart Vernell Norris, PA-C  promethazine  (PHENERGAN ) 25 MG tablet Take one tablet by mouth daily as needed 04/16/23   Nandigam, Kavitha V, MD  sucralfate  (CARAFATE ) 1 g tablet Take 1 tablet (1 g total) by mouth with breakfast, with lunch, and with evening meal for 7 days. 11/04/23 11/11/23  Elnor Jayson LABOR, DO    Allergies: Patient has no known allergies.    Review of Systems  Gastrointestinal:  Positive for abdominal pain and vomiting.  All other systems reviewed and are negative.   Updated Vital Signs BP (!) 140/78   Pulse (!) 55   Temp 98.5 F (36.9 C) (Oral)   Resp 16   LMP 02/23/2024 (Approximate)   SpO2 98%   Physical Exam Vitals and nursing note reviewed.  Constitutional:      Appearance: She is well-developed.  HENT:     Head: Normocephalic.  Cardiovascular:     Rate and Rhythm: Normal rate.  Pulmonary:     Effort: Pulmonary effort is normal.  Abdominal:     General: There is no distension.  Musculoskeletal:        General: Normal range of motion.     Cervical back: Normal range of motion.  Skin:    General: Skin is warm.  Neurological:     General: No focal deficit present.     Mental Status: She is alert and oriented  to person, place, and time.     (all labs ordered are listed, but only abnormal results are displayed) Labs Reviewed  COMPREHENSIVE METABOLIC PANEL WITH GFR - Abnormal; Notable for the following components:      Result Value   Glucose, Bld 108 (*)    Calcium  10.5 (*)    Total Protein 9.3 (*)    Anion gap 16 (*)    All other components within normal limits  URINALYSIS, ROUTINE W REFLEX MICROSCOPIC - Abnormal; Notable for the following components:   APPearance HAZY (*)    Specific Gravity, Urine 1.041 (*)    Hgb urine dipstick TRACE (*)    Bilirubin Urine SMALL (*)    Ketones, ur >80 (*)    Protein, ur 100 (*)    Leukocytes,Ua SMALL (*)    All other components within normal limits  LIPASE, BLOOD -  Abnormal; Notable for the following components:   Lipase 129 (*)    All other components within normal limits  CBC WITH DIFFERENTIAL/PLATELET    EKG: None  Radiology: No results found.   Procedures   Medications Ordered in the ED  promethazine  (PHENERGAN ) tablet 25 mg (25 mg Oral Given 03/18/24 1213)  sodium chloride  0.9 % bolus 1,000 mL (0 mLs Intravenous Stopped 03/18/24 1316)                                    Medical Decision Making Pt complains of nausea and vomiting.  Pt reports she has a histroy of the same.   Amount and/or Complexity of Data Reviewed Independent Historian: spouse    Details: Pt here with husband who is supportive. External Data Reviewed: notes.    Details: ED and Gi notes reviewed  Labs: ordered. Decision-making details documented in ED Course.    Details: Pt has an elevated lipase of 129.   Radiology:     Details: Pt had a ct scan yesterday.    Risk Prescription drug management. Risk Details: Pt reports nausea has improved with phenergan .  Pt given Iv fluids x 1 liter.  Pt counseled on results.  Pt advised to follow up with Gi.  Pt advised to return if any problems.        Final diagnoses:  Nausea and vomiting, unspecified vomiting type    ED Discharge Orders          Ordered    promethazine  (PHENERGAN ) 25 MG tablet  Every 6 hours PRN        03/18/24 1458          An After Visit Summary was printed and given to the patient.      Flint Sonny POUR, PA-C 03/18/24 1501    Ruthe Cornet, DO 03/20/24 [phone]

## 2024-03-18 NOTE — ED Triage Notes (Signed)
 Pt caox4 c/o recurrent N/V and abd pain x5 days. LWBS yest, tx in ED 7/22 for same.

## 2024-03-18 NOTE — ED Notes (Signed)
 Pt d/c instructions, medications, and follow-up care reviewed with pt. Pt verbalized understanding and had no further questions at time of d/c. Pt CA&Ox4, ambulatory, and in NAD at time of d/c

## 2024-03-18 NOTE — Discharge Instructions (Addendum)
Return if any problems.  Follow up with your Physician for recheck.  Return if any problems.

## 2024-03-18 NOTE — ED Notes (Signed)
Spoke with lab about urine.

## 2024-05-11 ENCOUNTER — Emergency Department (HOSPITAL_BASED_OUTPATIENT_CLINIC_OR_DEPARTMENT_OTHER)
Admission: EM | Admit: 2024-05-11 | Discharge: 2024-05-11 | Disposition: A | Discharge status: Home / Self Care | Payer: Self-pay | Attending: Emergency Medicine | Admitting: Emergency Medicine

## 2024-05-11 ENCOUNTER — Encounter (HOSPITAL_BASED_OUTPATIENT_CLINIC_OR_DEPARTMENT_OTHER): Payer: Self-pay

## 2024-05-11 ENCOUNTER — Other Ambulatory Visit: Payer: Self-pay

## 2024-05-11 ENCOUNTER — Emergency Department (HOSPITAL_BASED_OUTPATIENT_CLINIC_OR_DEPARTMENT_OTHER): Payer: Self-pay

## 2024-05-11 DIAGNOSIS — R112 Nausea with vomiting, unspecified: Secondary | ICD-10-CM | POA: Insufficient documentation

## 2024-05-11 DIAGNOSIS — R1084 Generalized abdominal pain: Secondary | ICD-10-CM | POA: Insufficient documentation

## 2024-05-11 DIAGNOSIS — D72829 Elevated white blood cell count, unspecified: Secondary | ICD-10-CM | POA: Insufficient documentation

## 2024-05-11 LAB — URINALYSIS, ROUTINE W REFLEX MICROSCOPIC
Bacteria, UA: NONE SEEN
Bilirubin Urine: NEGATIVE
Glucose, UA: NEGATIVE mg/dL
Ketones, ur: >80 mg/dL — AB
Nitrite: NEGATIVE
Protein, ur: 100 mg/dL — AB
Specific Gravity, Urine: 1.03 (ref 1.005–1.030)
pH: 7.5 (ref 5.0–8.0)

## 2024-05-11 LAB — COMPREHENSIVE METABOLIC PANEL WITH GFR
ALT: 18 U/L (ref 0–44)
AST: 15 U/L (ref 15–41)
Albumin: 5.1 g/dL — ABNORMAL HIGH (ref 3.5–5.0)
Alkaline Phosphatase: 82 U/L (ref 38–126)
Anion gap: 17 — ABNORMAL HIGH (ref 5–15)
BUN: 12 mg/dL (ref 6–20)
CO2: 26 mmol/L (ref 22–32)
Calcium: 11 mg/dL — ABNORMAL HIGH (ref 8.9–10.3)
Chloride: 99 mmol/L (ref 98–111)
Creatinine, Ser: 0.67 mg/dL (ref 0.44–1.00)
GFR, Estimated: >60 mL/min (ref >60)
Glucose, Bld: 129 mg/dL — ABNORMAL HIGH (ref 70–99)
Potassium: 3.8 mmol/L (ref 3.5–5.1)
Sodium: 142 mmol/L (ref 135–145)
Total Bilirubin: 0.6 mg/dL (ref 0.0–1.2)
Total Protein: 8.9 g/dL — ABNORMAL HIGH (ref 6.5–8.1)

## 2024-05-11 LAB — CBC
HCT: 37.9 % (ref 36.0–46.0)
Hemoglobin: 12.8 g/dL (ref 12.0–15.0)
MCH: 29.5 pg (ref 26.0–34.0)
MCHC: 33.8 g/dL (ref 30.0–36.0)
MCV: 87.3 fL (ref 80.0–100.0)
Platelets: 367 K/uL (ref 150–400)
RBC: 4.34 MIL/uL (ref 3.87–5.11)
RDW: 13.4 % (ref 11.5–15.5)
WBC: 10.6 K/uL — ABNORMAL HIGH (ref 4.0–10.5)
nRBC: 0 % (ref 0.0–0.2)

## 2024-05-11 LAB — PREGNANCY, URINE: Preg Test, Ur: NEGATIVE

## 2024-05-11 LAB — LIPASE, BLOOD: Lipase: 69 U/L — ABNORMAL HIGH (ref 11–51)

## 2024-05-11 MED ORDER — ONDANSETRON HCL 4 MG/2ML IJ SOLN: 4.0000 mg | Freq: Once | INTRAMUSCULAR | Status: DC

## 2024-05-11 MED ORDER — DROPERIDOL 2.5 MG/ML IJ SOLN
1.2500 mg | Freq: Once | INTRAMUSCULAR | Status: DC
Start: 2024-05-11 — End: 2024-05-11

## 2024-05-11 MED ORDER — PROMETHAZINE HCL 25 MG/ML IJ SOLN
INTRAMUSCULAR | Status: AC
  Filled 2024-05-11: qty 1

## 2024-05-11 MED ORDER — PROMETHAZINE HCL 25 MG PO TABS: 25.0000 mg | ORAL_TABLET | Freq: Four times a day (QID) | ORAL | 0 refills | Status: AC | PRN

## 2024-05-11 MED ORDER — IOHEXOL 300 MG/ML  SOLN
100.0000 mL | Freq: Once | INTRAMUSCULAR | Status: AC | PRN
  Administered 2024-05-11: 100 mL via INTRAVENOUS

## 2024-05-11 MED ORDER — SODIUM CHLORIDE 0.9 % IV BOLUS
1000.0000 mL | Freq: Once | INTRAVENOUS | Status: AC
  Administered 2024-05-11: 1000 mL via INTRAVENOUS

## 2024-05-11 MED ORDER — PANTOPRAZOLE SODIUM 40 MG IV SOLR
40.0000 mg | Freq: Once | INTRAVENOUS | Status: AC
  Administered 2024-05-11: 40 mg via INTRAVENOUS
  Filled 2024-05-11: qty 10

## 2024-05-11 MED ORDER — SODIUM CHLORIDE 0.9 % IV SOLN
12.5000 mg | INTRAVENOUS | Status: AC
  Administered 2024-05-11: 12.5 mg via INTRAVENOUS
  Filled 2024-05-11: qty 0.5

## 2024-05-11 NOTE — ED Notes (Signed)
 Pt alert and oriented X 4 at the time of discharge. RR even and unlabored. No acute distress noted. Pt verbalized understanding of discharge instructions as discussed. Pt ambulatory to lobby at time of discharge.

## 2024-05-11 NOTE — Discharge Instructions (Signed)
 It was a pleasure taking part in your care.  As we discussed, your work appears reassuring.  Please begin taking Phenergan  every 6 hours as needed for nausea.  Please follow-up with your PCP.  Return to ED with new symptoms.

## 2024-05-11 NOTE — ED Triage Notes (Signed)
 C/o central abd pain x 2 days. +n/v. Hx of pancrease flare ups

## 2024-05-11 NOTE — ED Provider Notes (Signed)
 [place] EMERGENCY DEPARTMENT AT [facility] Provider Note   CSN: [id number] Arrival date & time: 05/11/24  1637     Patient presents with: Abdominal Pain   [name] [name] is a 33 y.o. female with history of cyclic vomiting syndrome, chronic gastritis, GERD, iron deficiency anemia.  Patient presents to ED for evaluation of nausea vomiting and epigastric abdominal pain.  Reports symptoms began Monday.  States she has had nausea, vomiting every time she eats.  Denies diarrhea, fevers, blood in stool, blood in vomit.  Denies chest pain or shortness of breath.  Denies dysuria, flank pain or vaginal discharge.  Denies cannabis use however patient does have history of CHS in chart.  Denies alcohol use.  Denies known sick contacts.   Abdominal Pain Associated symptoms: nausea and vomiting        Prior to Admission medications   Medication Sig Start Date End Date Taking? Authorizing Provider  promethazine  (PHENERGAN ) 25 MG tablet Take 1 tablet (25 mg total) by mouth every 6 (six) hours as needed for nausea or vomiting. 05/11/24  Yes Ruthell Lonni FALCON, PA-C  Capsaicin -Menthol -Methyl Sal (CAPSAICIN -METHYL SAL-MENTHOL ) 0.025-1-12 % CREA Apply 1 Application topically 2 (two) times daily as needed. 11/04/23   Elnor Jayson LABOR, DO  ferrous sulfate  325 (65 FE) MG EC tablet Take 1 tablet (325 mg total) by mouth at bedtime. 12/31/19   Zehr, Jessica D, PA-C  fluconazole  (DIFLUCAN ) 150 MG tablet Take 1 tablet (150 mg total) by mouth every other day. 03/15/21   Stuart Vernell Norris, PA-C  hyoscyamine  (LEVSIN  SL) 0.125 MG SL tablet Place 1 tablet (0.125 mg total) under the tongue every 12 (twelve) hours as needed for cramping. 02/24/20   Nandigam, Kavitha V, MD  lidocaine  (XYLOCAINE ) 2 % solution Use as directed 10 mLs in the mouth or throat as needed for mouth pain. 03/15/21   Stuart Vernell Norris, PA-C  ondansetron  (ZOFRAN -ODT) 4 MG disintegrating tablet Take 1 tablet (4 mg total) by mouth every  8 (eight) hours as needed for nausea or vomiting. 03/16/24   Roselyn Carlin NOVAK, MD  pantoprazole  (PROTONIX ) 20 MG tablet Take 1 tablet (20 mg total) by mouth daily for 14 days. 11/04/23 11/18/23  Elnor Jayson LABOR, DO  predniSONE  (DELTASONE ) 50 MG tablet 1 tablet daily with breakfast for 3 days 03/15/21   Stuart Vernell Norris, PA-C  sucralfate  (CARAFATE ) 1 g tablet Take 1 tablet (1 g total) by mouth with breakfast, with lunch, and with evening meal for 7 days. 11/04/23 11/11/23  Elnor Jayson LABOR, DO    Allergies: Patient has no known allergies.    Review of Systems  Gastrointestinal:  Positive for abdominal pain, nausea and vomiting.  All other systems reviewed and are negative.   Updated Vital Signs BP 115/66   Pulse 67   Temp 98 F (36.7 C) (Oral)   Resp 16   SpO2 100%   Physical Exam Vitals and nursing note reviewed.  Constitutional:      General: She is not in acute distress.    Appearance: She is well-developed.  HENT:     Head: Normocephalic and atraumatic.  Eyes:     Conjunctiva/sclera: Conjunctivae normal.  Cardiovascular:     Rate and Rhythm: Normal rate and regular rhythm.     Heart sounds: No murmur heard. Pulmonary:     Effort: Pulmonary effort is normal. No respiratory distress.     Breath sounds: Normal breath sounds.  Abdominal:     Palpations: Abdomen is  soft.     Tenderness: There is no abdominal tenderness.     Comments: Abdomen soft, compressible.  No tenderness noted.  Musculoskeletal:        General: No swelling.     Cervical back: Neck supple.  Skin:    General: Skin is warm and dry.     Capillary Refill: Capillary refill takes less than 2 seconds.  Neurological:     Mental Status: She is alert and oriented to person, place, and time. Mental status is at baseline.  Psychiatric:        Mood and Affect: Mood normal.     (all labs ordered are listed, but only abnormal results are displayed) Labs Reviewed  LIPASE, BLOOD - Abnormal; Notable for the  following components:      Result Value   Lipase 69 (*)    All other components within normal limits  COMPREHENSIVE METABOLIC PANEL WITH GFR - Abnormal; Notable for the following components:   Glucose, Bld 129 (*)    Calcium  11.0 (*)    Total Protein 8.9 (*)    Albumin 5.1 (*)    Anion gap 17 (*)    All other components within normal limits  CBC - Abnormal; Notable for the following components:   WBC 10.6 (*)    All other components within normal limits  URINALYSIS, ROUTINE W REFLEX MICROSCOPIC - Abnormal; Notable for the following components:   Hgb urine dipstick TRACE (*)    Ketones, ur >80 (*)    Protein, ur 100 (*)    Leukocytes,Ua SMALL (*)    All other components within normal limits  PREGNANCY, URINE    EKG: None  Radiology: CT ABDOMEN PELVIS W CONTRAST Result Date: 05/11/2024 CLINICAL DATA:  Acute abdominal pain, history of pancreatitis, initial encounter EXAM: CT ABDOMEN AND PELVIS WITH CONTRAST TECHNIQUE: Multidetector CT imaging of the abdomen and pelvis was performed using the standard protocol following bolus administration of intravenous contrast. RADIATION DOSE REDUCTION: This exam was performed according to the departmental dose-optimization program which includes automated exposure control, adjustment of the mA and/or kV according to patient size and/or use of iterative reconstruction technique. CONTRAST:  OMNIPAQUE  IOHEXOL  300 MG/ML  SOLN COMPARISON:  03/18/2023 FINDINGS: Lower chest: No acute abnormality. Hepatobiliary: No focal liver abnormality is seen. No gallstones, gallbladder wall thickening, or biliary dilatation. Pancreas: Unremarkable. No pancreatic ductal dilatation or surrounding inflammatory changes. Spleen: Normal in size without focal abnormality. Adrenals/Urinary Tract: Adrenal glands are within normal limits. Scattered renal calculi are noted bilaterally. No obstructive changes are seen. The bladder is decompressed. Stomach/Bowel: No obstructive or  inflammatory changes of the colon are noted. The appendix is within normal limits. Small bowel is within normal limits. Stomach is unremarkable. Vascular/Lymphatic: No significant vascular findings are present. No enlarged abdominal or pelvic lymph nodes. Reproductive: Uterus and bilateral adnexa are unremarkable. Involuting cyst is noted on the right. Tubal ligation clips are again noted. Other: No abdominal wall hernia or abnormality. No abdominopelvic ascites. Musculoskeletal: No acute or significant osseous findings. IMPRESSION: No evidence of pancreatitis. Scattered renal calculi without acute obstructive change. Small involuting cyst in the right ovary. Electronically Signed   By: Oneil Devonshire M.D.   On: 05/11/2024 20:54    Procedures   Medications Ordered in the ED  promethazine  (PHENERGAN ) 25 MG/ML injection (  Not Given 05/11/24 2029)  sodium chloride  0.9 % bolus 1,000 mL (0 mLs Intravenous Stopped 05/11/24 2145)  pantoprazole  (PROTONIX ) injection 40 mg (40 mg Intravenous  Given 05/11/24 1956)  promethazine  (PHENERGAN ) 12.5 mg in sodium chloride  0.9 % 50 mL IVPB (0 mg Intravenous Stopped 05/11/24 2112)  iohexol  (OMNIPAQUE ) 300 MG/ML solution 100 mL (100 mLs Intravenous Contrast Given 05/11/24 2027)     Medical Decision Making Amount and/or Complexity of Data Reviewed Labs: ordered. Radiology: ordered. ECG/medicine tests: ordered.  Risk Prescription drug management.   This is a 33 year old female presenting to the ED out of concern of abdominal pain and nausea and vomiting.  On exam, she is hemodynamically stable.  She is afebrile, nontachycardic.  Her lung sounds are clear bilaterally, she is not hypoxic.  Abdomen soft and compressible without tenderness.  Neurological examination at baseline.  Overall nontoxic in appearance.  Patient labs collected to include CBC, CMP, urinalysis, lipase, pregnancy test.  I have added on CT abdomen pelvis due to the fact that patient reports history of  pancreatitis.  Patient CBC here with slight leukocytosis to 10.6.  Metabolic panel without elevated creatinine, no elevated LFTs, no electrolyte derangement.  Patient anion gap is 17 which I suspect is secondary to dehydration.  Her lipase is 69.  Urinalysis shows ketones, small leukocytes, trace hemoglobin however patient denies any dysuria.  Pregnancy is negative.  CT scan unremarkable.  Patient given a liter of fluid, Phenergan , Protonix .  On reexamination, the patient reports that she is not nauseous.  She passed p.o. fluid challenge.  Patient will be discharged home at this time with Phenergan  prescription.  Advised to follow-up with PCP.  She had all of her questions answered to her satisfaction.  She is stable to discharge home.   Final diagnoses:  Nausea and vomiting, unspecified vomiting type  Generalized abdominal pain    ED Discharge Orders          Ordered    promethazine  (PHENERGAN ) 25 MG tablet  Every 6 hours PRN        05/11/24 2215               Ruthell Lonni FALCON, PA-C 05/11/24 2215    Armenta Canning, MD 05/16/24 1452

## 2024-08-24 ENCOUNTER — Ambulatory Visit (INDEPENDENT_AMBULATORY_CARE_PROVIDER_SITE_OTHER): Payer: Self-pay | Admitting: Gastroenterology

## 2024-08-24 ENCOUNTER — Encounter: Payer: Self-pay | Admitting: Gastroenterology

## 2024-08-24 VITALS — BP 132/74 | HR 91 | Ht 65.0 in | Wt 202.4 lb

## 2024-08-24 DIAGNOSIS — R1013 Epigastric pain: Secondary | ICD-10-CM

## 2024-08-24 DIAGNOSIS — K219 Gastro-esophageal reflux disease without esophagitis: Secondary | ICD-10-CM

## 2024-08-24 DIAGNOSIS — F1290 Cannabis use, unspecified, uncomplicated: Secondary | ICD-10-CM | POA: Insufficient documentation

## 2024-08-24 DIAGNOSIS — R1011 Right upper quadrant pain: Secondary | ICD-10-CM | POA: Insufficient documentation

## 2024-08-24 DIAGNOSIS — R112 Nausea with vomiting, unspecified: Secondary | ICD-10-CM

## 2024-08-24 DIAGNOSIS — K2970 Gastritis, unspecified, without bleeding: Secondary | ICD-10-CM

## 2024-08-24 MED ORDER — SUCRALFATE 1 GM/10ML PO SUSP: 1.0000 g | Freq: Four times a day (QID) | ORAL | 1 refills | Status: AC

## 2024-08-24 MED ORDER — PANTOPRAZOLE SODIUM 40 MG PO TBEC: 40.0000 mg | DELAYED_RELEASE_TABLET | Freq: Every day | ORAL | 3 refills | Status: AC

## 2024-08-24 NOTE — Patient Instructions (Signed)
 We have sent the following medications to your pharmacy for you to pick up at your convenience: Pantoprazole  40 mg daily 30-60 minutes before breakfast and dinner. Carafate  10 ml four times daily 20-30 minutes before meals and at bedtime.  Referral placed for [facility] to discuss gallbladder removal.   _______________________________________________________  If your blood pressure at your visit was 140/90 or greater, please contact your primary care physician to follow up on this.  _______________________________________________________  If you are age 106 or older, your body mass index should be between 23-30. Your Body mass index is 33.68 kg/m. If this is out of the aforementioned range listed, please consider follow up with your Primary Care Provider.  If you are age 7 or younger, your body mass index should be between 19-25. Your Body mass index is 33.68 kg/m. If this is out of the aformentioned range listed, please consider follow up with your Primary Care Provider.   ________________________________________________________  The [facility] providers would like to encourage you to use MYCHART to communicate with providers for non-urgent requests or questions.  Due to long hold times on the telephone, sending your provider a message by [facility] may be a faster and more efficient way to get a response.  Please allow 48 business hours for a response.  Please remember that this is for non-urgent requests.  _______________________________________________________  [facility] is using a team-based approach to care.  Your team is made up of your doctor and two to three APPS. Our APPS (Nurse Practitioners and Physician Assistants) work with your physician to ensure care continuity for you. They are fully qualified to address your health concerns and develop a treatment plan. They communicate directly with your gastroenterologist to care for you. Seeing the [facility] Practitioners on your physician's team can help you by facilitating care more promptly, often allowing for earlier appointments, access to diagnostic testing, procedures, and other specialty referrals.

## 2024-08-24 NOTE — Progress Notes (Signed)
 "    08/24/2024 [name] [name] [id number] 18-May-1991   Discussed the use of AI scribe software for clinical note transcription with the patient, who gave verbal consent to proceed.  History of Present Illness [name] [name] is a 33 year old female with recurrent pancreatitis who presents for evaluation of persistent episodic upper abdominal pain and vomiting.  She is a patient of Dr. Trenna.  Since at least 2021, she has experienced intermittent episodes of sudden-onset upper abdominal pain, occurring three to four times this year. Pain is described as sharp and stabbing, primarily in the epigastrium and sometimes right upper quadrant, and is often triggered by eating or drinking. Episodes are sometimes accompanied by vomiting, which can persist for days and occasionally lasts more than ten days without hospital care. Between episodes, she remains asymptomatic for months at a time.  The most recent flare occurred last week, resulting in hospitalization at [facility]. Additional episodes were noted in September and possibly in February. Pain can also occur when hungry or after overeating. She currently denies pain at rest and is able to eat, primarily consuming hibachi daily.  Multiple workups, including CT scans in September and last week, have been performed. Pancreas appeared normal. Lipase was elevated during the last episode, however. Non-obstructing kidney stones have been identified on imaging. She prefers to avoid further lab testing today unless necessary.  Referred to surgeons previously for consideration of cholecystectomy, but she never saw them.  She does not consume alcohol. 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 Current medications include pantoprazole  20 mg daily, which she finds helpful and wishes to continue, and promethazine  for nausea and pain. Ondansetron  was ineffective. She recently received Carafate  in the hospital and is interested in continuing  it.  Has had multiple emergency department visits for same.  Has had extensive workup including CT, ultrasound, HIDA, and endoscopy with no definite cause.    CT scan in 04/2024:  IMPRESSION: No evidence of pancreatitis.   Scattered renal calculi without acute obstructive change.   Small involuting cyst in the right ovary.  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 pylori -Flattened mucosa in the duodenum, biopsied - negative celiac  Results Labs Lipase (08/14/2024): Elevated at 399 LFTs were normal  Radiology CT abdomen and pelvis (08/14/2024): Normal pancreas, non-obstructing nephrolithiasis, wall thickening of the distal esophagus CT abdomen and pelvis (04/2024): Normal pancreas    Past Medical History:  Diagnosis Date   Allergy    Anemia    Chlamydia    06/15/14 w/tx, 10/11/14 w/tx, TOC-neg x2   Eczema    GERD (gastroesophageal reflux disease)    Gonorrhea 06/15/14   w/tx, TOC- neg   History of anemia    Infection    chlamydia & gonorrhea   Trichomonas    Past Surgical History:  Procedure Laterality Date   fractured finger as a child     LAPAROSCOPIC TUBAL LIGATION Bilateral 03/16/2015   Procedure: LAPAROSCOPIC TUBAL LIGATION WITH FILSHIE CLIPS;  Surgeon: Gloris DELENA Hugger, MD;  Location: [facility];  Service: Gynecology;  Laterality: Bilateral;  With Filshie clips  Requested 03/16/15 @ 1:00p   UPPER GASTROINTESTINAL ENDOSCOPY  02/24/2020   WISDOM TOOTH EXTRACTION      reports that she has never smoked. She has never used smokeless tobacco. She reports current drug use. She reports that she does not drink alcohol. family  history includes Asthma in her sister; Heart disease in her maternal grandmother. Allergies[1]    Outpatient Encounter Medications as of 08/24/2024  Medication Sig   pantoprazole  (PROTONIX ) 20 MG tablet Take 1 tablet (20 mg  total) by mouth daily for 14 days.   promethazine  (PHENERGAN ) 25 MG tablet Take 1 tablet (25 mg total) by mouth every 6 (six) hours as needed for nausea or vomiting.   Capsaicin -Menthol -Methyl Sal (CAPSAICIN -METHYL SAL-MENTHOL ) 0.025-1-12 % CREA Apply 1 Application topically 2 (two) times daily as needed. (Patient not taking: Reported on 08/24/2024)   ferrous sulfate  325 (65 FE) MG EC tablet Take 1 tablet (325 mg total) by mouth at bedtime. (Patient not taking: Reported on 08/24/2024)   fluconazole  (DIFLUCAN ) 150 MG tablet Take 1 tablet (150 mg total) by mouth every other day. (Patient not taking: Reported on 08/24/2024)   hyoscyamine  (LEVSIN  SL) 0.125 MG SL tablet Place 1 tablet (0.125 mg total) under the tongue every 12 (twelve) hours as needed for cramping. (Patient not taking: Reported on 08/24/2024)   lidocaine  (XYLOCAINE ) 2 % solution Use as directed 10 mLs in the mouth or throat as needed for mouth pain. (Patient not taking: Reported on 08/24/2024)   ondansetron  (ZOFRAN -ODT) 4 MG disintegrating tablet Take 1 tablet (4 mg total) by mouth every 8 (eight) hours as needed for nausea or vomiting. (Patient not taking: Reported on 08/24/2024)   predniSONE  (DELTASONE ) 50 MG tablet 1 tablet daily with breakfast for 3 days (Patient not taking: Reported on 08/24/2024)   sucralfate  (CARAFATE ) 1 g tablet Take 1 tablet (1 g total) by mouth with breakfast, with lunch, and with evening meal for 7 days. (Patient not taking: Reported on 08/24/2024)   No facility-administered encounter medications on file as of 08/24/2024.     REVIEW OF SYSTEMS  : All other systems reviewed and negative except where noted in the History of Present Illness.   PHYSICAL EXAM: BP 132/74   Pulse 91   Ht 5' 5 (1.651 m)   Wt 202 lb 6 oz (91.8 kg)   BMI 33.68 kg/m  General: Well developed AA female in no acute distress Head: Normocephalic and atraumatic Eyes:  Sclerae anicteric, conjunctiva pink. Ears: Normal auditory  acuity Lungs: Clear throughout to auscultation; no W/R/R. Heart: Regular rate and rhythm; no M/R/G. Abdomen: Soft, non-distended.  BS present.  Minimal epigastric TTP. Musculoskeletal: Symmetrical with no gross deformities  Skin: No lesions on visible extremities Extremities: No edema  Neurological: Alert oriented x 4, grossly non-focal Psychological:  Alert and cooperative. Normal mood and affect  Assessment & Plan Recurrent abdominal pain and vomiting with history of pancreatitis She experiences intermittent, severe flares of abdominal pain and vomiting with months of remission. Differential includes intermittent biliary colic due to microlithiasis or sludge and cannabinoid hyperemesis syndrome. Recent imaging and laboratory studies are unremarkable, and she remains asymptomatic between episodes. - Referred to general surgery for evaluation of possible cholecystectomy.  She had been referred on a couple of occasions in the past but never saw them. - Needs consideration of marijuana use cessation for 2 to 3 months.  Gastroesophageal reflux and gastritis She has chronic daily upper abdominal pain with acid-related symptoms, responsive to acid suppression and mucosal protectants. No evidence of acute complications on recent imaging.  CT scan suggests esophagitis, may be secondary to the vomiting that she was experiencing. - Increased pantoprazole  to 40 mg daily; instructed her to use two 20 mg tablets until new prescription is filled.   - Prescribed  sucralfate  suspension ACHS for esophageal and gastric irritation, especially during flares.  Nausea She experiences recurrent nausea and vomiting during flares, previously responsive to promethazine . Currently asymptomatic but requests options for future episodes.  Zofran  previously not helpful and she has a prolonged QT interval so should try to avoid Zofran  and Reglan .   CC:  No ref. provider found        [1] No Known Allergies  "
